# Patient Record
Sex: Female | Born: 1950 | Race: Black or African American | Hispanic: No | Marital: Married | Smoking: Current every day smoker
Health system: Southern US, Community
[De-identification: ages and names within clinical notes are randomized; demographics above are authoritative.]

## PROBLEM LIST (undated history)

## (undated) DIAGNOSIS — J309 Allergic rhinitis, unspecified: Secondary | ICD-10-CM

## (undated) DIAGNOSIS — K219 Gastro-esophageal reflux disease without esophagitis: Secondary | ICD-10-CM

## (undated) DIAGNOSIS — Z720 Tobacco use: Secondary | ICD-10-CM

## (undated) DIAGNOSIS — E785 Hyperlipidemia, unspecified: Secondary | ICD-10-CM

## (undated) DIAGNOSIS — E669 Obesity, unspecified: Secondary | ICD-10-CM

## (undated) DIAGNOSIS — J45909 Unspecified asthma, uncomplicated: Secondary | ICD-10-CM

## (undated) DIAGNOSIS — G5600 Carpal tunnel syndrome, unspecified upper limb: Secondary | ICD-10-CM

## (undated) DIAGNOSIS — I10 Essential (primary) hypertension: Secondary | ICD-10-CM

## (undated) DIAGNOSIS — D696 Thrombocytopenia, unspecified: Secondary | ICD-10-CM

## (undated) HISTORY — DX: Carpal tunnel syndrome, unspecified upper limb: G56.00

## (undated) HISTORY — PX: TUBAL LIGATION: SHX77

## (undated) HISTORY — DX: Tobacco use: Z72.0

## (undated) HISTORY — DX: Obesity, unspecified: E66.9

## (undated) HISTORY — DX: Essential (primary) hypertension: I10

## (undated) HISTORY — DX: Gastro-esophageal reflux disease without esophagitis: K21.9

## (undated) HISTORY — DX: Thrombocytopenia, unspecified: D69.6

## (undated) HISTORY — PX: OTHER SURGICAL HISTORY: SHX169

## (undated) HISTORY — DX: Allergic rhinitis, unspecified: J30.9

## (undated) HISTORY — DX: Hyperlipidemia, unspecified: E78.5

## (undated) HISTORY — DX: Unspecified asthma, uncomplicated: J45.909

---

## 2004-03-01 ENCOUNTER — Ambulatory Visit: Payer: Self-pay | Admitting: Unknown Physician Specialty

## 2004-03-23 ENCOUNTER — Ambulatory Visit: Payer: Self-pay | Admitting: Unknown Physician Specialty

## 2008-09-28 ENCOUNTER — Ambulatory Visit: Payer: Self-pay | Admitting: Unknown Physician Specialty

## 2009-01-26 ENCOUNTER — Emergency Department: Payer: Self-pay | Admitting: Internal Medicine

## 2010-07-13 LAB — CBC AND DIFFERENTIAL
Neutrophils Absolute: 37 /uL
Platelets: 143 10*3/uL — AB (ref 150–399)

## 2010-07-13 LAB — BASIC METABOLIC PANEL: Glucose: 94 mg/dL

## 2010-07-13 LAB — LIPID PANEL
Cholesterol: 194 mg/dL (ref 0–200)
HDL: 46 mg/dL (ref 35–70)
LDL Cholesterol: 127 mg/dL

## 2010-07-13 LAB — TSH: TSH: 1.58 u[IU]/mL (ref 0.41–5.90)

## 2010-08-31 ENCOUNTER — Ambulatory Visit: Payer: Self-pay | Admitting: Unknown Physician Specialty

## 2010-08-31 LAB — HM MAMMOGRAPHY

## 2010-12-12 ENCOUNTER — Ambulatory Visit: Payer: Self-pay | Admitting: Unknown Physician Specialty

## 2010-12-12 LAB — HM COLONOSCOPY

## 2011-05-30 ENCOUNTER — Encounter: Payer: Self-pay | Admitting: Internal Medicine

## 2011-05-30 DIAGNOSIS — Z720 Tobacco use: Secondary | ICD-10-CM

## 2011-05-30 DIAGNOSIS — I10 Essential (primary) hypertension: Secondary | ICD-10-CM

## 2011-05-30 DIAGNOSIS — E785 Hyperlipidemia, unspecified: Secondary | ICD-10-CM

## 2011-05-30 DIAGNOSIS — M722 Plantar fascial fibromatosis: Secondary | ICD-10-CM

## 2011-05-30 DIAGNOSIS — E669 Obesity, unspecified: Secondary | ICD-10-CM

## 2011-05-30 DIAGNOSIS — B351 Tinea unguium: Secondary | ICD-10-CM

## 2011-05-30 HISTORY — DX: Hyperlipidemia, unspecified: E78.5

## 2011-08-02 ENCOUNTER — Ambulatory Visit (INDEPENDENT_AMBULATORY_CARE_PROVIDER_SITE_OTHER): Payer: BC Managed Care – PPO | Admitting: Internal Medicine

## 2011-08-02 ENCOUNTER — Encounter: Payer: Self-pay | Admitting: Internal Medicine

## 2011-08-02 VITALS — BP 138/78 | HR 92 | Temp 98.3°F | Wt 239.0 lb

## 2011-08-02 DIAGNOSIS — I10 Essential (primary) hypertension: Secondary | ICD-10-CM | POA: Insufficient documentation

## 2011-08-02 DIAGNOSIS — L259 Unspecified contact dermatitis, unspecified cause: Secondary | ICD-10-CM

## 2011-08-02 DIAGNOSIS — L309 Dermatitis, unspecified: Secondary | ICD-10-CM | POA: Insufficient documentation

## 2011-08-02 DIAGNOSIS — Z720 Tobacco use: Secondary | ICD-10-CM | POA: Insufficient documentation

## 2011-08-02 DIAGNOSIS — E785 Hyperlipidemia, unspecified: Secondary | ICD-10-CM

## 2011-08-02 DIAGNOSIS — F17200 Nicotine dependence, unspecified, uncomplicated: Secondary | ICD-10-CM

## 2011-08-02 DIAGNOSIS — Z0000 Encounter for general adult medical examination without abnormal findings: Secondary | ICD-10-CM

## 2011-08-02 LAB — CBC WITH DIFFERENTIAL/PLATELET
Basophils Absolute: 0 10*3/uL (ref 0.0–0.1)
Basophils Relative: 0.7 % (ref 0.0–3.0)
Eosinophils Absolute: 0.3 10*3/uL (ref 0.0–0.7)
Hemoglobin: 13.9 g/dL (ref 12.0–15.0)
Lymphocytes Relative: 33.3 % (ref 12.0–46.0)
MCHC: 32.3 g/dL (ref 30.0–36.0)
Monocytes Relative: 10.4 % (ref 3.0–12.0)
Neutro Abs: 2.5 10*3/uL (ref 1.4–7.7)
Neutrophils Relative %: 50.2 % (ref 43.0–77.0)
RBC: 5.01 Mil/uL (ref 3.87–5.11)

## 2011-08-02 LAB — POCT URINALYSIS DIPSTICK
Bilirubin, UA: NEGATIVE
Blood, UA: NEGATIVE
Ketones, UA: NEGATIVE
Protein, UA: NEGATIVE
pH, UA: 6

## 2011-08-02 LAB — COMPREHENSIVE METABOLIC PANEL
AST: 36 U/L (ref 0–37)
Albumin: 4 g/dL (ref 3.5–5.2)
BUN: 15 mg/dL (ref 6–23)
CO2: 24 mEq/L (ref 19–32)
Calcium: 9.6 mg/dL (ref 8.4–10.5)
Chloride: 108 mEq/L (ref 96–112)
Creatinine, Ser: 0.9 mg/dL (ref 0.4–1.2)
GFR: 83.57 mL/min (ref >60.00)
Glucose, Bld: 92 mg/dL (ref 70–99)
Potassium: 4.1 mEq/L (ref 3.5–5.1)

## 2011-08-02 LAB — LIPID PANEL
HDL: 61.4 mg/dL (ref >39.00)
Triglycerides: 94 mg/dL (ref 0.0–149.0)
VLDL: 18.8 mg/dL (ref 0.0–40.0)

## 2011-08-02 LAB — TSH: TSH: 1.61 u[IU]/mL (ref 0.35–5.50)

## 2011-08-02 MED ORDER — BUPROPION HCL ER (XL) 150 MG PO TB24: 150.0000 mg | ORAL_TABLET | Freq: Every day | ORAL | Status: DC

## 2011-08-02 MED ORDER — TRIAMCINOLONE ACETONIDE 0.1 % EX CREA: TOPICAL_CREAM | Freq: Two times a day (BID) | CUTANEOUS | Status: DC

## 2011-08-02 NOTE — Progress Notes (Signed)
Addended by: Jobie Quaker on: 08/02/2011 04:32 PM   Modules accepted: Orders

## 2011-08-02 NOTE — Assessment & Plan Note (Signed)
Noted in past by previous provider. Will check lipids and LFTs today.

## 2011-08-02 NOTE — Assessment & Plan Note (Signed)
Located over bilateral arms, associated with use of new lotion. Will stop lotion and use triamcinolone cream bid for next week to see if any improvement. Pt may also use Benadryl at night for itching. She will call if symptoms not improved.

## 2011-08-02 NOTE — Assessment & Plan Note (Signed)
BP well controlled today. Will check renal function and urine microalbumin with labs. Follow up BP in 6 months.

## 2011-08-02 NOTE — Progress Notes (Signed)
Subjective:    Patient ID: Bethany Lopez, female    DOB: Sep 28, 1950, 61 y.o.   MRN: [id number]  HPI 61 year old female with history of hypertension, allergic rhinitis, and tobacco use presents to establish care. In regards to her hypertension, she reports that she recently discontinued her triamterene-hctz because she felt that this medication increased swelling in her lower extremities. However, she has continued taking amlodipine. She denies any noted side effects from this medicine. She denies any chest pain, headache. She has been checking her blood pressure but feels that her home blood pressure cuff is inaccurate as it has been giving her some variable readings.  She notes that she has been trying to cut back on use of cigarettes. She is currently smoking less than half a pack of cigarettes per day. She notes that she continues to use cigarettes to help with anxiety or stress. She is interested in learning new ways to stop smoking.  She is concerned today about a rash that she developed approximately one week ago over her arms and upper torso. She notes that she has been using a new lotion and these areas in questions if this lotion may be contributing to the rash the rash is described as red and bumpy. It is itchy. She has not been applying anything to the rash or taking any medications by mouth for this. She denies any fever, chills, or rashes other locations.  Outpatient Encounter Prescriptions as of 08/02/2011  Medication Sig Dispense Refill  . amLODipine (NORVASC) 5 MG tablet Take 5 mg by mouth daily.      . fluticasone (FLONASE) 50 MCG/ACT nasal spray Place 1 spray into the nose 2 (two) times daily.      Marland Kitchen buPROPion (WELLBUTRIN XL) 150 MG 24 hr tablet Take 1 tablet (150 mg total) by mouth daily.  30 tablet  3  . triamcinolone cream (KENALOG) 0.1 % Apply topically 2 (two) times daily.  30 g  0  . DISCONTD: triamterene-hydrochlorothiazide (DYAZIDE) 37.5-25 MG per capsule Take 1 capsule by  mouth every morning.        Review of Systems  Constitutional: Negative for fever, chills, appetite change, fatigue and unexpected weight change.  HENT: Negative for ear pain, congestion, sore throat, trouble swallowing, neck pain, voice change and sinus pressure.   Eyes: Negative for visual disturbance.  Respiratory: Negative for cough, shortness of breath, wheezing and stridor.   Cardiovascular: Negative for chest pain, palpitations and leg swelling.  Gastrointestinal: Negative for nausea, vomiting, abdominal pain, diarrhea, constipation, blood in stool, abdominal distention and anal bleeding.  Genitourinary: Negative for dysuria and flank pain.  Musculoskeletal: Negative for myalgias, arthralgias and gait problem.  Skin: Positive for color change and rash.  Neurological: Negative for dizziness and headaches.  Hematological: Negative for adenopathy. Does not bruise/bleed easily.  Psychiatric/Behavioral: Negative for suicidal ideas, sleep disturbance and dysphoric mood. The patient is not nervous/anxious.    BP 138/78  Pulse 92  Temp(Src) 98.3 F (36.8 C) (Oral)  Wt 239 lb (108.41 kg)  SpO2 98%     Objective:   Physical Exam  Constitutional: She is oriented to person, place, and time. She appears well-developed and well-nourished. No distress.  HENT:  Head: Normocephalic and atraumatic.  Right Ear: External ear normal.  Left Ear: External ear normal.  Nose: Nose normal.  Mouth/Throat: Oropharynx is clear and moist. No oropharyngeal exudate.  Eyes: Conjunctivae are normal. Pupils are equal, round, and reactive to light. Right eye exhibits no discharge. Left  eye exhibits no discharge. No scleral icterus.  Neck: Normal range of motion. Neck supple. No tracheal deviation present. No thyromegaly present.  Cardiovascular: Normal rate, regular rhythm, normal heart sounds and intact distal pulses.  Exam reveals no gallop and no friction rub.   No murmur heard. Pulmonary/Chest: Effort  normal and breath sounds normal. No respiratory distress. She has no wheezes. She has no rales. She exhibits no tenderness.  Abdominal: Soft. Bowel sounds are normal. She exhibits no distension. There is no tenderness.  Musculoskeletal: Normal range of motion. She exhibits no edema and no tenderness.  Lymphadenopathy:    She has no cervical adenopathy.  Neurological: She is alert and oriented to person, place, and time. No cranial nerve deficit. She exhibits normal muscle tone. Coordination normal.  Skin: Skin is warm and dry. Rash (over bilateral arms, upper back and upper anterior chest) noted. Rash is papular. She is not diaphoretic. No erythema. No pallor.  Psychiatric: She has a normal mood and affect. Her behavior is normal. Judgment and thought content normal.          Assessment & Plan:

## 2011-08-02 NOTE — Assessment & Plan Note (Signed)
Pt has been reducing number of cigarettes used daily. Will add wellbutrin and set quit date in 1 week.  Will use nicotine gum as needed. Follow up 1 month.

## 2011-08-03 ENCOUNTER — Other Ambulatory Visit: Payer: Self-pay | Admitting: *Deleted

## 2011-08-03 MED ORDER — ATORVASTATIN CALCIUM 20 MG PO TABS: 20.0000 mg | ORAL_TABLET | Freq: Every day | ORAL | Status: DC

## 2011-08-23 ENCOUNTER — Telehealth: Payer: Self-pay | Admitting: Internal Medicine

## 2011-08-23 DIAGNOSIS — L309 Dermatitis, unspecified: Secondary | ICD-10-CM

## 2011-08-23 MED ORDER — TRIAMCINOLONE ACETONIDE 0.1 % EX CREA: TOPICAL_CREAM | Freq: Two times a day (BID) | CUTANEOUS | Status: DC

## 2011-08-23 NOTE — Telephone Encounter (Signed)
Done

## 2011-08-23 NOTE — Telephone Encounter (Signed)
[phone] Pt would like refill on triamcinolone cream [facility] @ [address]

## 2011-09-06 ENCOUNTER — Other Ambulatory Visit: Payer: BC Managed Care – PPO

## 2011-09-07 ENCOUNTER — Other Ambulatory Visit: Payer: Self-pay | Admitting: *Deleted

## 2011-09-07 ENCOUNTER — Other Ambulatory Visit (INDEPENDENT_AMBULATORY_CARE_PROVIDER_SITE_OTHER): Payer: BC Managed Care – PPO

## 2011-09-07 DIAGNOSIS — I10 Essential (primary) hypertension: Secondary | ICD-10-CM

## 2011-09-07 DIAGNOSIS — E785 Hyperlipidemia, unspecified: Secondary | ICD-10-CM

## 2011-09-07 LAB — COMPREHENSIVE METABOLIC PANEL
ALT: 33 U/L (ref 0–35)
Albumin: 3.6 g/dL (ref 3.5–5.2)
Alkaline Phosphatase: 49 U/L (ref 39–117)
CO2: 24 mEq/L (ref 19–32)
Potassium: 4.5 mEq/L (ref 3.5–5.1)
Sodium: 139 mEq/L (ref 135–145)
Total Bilirubin: 0.4 mg/dL (ref 0.3–1.2)
Total Protein: 6.7 g/dL (ref 6.0–8.3)

## 2011-09-07 LAB — CBC WITH DIFFERENTIAL/PLATELET
Basophils Relative: 0.1 % (ref 0.0–3.0)
Eosinophils Absolute: 0.3 10*3/uL (ref 0.0–0.7)
Hemoglobin: 13.8 g/dL (ref 12.0–15.0)
Lymphs Abs: 1.8 10*3/uL (ref 0.7–4.0)
MCHC: 31.7 g/dL (ref 30.0–36.0)
MCV: 83.9 fl (ref 78.0–100.0)
Monocytes Absolute: 0.4 10*3/uL (ref 0.1–1.0)
Neutro Abs: 2.4 10*3/uL (ref 1.4–7.7)
RBC: 5.19 Mil/uL — ABNORMAL HIGH (ref 3.87–5.11)
RDW: 14.6 % (ref 11.5–14.6)

## 2011-09-07 LAB — LIPID PANEL
HDL: 61.7 mg/dL (ref >39.00)
Total CHOL/HDL Ratio: 2

## 2011-09-07 NOTE — Telephone Encounter (Signed)
Informed patient/SLS 

## 2011-09-07 NOTE — Telephone Encounter (Signed)
There is not really a stronger topical steroid that would be safe for her to use over her arms for the rash.  If the rash is persistent, then we should set her up with dermatology

## 2011-09-07 NOTE — Telephone Encounter (Signed)
Fyi pt states the pain in her side has gotten better, it felt more like a pulled muscle and it a lot better. The rash is slowly going away pt is on her second refill of med, she wants to know if she can get a stronger dose. The med does help but it is taking a while.

## 2011-10-24 ENCOUNTER — Other Ambulatory Visit: Payer: Self-pay | Admitting: Internal Medicine

## 2011-12-01 ENCOUNTER — Encounter: Payer: Self-pay | Admitting: Internal Medicine

## 2011-12-01 DIAGNOSIS — Z0001 Encounter for general adult medical examination with abnormal findings: Secondary | ICD-10-CM | POA: Insufficient documentation

## 2011-12-06 ENCOUNTER — Encounter: Payer: Self-pay | Admitting: Internal Medicine

## 2011-12-06 ENCOUNTER — Other Ambulatory Visit (INDEPENDENT_AMBULATORY_CARE_PROVIDER_SITE_OTHER): Payer: BC Managed Care – PPO

## 2011-12-06 ENCOUNTER — Ambulatory Visit (INDEPENDENT_AMBULATORY_CARE_PROVIDER_SITE_OTHER): Payer: BC Managed Care – PPO | Admitting: Internal Medicine

## 2011-12-06 VITALS — BP 140/80 | HR 82 | Temp 97.4°F | Ht 65.0 in | Wt 238.5 lb

## 2011-12-06 DIAGNOSIS — F17200 Nicotine dependence, unspecified, uncomplicated: Secondary | ICD-10-CM

## 2011-12-06 DIAGNOSIS — I10 Essential (primary) hypertension: Secondary | ICD-10-CM

## 2011-12-06 DIAGNOSIS — Z23 Encounter for immunization: Secondary | ICD-10-CM

## 2011-12-06 DIAGNOSIS — L259 Unspecified contact dermatitis, unspecified cause: Secondary | ICD-10-CM

## 2011-12-06 DIAGNOSIS — Z0000 Encounter for general adult medical examination without abnormal findings: Secondary | ICD-10-CM

## 2011-12-06 DIAGNOSIS — Z720 Tobacco use: Secondary | ICD-10-CM

## 2011-12-06 DIAGNOSIS — L309 Dermatitis, unspecified: Secondary | ICD-10-CM

## 2011-12-06 DIAGNOSIS — R21 Rash and other nonspecific skin eruption: Secondary | ICD-10-CM

## 2011-12-06 LAB — CBC WITH DIFFERENTIAL/PLATELET
Basophils Absolute: 0 10*3/uL (ref 0.0–0.1)
Eosinophils Absolute: 0.5 10*3/uL (ref 0.0–0.7)
Lymphocytes Relative: 33.3 % (ref 12.0–46.0)
MCHC: 31.3 g/dL (ref 30.0–36.0)
Monocytes Absolute: 0.6 10*3/uL (ref 0.1–1.0)
Neutrophils Relative %: 45 % (ref 43.0–77.0)
Platelets: 149 10*3/uL — ABNORMAL LOW (ref 150.0–400.0)
RBC: 4.2 Mil/uL (ref 3.87–5.11)
RDW: 16.4 % — ABNORMAL HIGH (ref 11.5–14.6)

## 2011-12-06 LAB — URINALYSIS, ROUTINE W REFLEX MICROSCOPIC
Bilirubin Urine: NEGATIVE
Hgb urine dipstick: NEGATIVE
Nitrite: NEGATIVE
Urobilinogen, UA: 0.2 (ref 0.0–1.0)

## 2011-12-06 LAB — BASIC METABOLIC PANEL
CO2: 27 mEq/L (ref 19–32)
Chloride: 106 mEq/L (ref 96–112)
Sodium: 138 mEq/L (ref 135–145)

## 2011-12-06 LAB — HEPATIC FUNCTION PANEL
ALT: 30 U/L (ref 0–35)
AST: 29 U/L (ref 0–37)
Alkaline Phosphatase: 49 U/L (ref 39–117)
Bilirubin, Direct: 0.1 mg/dL (ref 0.0–0.3)
Total Protein: 6.5 g/dL (ref 6.0–8.3)

## 2011-12-06 LAB — LIPID PANEL: Total CHOL/HDL Ratio: 2

## 2011-12-06 LAB — TSH: TSH: 1.05 u[IU]/mL (ref 0.35–5.50)

## 2011-12-06 MED ORDER — TETANUS-DIPHTH-ACELL PERTUSSIS 5-2.5-18.5 LF-MCG/0.5 IM SUSP: 0.5000 mL | Freq: Once | INTRAMUSCULAR | Status: DC

## 2011-12-06 MED ORDER — TRIAMCINOLONE ACETONIDE 0.1 % EX CREA: TOPICAL_CREAM | Freq: Two times a day (BID) | CUTANEOUS | Status: DC

## 2011-12-06 NOTE — Assessment & Plan Note (Signed)

## 2011-12-06 NOTE — Patient Instructions (Addendum)
Take all new medications as prescribed - the cream You had the tetanus shot today Please go to LAB in the Basement for the blood and/or urine tests to be done today You will be contacted by phone if any changes need to be made immediately.  Otherwise, you will receive a letter about your results with an explanation. Please continue your efforts at being more active, low cholesterol diet, and weight control. Please stop smoking Please have the pharmacy call with any refills you may need. Please return in 1 year for your yearly visit, or sooner if needed, with Lab testing done 3-5 days before

## 2011-12-06 NOTE — Assessment & Plan Note (Signed)
Urged to quit, not ready, declines chantix or similar ar this time

## 2011-12-06 NOTE — Assessment & Plan Note (Signed)
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

## 2011-12-06 NOTE — Assessment & Plan Note (Signed)
stable overall by hx and exam, most recent data reviewed with pt, and pt to continue medical treatment as before BP Readings from Last 3 Encounters:  12/06/11 140/80  08/02/11 138/78  03/29/11 150/90

## 2011-12-06 NOTE — Progress Notes (Signed)
Subjective:    Patient ID: Bethany Lopez, female    DOB: November 12, 1950, 61 y.o.   MRN: [id number]  HPI  Here for wellness and to establish as new pt;  Overall doing ok;  Pt denies CP, worsening SOB, DOE, wheezing, orthopnea, PND, worsening LE edema, palpitations, dizziness or syncope.  Pt denies neurological change such as new Headache, facial or extremity weakness.  Pt denies polydipsia, polyuria, or low sugar symptoms. Pt states overall good compliance with treatment and medications, good tolerability, and trying to follow lower cholesterol diet.  Pt denies worsening depressive symptoms, suicidal ideation or panic. No fever, wt loss, night sweats, loss of appetite, or other constitutional symptoms.  Pt states good ability with ADL's, low fall risk, home safety reviewed and adequate, no significant changes in hearing or vision, and occasionally active with exercise - plans to try to do better.  Currently working at [employer], no plans to retire soon.  Due for tetanus and routine labs. 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 Past Medical History  Diagnosis Date  . Hypertension   . Obesity   . Tobacco abuse   . Hyperlipidemia 05/30/2011    NEC/NOS    . Asthma    Past Surgical History  Procedure Date  . Arthroscopic right knee     5-6 yrs ago  . Tubal ligation     reports that she has been smoking Cigarettes.  She has been smoking about .5 packs per day. She has never used smokeless tobacco. She reports that she drinks alcohol. She reports that she does not use illicit drugs. family history includes Diabetes in her brother, mother, and sister; Heart disease in her father; and Kidney disease in her mother. Allergies  Allergen Reactions  . Lodine (Etodolac) Other (See Comments)    GI upset   Current Outpatient Prescriptions on File Prior to Visit  Medication Sig Dispense Refill  .  amLODipine (NORVASC) 5 MG tablet Take 5 mg by mouth daily.      Marland Kitchen atorvastatin (LIPITOR) 20 MG tablet TAKE ONE TABLET BY MOUTH EVERY DAY  30 tablet  3  . buPROPion (WELLBUTRIN XL) 150 MG 24 hr tablet Take 1 tablet (150 mg total) by mouth daily.  30 tablet  3  . fluticasone (FLONASE) 50 MCG/ACT nasal spray Place 1 spray into the nose 2 (two) times daily.       No current facility-administered medications on file prior to visit.   Review of Systems Review of Systems  Constitutional: Negative for diaphoresis, activity change, appetite change and unexpected weight change.  HENT: Negative for hearing loss, ear pain, facial swelling, mouth sores and neck stiffness.   Eyes: Negative for pain, redness and visual disturbance.  Respiratory: Negative for shortness of breath and wheezing.   Cardiovascular: Negative for chest pain and palpitations.  Gastrointestinal: Negative for diarrhea, blood in stool, abdominal distention and rectal pain.  Genitourinary: Negative for hematuria, flank pain and decreased urine volume.  Musculoskeletal: Negative for myalgias and joint swelling.  Skin: Negative for color change and wound.  Neurological: Negative for syncope and numbness.  Hematological: Negative for adenopathy.  Psychiatric/Behavioral: Negative for hallucinations, self-injury, decreased concentration and agitation.      Objective:   Physical Exam BP 140/80  Pulse 82  Temp 97.4 F (36.3 C) (Oral)  Ht 5\' 5"  (1.651 m)  Wt 238 lb 8 oz (108.183  kg)  BMI 39.69 kg/m2  SpO2 98% Physical Exam  VS noted Constitutional: Pt is oriented to person, place, and time. Appears well-developed and well-nourished.  HENT:  Head: Normocephalic and atraumatic.  Right Ear: External ear normal.  Left Ear: External ear normal.  Nose: Nose normal.  Mouth/Throat: Oropharynx is clear and moist.  Eyes: Conjunctivae and EOM are normal. Pupils are equal, round, and reactive to light.  Neck: Normal range of motion. Neck  supple. No JVD present. No tracheal deviation present.  Cardiovascular: Normal rate, regular rhythm, normal heart sounds and intact distal pulses.   Pulmonary/Chest: Effort normal and breath sounds normal.  Abdominal: Soft. Bowel sounds are normal. There is no tenderness.  Musculoskeletal: Normal range of motion. Exhibits no edema.  Lymphadenopathy:  Has no cervical adenopathy.  Neurological: Pt is alert and oriented to person, place, and time. Pt has normal reflexes. No cranial nerve deficit.  Skin: Skin is warm and dry. No rash noted. excpet for minor tender erythema bumpy rash to the extensor surfaces both arms Psychiatric:  Has  normal mood and affect. Behavior is normal.     Assessment & Plan:

## 2011-12-07 ENCOUNTER — Encounter: Payer: Self-pay | Admitting: Internal Medicine

## 2011-12-07 LAB — IBC PANEL: Saturation Ratios: 20.5 % (ref 20.0–50.0)

## 2011-12-10 ENCOUNTER — Encounter: Payer: Self-pay | Admitting: Internal Medicine

## 2011-12-21 ENCOUNTER — Other Ambulatory Visit: Payer: Self-pay | Admitting: Internal Medicine

## 2012-02-28 ENCOUNTER — Other Ambulatory Visit: Payer: Self-pay | Admitting: Internal Medicine

## 2012-03-01 ENCOUNTER — Other Ambulatory Visit: Payer: Self-pay | Admitting: *Deleted

## 2012-03-01 MED ORDER — ATORVASTATIN CALCIUM 20 MG PO TABS: ORAL_TABLET | ORAL | Status: DC

## 2012-03-01 NOTE — Telephone Encounter (Signed)
R'cd fax from [facility] pharmacy for refill of Liptor.

## 2012-03-22 ENCOUNTER — Other Ambulatory Visit: Payer: Self-pay

## 2012-03-22 MED ORDER — AMLODIPINE BESYLATE 5 MG PO TABS: 5.0000 mg | ORAL_TABLET | Freq: Every day | ORAL | Status: DC

## 2012-04-25 ENCOUNTER — Ambulatory Visit: Payer: Self-pay | Admitting: Internal Medicine

## 2012-04-30 ENCOUNTER — Ambulatory Visit (INDEPENDENT_AMBULATORY_CARE_PROVIDER_SITE_OTHER): Payer: BC Managed Care – PPO | Admitting: Internal Medicine

## 2012-04-30 ENCOUNTER — Encounter: Payer: Self-pay | Admitting: Internal Medicine

## 2012-04-30 ENCOUNTER — Other Ambulatory Visit (INDEPENDENT_AMBULATORY_CARE_PROVIDER_SITE_OTHER): Payer: BC Managed Care – PPO

## 2012-04-30 VITALS — BP 116/68 | HR 86 | Temp 97.9°F | Ht 65.0 in | Wt 240.4 lb

## 2012-04-30 DIAGNOSIS — I10 Essential (primary) hypertension: Secondary | ICD-10-CM

## 2012-04-30 DIAGNOSIS — Z23 Encounter for immunization: Secondary | ICD-10-CM

## 2012-04-30 DIAGNOSIS — L259 Unspecified contact dermatitis, unspecified cause: Secondary | ICD-10-CM

## 2012-04-30 DIAGNOSIS — Z2911 Encounter for prophylactic immunotherapy for respiratory syncytial virus (RSV): Secondary | ICD-10-CM

## 2012-04-30 DIAGNOSIS — E669 Obesity, unspecified: Secondary | ICD-10-CM

## 2012-04-30 DIAGNOSIS — D649 Anemia, unspecified: Secondary | ICD-10-CM | POA: Insufficient documentation

## 2012-04-30 DIAGNOSIS — D509 Iron deficiency anemia, unspecified: Secondary | ICD-10-CM | POA: Insufficient documentation

## 2012-04-30 DIAGNOSIS — L309 Dermatitis, unspecified: Secondary | ICD-10-CM

## 2012-04-30 DIAGNOSIS — Z0000 Encounter for general adult medical examination without abnormal findings: Secondary | ICD-10-CM

## 2012-04-30 DIAGNOSIS — J309 Allergic rhinitis, unspecified: Secondary | ICD-10-CM

## 2012-04-30 DIAGNOSIS — J209 Acute bronchitis, unspecified: Secondary | ICD-10-CM

## 2012-04-30 LAB — CBC WITH DIFFERENTIAL/PLATELET
Basophils Relative: 1.4 % (ref 0.0–3.0)
Eosinophils Absolute: 0.3 10*3/uL (ref 0.0–0.7)
Eosinophils Relative: 4.8 % (ref 0.0–5.0)
HCT: 25.8 % — ABNORMAL LOW (ref 36.0–46.0)
Hemoglobin: 8.1 g/dL — ABNORMAL LOW (ref 12.0–15.0)
Lymphs Abs: 1.5 10*3/uL (ref 0.7–4.0)
MCHC: 31.6 g/dL (ref 30.0–36.0)
MCV: 87.4 fl (ref 78.0–100.0)
Monocytes Absolute: 0.6 10*3/uL (ref 0.1–1.0)
Neutro Abs: 3.2 10*3/uL (ref 1.4–7.7)
RBC: 2.95 Mil/uL — ABNORMAL LOW (ref 3.87–5.11)

## 2012-04-30 MED ORDER — FLUTICASONE PROPIONATE 50 MCG/ACT NA SUSP: 1.0000 | Freq: Two times a day (BID) | NASAL | Status: DC

## 2012-04-30 MED ORDER — PHENTERMINE HCL 37.5 MG PO CAPS: 37.5000 mg | ORAL_CAPSULE | ORAL | Status: DC

## 2012-04-30 MED ORDER — AZITHROMYCIN 250 MG PO TABS: ORAL_TABLET | ORAL | Status: DC

## 2012-04-30 MED ORDER — HYDROXYZINE HCL 10 MG PO TABS: ORAL_TABLET | ORAL | Status: DC

## 2012-04-30 NOTE — Assessment & Plan Note (Signed)
?   Clinical signficance last visit, for f/u lab today Lab Results  Component Value Date   WBC 5.3 12/06/2011   HGB 11.5* 12/06/2011   HCT 36.9 12/06/2011   MCV 87.7 12/06/2011   PLT 149.0* 12/06/2011

## 2012-04-30 NOTE — Patient Instructions (Addendum)
Your EKG was OK today You had the shingles shot today Please take all new medication as prescribed - the phentermine, and the generic atarax for itching, and antibiotic Please continue all other medications as before, including the flonase Please go to the LAB in the Basement (turn left off the elevator) for the tests to be done today You will be contacted by phone if any changes need to be made immediately.  Otherwise, you will receive a letter about your results with an explanation Please remember to sign up for My Chart if you have not done so, as this will be important to you in the future with finding out test results, communicating by private email, and scheduling acute appointments online when needed. You will be contacted regarding the referral for: dermatology in [place] Please return in 1 year for your yearly visit, or sooner if needed, with Lab testing done 3-5 days before

## 2012-04-30 NOTE — Assessment & Plan Note (Addendum)
ECG reviewed as per emr, stable overall by history and exam, recent data reviewed with pt, and pt to continue medical treatment as before,  to f/u any worsening symptoms or concerns  

## 2012-05-01 ENCOUNTER — Encounter: Payer: Self-pay | Admitting: Internal Medicine

## 2012-05-01 DIAGNOSIS — J209 Acute bronchitis, unspecified: Secondary | ICD-10-CM | POA: Insufficient documentation

## 2012-05-01 DIAGNOSIS — J309 Allergic rhinitis, unspecified: Secondary | ICD-10-CM | POA: Insufficient documentation

## 2012-05-01 DIAGNOSIS — E669 Obesity, unspecified: Secondary | ICD-10-CM | POA: Insufficient documentation

## 2012-05-01 HISTORY — DX: Allergic rhinitis, unspecified: J30.9

## 2012-05-01 NOTE — Assessment & Plan Note (Signed)
Ok for limited phentermine trial, for excericise, calorie decrease

## 2012-05-01 NOTE — Assessment & Plan Note (Signed)
Mild to mod, for asd flonase asd,  to f/u any worsening symptoms or concerns

## 2012-05-01 NOTE — Assessment & Plan Note (Signed)
For triam cr, refer derm per pt request

## 2012-05-01 NOTE — Assessment & Plan Note (Signed)
Mild to mod, for antibx course,  to f/u any worsening symptoms or concerns 

## 2012-05-01 NOTE — Progress Notes (Signed)
Subjective:    Patient ID: Bethany Lopez, female    DOB: 04-04-1951, 62 y.o.   MRN: [id number]  HPI  Here with acute onset mild to mod 2-3 days ST, HA, general weakness and malaise, with prod cough greenish sputum, but Pt denies chest pain, increased sob or doe, wheezing, orthopnea, PND, increased LE swelling, palpitations, dizziness or syncope.  Also - Does have several wks ongoing nasal allergy symptoms with clearish congestion, itch and sneezing, without fever, pain, ST, cough, swelling.  No recent overt bleeding or bruising.  Pt denies new neurological symptoms such as new headache, or facial or extremity weakness or numbness   Pt denies polydipsia, polyuria.  Has a rash with itch to right forearm, tx not working.  Itch really bothers her, asks for derm referral.  Hard to lose wt, asks for phentermine as a friend did well with it.   Past Medical History  Diagnosis Date  . Hypertension   . Obesity   . Tobacco abuse   . Hyperlipidemia 05/30/2011    NEC/NOS    . Asthma    Past Surgical History  Procedure Date  . Arthroscopic right knee     5-6 yrs ago  . Tubal ligation     reports that she has been smoking Cigarettes.  She has been smoking about .5 packs per day. She has never used smokeless tobacco. She reports that she drinks alcohol. She reports that she does not use illicit drugs. family history includes Diabetes in her brother, mother, and sister; Heart disease in her father; and Kidney disease in her mother. Allergies  Allergen Reactions  . Lodine (Etodolac) Other (See Comments)    GI upset   Current Outpatient Prescriptions on File Prior to Visit  Medication Sig Dispense Refill  . amLODipine (NORVASC) 5 MG tablet Take 1 tablet (5 mg total) by mouth daily.  30 tablet  11  . atorvastatin (LIPITOR) 20 MG tablet TAKE ONE TABLET BY MOUTH EVERY DAY  30 tablet  5  . b complex vitamins tablet Take 1 tablet by mouth daily.      Marland Kitchen buPROPion (WELLBUTRIN XL) 150 MG 24 hr tablet Take 1  tablet (150 mg total) by mouth daily.  30 tablet  3  . fluticasone (FLONASE) 50 MCG/ACT nasal spray Place 1 spray into the nose 2 (two) times daily.  16 g  2  . Multiple Vitamin (MULTIVITAMIN) tablet Take 1 tablet by mouth daily.      Marland Kitchen triamcinolone cream (KENALOG) 0.1 % APPLY TO AFFECTED AREA TWICE DAILY  30 g  1  . phentermine 37.5 MG capsule Take 1 capsule (37.5 mg total) by mouth every morning.  30 capsule  2   Review of Systems  Constitutional: Negative for unexpected weight change, or unusual diaphoresis  HENT: Negative for tinnitus.   Eyes: Negative for photophobia and visual disturbance.  Respiratory: Negative for choking and stridor.   Gastrointestinal: Negative for vomiting and blood in stool.  Genitourinary: Negative for hematuria and decreased urine volume.  Musculoskeletal: Negative for acute joint swelling Skin: Negative for color change and wound.  Neurological: Negative for tremors and numbness other than noted  Psychiatric/Behavioral: Negative for decreased concentration or  hyperactivity.       Objective:   Physical Exam BP 116/68  Pulse 86  Temp 97.9 F (36.6 C) (Oral)  Ht 5\' 5"  (1.651 m)  Wt 240 lb 6 oz (109.033 kg)  BMI 40.00 kg/m2  SpO2 99% VS noted, mild ill Constitutional:  Pt appears well-developed and well-nourished.  HENT: Head: NCAT.  Right Ear: External ear normal.  Left Ear: External ear normal.  Bilat tm's with mild erythema.  Max sinus areas mild tender.  Pharynx with mild erythema, no exudate Eyes: Conjunctivae and EOM are normal. Pupils are equal, round, and reactive to light.  Neck: Normal range of motion. Neck supple.  Cardiovascular: Normal rate and regular rhythm.   Pulmonary/Chest: Effort normal and breath sounds normal.  Neurological: Pt is alert. Not confused  Skin: Skin is warm. No erythema. except for 3 cm area mild erythema, nontender to left arm prox to wrist, no swelling Psychiatric: Pt behavior is normal. Thought content normal.       Assessment & Plan:

## 2012-05-02 ENCOUNTER — Encounter: Payer: Self-pay | Admitting: Internal Medicine

## 2012-05-02 ENCOUNTER — Ambulatory Visit (INDEPENDENT_AMBULATORY_CARE_PROVIDER_SITE_OTHER): Payer: BC Managed Care – PPO | Admitting: Internal Medicine

## 2012-05-02 ENCOUNTER — Telehealth: Payer: Self-pay | Admitting: *Deleted

## 2012-05-02 VITALS — BP 138/80 | HR 95 | Temp 97.9°F | Ht 65.0 in | Wt 240.5 lb

## 2012-05-02 DIAGNOSIS — R5383 Other fatigue: Secondary | ICD-10-CM | POA: Insufficient documentation

## 2012-05-02 DIAGNOSIS — I10 Essential (primary) hypertension: Secondary | ICD-10-CM

## 2012-05-02 DIAGNOSIS — R5381 Other malaise: Secondary | ICD-10-CM

## 2012-05-02 DIAGNOSIS — R195 Other fecal abnormalities: Secondary | ICD-10-CM | POA: Insufficient documentation

## 2012-05-02 DIAGNOSIS — D649 Anemia, unspecified: Secondary | ICD-10-CM

## 2012-05-02 NOTE — Telephone Encounter (Signed)
Dr. Jonny Ruiz came up to our floor and asked Doug Sou PA-C if she could see a patient either tomorrow or early next week.  Shanda Bumps told me to schedule her for Monday 05-06-2012 at 9:30 Am.  I called down to Primary Care and gave Zella Ball, Dr. Raphael Gibney nurse this information.  She gave this info to the patient.  I advised her to tell the pt to arrive at 9:15 Am to fill out new paperwork.

## 2012-05-02 NOTE — Assessment & Plan Note (Signed)
liekly related to anemia, exam o/w benign,  to f/u any worsening symptoms or concerns

## 2012-05-02 NOTE — Assessment & Plan Note (Signed)
stable overall by history and exam, recent data reviewed with pt, and pt to continue medical treatment as before,  to f/u any worsening symptoms or concerns BP Readings from Last 3 Encounters:  05/02/12 138/80  04/30/12 116/68  12/06/11 140/80

## 2012-05-02 NOTE — Assessment & Plan Note (Signed)
Also for GI referral, spoke to NP with GI who will see soon, pt cannot recall name of GI previously but will try to ask husband or prior PCP and bring name with her to GI appt

## 2012-05-02 NOTE — Patient Instructions (Addendum)
You are "heme positive" today, indicating blood loss from the GI tract is going on Please start iron sulfate OTC - 1 pill three times per day to start, with a stool softner such as colace 100 mg twice per day as well You can also take Miralax OTC daily if constipation occurs Please go to the LAB in the Basement (turn left off the elevator) for the tests to be done today You will be contacted by phone if any changes need to be made immediately.  Otherwise, you will receive a letter about your results with an explanation Please remember to sign up for My Chart if you have not done so, as this will be important to you in the future with finding out test results, communicating by private email, and scheduling acute appointments online when needed. You will be contacted regarding the referral for: Gastroenterology (the office will call, for possible Appointment for tomorrow or Monday) For now, please do not take the phentermine for weight loss Please try to recall the name of the Gastroenterologist near [place] so that you can let the GI here know at your visit with them

## 2012-05-02 NOTE — Assessment & Plan Note (Addendum)
Normocytic, but may represent more recent bleed vs other, for f/u labs today but presumed iron def - for iron so4 tid, colace

## 2012-05-02 NOTE — Progress Notes (Signed)
Subjective:    Patient ID: Bethany Lopez, female    DOB: 1950-05-07, 62 y.o.   MRN: [id number]  HPI here to f/u, has recent finding of anemia after last visit, today c/o general fatigue but Pt denies chest pain, increased sob or doe, wheezing, orthopnea, PND, increased LE swelling, palpitations, dizziness or syncope.  No overt bleeding or bruising.  Currently not taking her meds as she was pain today and plans to start meds tomorrow.  Recent hgb 8.1 with normal MCV.  Pt denies new neurological symptoms such as new headache, or facial or extremity weakness or numbness.  Does recall now having a colonoscopy just over a yr ago with a GI near [place], cannot recall name, but no significant findings, told to f/u in 10 yrs.  Denies worsening reflux, abd pain, dysphagia, n/v, bowel change or blood. Past Medical History  Diagnosis Date  . Hypertension   . Obesity   . Tobacco abuse   . Hyperlipidemia 05/30/2011    NEC/NOS    . Asthma   . Allergic rhinitis, cause unspecified 05/01/2012   Past Surgical History  Procedure Date  . Arthroscopic right knee     5-6 yrs ago  . Tubal ligation     reports that she has been smoking Cigarettes.  She has been smoking about .5 packs per day. She has never used smokeless tobacco. She reports that she drinks alcohol. She reports that she does not use illicit drugs. family history includes Diabetes in her brother, mother, and sister; Heart disease in her father; and Kidney disease in her mother. Allergies  Allergen Reactions  . Lodine (Etodolac) Other (See Comments)    GI upset   Current Outpatient Prescriptions on File Prior to Visit  Medication Sig Dispense Refill  . amLODipine (NORVASC) 5 MG tablet Take 1 tablet (5 mg total) by mouth daily.  30 tablet  11  . atorvastatin (LIPITOR) 20 MG tablet TAKE ONE TABLET BY MOUTH EVERY DAY  30 tablet  5  . azithromycin (ZITHROMAX Z-PAK) 250 MG tablet Use as directed  6 each  1  . b complex vitamins tablet Take 1  tablet by mouth daily.      Marland Kitchen buPROPion (WELLBUTRIN XL) 150 MG 24 hr tablet Take 1 tablet (150 mg total) by mouth daily.  30 tablet  3  . fluticasone (FLONASE) 50 MCG/ACT nasal spray Place 1 spray into the nose 2 (two) times daily.  16 g  2  . hydrOXYzine (ATARAX/VISTARIL) 10 MG tablet 1-2 tabs by mouth every 6 hrs as needed for itching  60 tablet  1  . Multiple Vitamin (MULTIVITAMIN) tablet Take 1 tablet by mouth daily.      . phentermine 37.5 MG capsule Take 1 capsule (37.5 mg total) by mouth every morning.  30 capsule  2  . triamcinolone cream (KENALOG) 0.1 % APPLY TO AFFECTED AREA TWICE DAILY  30 g  1   Review of Systems  Constitutional: Negative for unexpected weight change, or unusual diaphoresis  HENT: Negative for tinnitus.   Eyes: Negative for photophobia and visual disturbance.  Respiratory: Negative for choking and stridor.   Gastrointestinal: Negative for vomiting and blood in stool.  Genitourinary: Negative for hematuria and decreased urine volume.  Musculoskeletal: Negative for acute joint swelling Skin: Negative for color change and wound.  Neurological: Negative for tremors and numbness other than noted  Psychiatric/Behavioral: Negative for decreased concentration or  hyperactivity.       Objective:   Physical Exam  BP 138/80  Pulse 95  Temp 97.9 F (36.6 C) (Oral)  Ht 5\' 5"  (1.651 m)  Wt 240 lb 8 oz (109.09 kg)  BMI 40.02 kg/m2  SpO2 99% VS noted,  Constitutional: Pt appears well-developed and well-nourished. Bethany Lopez HENT: Head: NCAT.  Right Ear: External ear normal.  Left Ear: External ear normal.  Eyes: Conjunctivae and EOM are normal. Pupils are equal, round, and reactive to light.  Neck: Normal range of motion. Neck supple.  Cardiovascular: Normal rate and regular rhythm.   Pulmonary/Chest: Effort normal and breath sounds normal.  Abd:  Soft, NT, non-distended, + BS DRE: normal tone, no rectal mass, brown stool but Heme Positive Neurological: Pt is alert.  Not confused  Skin: Skin is warm. No erythema.  Psychiatric: Pt behavior is normal. Thought content normal.     Assessment & Plan:

## 2012-05-06 ENCOUNTER — Encounter: Payer: Self-pay | Admitting: Internal Medicine

## 2012-05-06 ENCOUNTER — Ambulatory Visit (INDEPENDENT_AMBULATORY_CARE_PROVIDER_SITE_OTHER): Payer: BC Managed Care – PPO | Admitting: Gastroenterology

## 2012-05-06 ENCOUNTER — Other Ambulatory Visit (INDEPENDENT_AMBULATORY_CARE_PROVIDER_SITE_OTHER): Payer: BC Managed Care – PPO

## 2012-05-06 ENCOUNTER — Encounter: Payer: Self-pay | Admitting: Gastroenterology

## 2012-05-06 VITALS — BP 150/68 | HR 88 | Ht 65.0 in | Wt 239.0 lb

## 2012-05-06 DIAGNOSIS — D649 Anemia, unspecified: Secondary | ICD-10-CM

## 2012-05-06 DIAGNOSIS — R195 Other fecal abnormalities: Secondary | ICD-10-CM

## 2012-05-06 LAB — CBC WITH DIFFERENTIAL/PLATELET
Basophils Relative: 0.8 % (ref 0.0–3.0)
Eosinophils Relative: 6 % — ABNORMAL HIGH (ref 0.0–5.0)
HCT: 29.5 % — ABNORMAL LOW (ref 36.0–46.0)
Lymphs Abs: 1.2 10*3/uL (ref 0.7–4.0)
Monocytes Relative: 9.7 % (ref 3.0–12.0)
Platelets: 79 10*3/uL — ABNORMAL LOW (ref 150.0–400.0)
RBC: 3.39 Mil/uL — ABNORMAL LOW (ref 3.87–5.11)
WBC: 4.6 10*3/uL (ref 4.5–10.5)

## 2012-05-06 LAB — FOLATE: Folate: >24.8 ng/mL (ref >5.9)

## 2012-05-06 LAB — IBC PANEL: Transferrin: 267.8 mg/dL (ref 212.0–360.0)

## 2012-05-06 LAB — PROTIME-INR: INR: 1.1 ratio — ABNORMAL HIGH (ref 0.8–1.0)

## 2012-05-06 LAB — RETICULOCYTES
ABS Retic: 218.9 10*3/uL — ABNORMAL HIGH (ref 19.0–186.0)
Retic Ct Pct: 6.2 % — ABNORMAL HIGH (ref 0.4–2.3)

## 2012-05-06 LAB — FERRITIN: Ferritin: 310 ng/mL — ABNORMAL HIGH (ref 10.0–291.0)

## 2012-05-06 LAB — HM MAMMOGRAPHY: HM Mammogram: NEGATIVE

## 2012-05-06 NOTE — Patient Instructions (Addendum)
You have been given a separate informational sheet regarding your tobacco use, the importance of quitting and local resources to help you quit.  You have been scheduled for an endoscopy with propofol. Please follow written instructions given to you at your visit today. If you use inhalers (even only as needed) or a CPAP machine, please bring them with you on the day of your procedure.  Thank you for choosing me and [facility].

## 2012-05-06 NOTE — Progress Notes (Signed)
05/06/2012 Bethany Lopez [id number] Jun 13, 1950   HISTORY OF PRESENT ILLNESS:  Patient is a pleasant 62 year old female who presents to our office today at the request of Dr. Jonny Ruiz for evaluation of anemia and heme positive stools.  She was found to have a Hgb of 8.1 grams on recent labs; this is down from 13. 8 grams seven months ago.  She had brown stool on rectal exam in his office, but was heme positive.  She does not have any GI complaints except that her stools have been more on the darker side recently.  She does not take any anti-coagulation but was taking NSAID's and ASA a few times a day recently for upper respiratory symptoms.  She had a colonoscopy in 11/2010 by Dr. Mechele Collin at [facility], which was normal; had internal hemorrhoids and diverticulosis in the sigmoid and descending colon.  It was recommended that she have another colonoscopy in 10 years from that time.  She is having iron studies and other labs drawn today, which were ordered by Dr. Jonny Ruiz.  Has started on OTC iron supplement at his request as well.   Past Medical History  Diagnosis Date  . Hypertension   . Obesity   . Tobacco abuse   . Hyperlipidemia 05/30/2011    NEC/NOS    . Asthma   . Allergic rhinitis, cause unspecified 05/01/2012   Past Surgical History  Procedure Date  . Arthroscopic right knee     5-6 yrs ago  . Tubal ligation     reports that she has been smoking Cigarettes.  She has been smoking about .5 packs per day. She has never used smokeless tobacco. She reports that she drinks alcohol. She reports that she does not use illicit drugs. family history includes Diabetes in her brother, mother, and sister; Heart disease in her father; and Kidney disease in her mother. Allergies  Allergen Reactions  . Lodine (Etodolac) Other (See Comments)    GI upset      Outpatient Encounter Prescriptions as of 05/06/2012  Medication Sig Dispense Refill  . amLODipine (NORVASC) 5 MG tablet Take 1 tablet (5 mg total) by mouth  daily.  30 tablet  11  . atorvastatin (LIPITOR) 20 MG tablet TAKE ONE TABLET BY MOUTH EVERY DAY  30 tablet  5  . azithromycin (ZITHROMAX Z-PAK) 250 MG tablet Use as directed  6 each  1  . b complex vitamins tablet Take 1 tablet by mouth daily.      Marland Kitchen buPROPion (WELLBUTRIN XL) 150 MG 24 hr tablet Take 1 tablet (150 mg total) by mouth daily.  30 tablet  3  . fluticasone (FLONASE) 50 MCG/ACT nasal spray Place 1 spray into the nose 2 (two) times daily.  16 g  2  . hydrOXYzine (ATARAX/VISTARIL) 10 MG tablet 1-2 tabs by mouth every 6 hrs as needed for itching  60 tablet  1  . Multiple Vitamin (MULTIVITAMIN) tablet Take 1 tablet by mouth daily.      . phentermine 37.5 MG capsule Take 1 capsule (37.5 mg total) by mouth every morning.  30 capsule  2  . triamcinolone cream (KENALOG) 0.1 % APPLY TO AFFECTED AREA TWICE DAILY  30 g  1     REVIEW OF SYSTEMS  : All other systems reviewed and negative except where noted in the History of Present Illness.   PHYSICAL EXAM: BP 150/68  Pulse 88  Ht 5\' 5"  (1.651 m)  Wt 239 lb (108.41 kg)  BMI 39.77 kg/m2  General: Well developed black female in no acute distress Head: Normocephalic and atraumatic Eyes:  sclerae anicteric,conjunctive pink. Ears: Normal auditory acuity Lungs: Clear throughout to auscultation Heart: Regular rate and rhythm Abdomen: Soft, nontender, non distended. No masses or hepatomegaly noted. Normal bowel sounds Rectal: Deferred; was heme positive by Dr. Jonny Ruiz. Musculoskeletal: Symmetrical with no gross deformities  Extremities: Slight pitting edema in B/L LE's Neurological: Alert oriented x 4, grossly nonfocal Psychological:  Alert and cooperative. Normal mood and affect  ASSESSMENT AND PLAN: -Anemia with heme positive stools:  Hgb 8.1 grams recently, which is down from 13.8 grams 7 months ago.  She does not have any GI complaints except that her stools have been somewhat darker than normal.  Was taking NSAID's couple of times a day  for a couple of weeks for upper respiratory symptoms.  Had colonoscopy 2 years ago at [facility], which showed only internal hemorrhoids and diverticullosis.  She is getting iron studies, etc done today and we will follow-up those results.

## 2012-05-06 NOTE — Progress Notes (Signed)
Reviewed and agree with management plan.  Malcolm T. Stark, MD FACG 

## 2012-05-28 ENCOUNTER — Ambulatory Visit (AMBULATORY_SURGERY_CENTER): Payer: BC Managed Care – PPO | Admitting: Gastroenterology

## 2012-05-28 ENCOUNTER — Encounter: Payer: Self-pay | Admitting: Gastroenterology

## 2012-05-28 VITALS — BP 108/66 | HR 76 | Temp 98.3°F | Resp 22 | Ht 65.0 in | Wt 239.0 lb

## 2012-05-28 DIAGNOSIS — K2990 Gastroduodenitis, unspecified, without bleeding: Secondary | ICD-10-CM

## 2012-05-28 DIAGNOSIS — R195 Other fecal abnormalities: Secondary | ICD-10-CM

## 2012-05-28 DIAGNOSIS — D649 Anemia, unspecified: Secondary | ICD-10-CM

## 2012-05-28 DIAGNOSIS — D131 Benign neoplasm of stomach: Secondary | ICD-10-CM

## 2012-05-28 DIAGNOSIS — D130 Benign neoplasm of esophagus: Secondary | ICD-10-CM

## 2012-05-28 DIAGNOSIS — K2970 Gastritis, unspecified, without bleeding: Secondary | ICD-10-CM

## 2012-05-28 MED ORDER — OMEPRAZOLE 40 MG PO CPDR: 40.0000 mg | DELAYED_RELEASE_CAPSULE | Freq: Every day | ORAL | Status: DC

## 2012-05-28 MED ORDER — SODIUM CHLORIDE 0.9 % IV SOLN: 500.0000 mL | INTRAVENOUS | Status: DC

## 2012-05-28 NOTE — Op Note (Signed)
[facility] [address] [place], [address]   ENDOSCOPY PROCEDURE REPORT  PATIENT: Bethany, Lopez  MR#: [id number] BIRTHDATE: 1950/11/23 , 61  yrs. old GENDER: Female ENDOSCOPIST: Meryl Dare, MD, [facility] REFERRED BY:  Oliver Barre, MD PROCEDURE DATE:  05/28/2012 PROCEDURE:  EGD w/ biopsy ASA CLASS:     Class III INDICATIONS:  Anemia.   Heme positive stool. MEDICATIONS: MAC sedation, administered by CRNA and propofol (Diprivan) 350mg  IV TOPICAL ANESTHETIC: none DESCRIPTION OF PROCEDURE: After the risks benefits and alternatives of the procedure were thoroughly explained, informed consent was obtained.  The LB GIF-H180 [id number] endoscope was introduced through the mouth and advanced to the second portion of the duodenum without limitations.  The instrument was slowly withdrawn as the mucosa was fully examined.  STOMACH: Moderate gastritis (inflammation) was found in the gastric antrum, gastric body, gastric fundus, and cardia. Severel erosions noted in the antrum. Multiple biopsies were performed.   The stomach otherwise appeared normal. 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  The stenosis was mild and traversable with the endoscope.  Multiple biopsies were performed. The esophagus was otherwise normal. DUODENUM: The duodenal mucosa showed no abnormalities in the bulb and second portion of the duodenum.  Retroflexed views revealed a small hiatal hernia.     The scope was then withdrawn from the patient and the procedure completed.  COMPLICATIONS: There were no complications.  ENDOSCOPIC IMPRESSION: 1.   Gastritis, diffuse; multiple biopsies 2.   Small hiatal hernia 3.   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  Await pathology results 2.  PPI qam: omeprazole 40 mg po qam, 1 year of refills 3.  Gastritis could account for blood loss. Follow up with Dr. Jonny Ruiz. If  anemia is persist or recurrent consider SBCE 4.  Avoid ASA/NSAIDs   eSigned:  Meryl Dare, MD, [facility] 05/28/2012 3:22 PM

## 2012-05-28 NOTE — Patient Instructions (Addendum)

## 2012-05-28 NOTE — Progress Notes (Signed)
Patient did not experience any of the following events: a burn prior to discharge; a fall within the facility; wrong site/side/patient/procedure/implant event; or a hospital transfer or hospital admission upon discharge from the facility. (G8907) Patient did not have preoperative order for IV antibiotic SSI prophylaxis. (G8918)  

## 2012-05-28 NOTE — Progress Notes (Signed)
Called to room to assist during endoscopic procedure.  Patient ID and intended procedure confirmed with present staff. Received instructions for my participation in the procedure from the performing physician.  

## 2012-05-29 ENCOUNTER — Telehealth: Payer: Self-pay | Admitting: *Deleted

## 2012-05-29 NOTE — Telephone Encounter (Signed)
  Follow up Call-  Call back number 05/28/2012  Post procedure Call Back phone  # [phone]  Permission to leave phone message Yes     Patient questions:  Do you have a fever, pain , or abdominal swelling? no Pain Score  0 *  Have you tolerated food without any problems? yes  Have you been able to return to your normal activities? yes  Do you have any questions about your discharge instructions: Diet   no Medications  no Follow up visit  no  Do you have questions or concerns about your Care? no  Actions: * If pain score is 4 or above: No action needed, pain <4.

## 2012-06-04 ENCOUNTER — Encounter: Payer: Self-pay | Admitting: Gastroenterology

## 2012-06-19 ENCOUNTER — Telehealth: Payer: Self-pay

## 2012-06-19 NOTE — Telephone Encounter (Signed)
I dont see any documentation of needing to stop a med prior to a procedure, so I am not sure about which med that was.  I have reviewed the med list.  Should be ok to take all meds currently on the list.  Robin to go over her meds, and provide rx if needed.  The rx per Dr Russella Dar was probably the omeprazole 40 mg.

## 2012-06-19 NOTE — Telephone Encounter (Signed)
Called left message to call back 

## 2012-06-19 NOTE — Telephone Encounter (Signed)
The patient had endoscopy in feb. And was prescribed by Dr. Russella Dar a medication she does not know what it is for.  Also Dr. Jonny Ruiz informed her to stop a medication before procedure and would like to know if she should start back on .   Please advise on medications as the patient has not been instructed since procedure what medications to start.   Please advise

## 2012-06-20 NOTE — Telephone Encounter (Signed)
Called left message to call back 

## 2012-06-20 NOTE — Telephone Encounter (Signed)
Informed the patient of MD instructions.  The patient would like to know if ok to take weight loss medication prescribed by PCP?

## 2012-06-20 NOTE — Telephone Encounter (Signed)
Ok to take the wt loss pill now. thanks

## 2012-06-24 NOTE — Telephone Encounter (Signed)
Called informed the patients husband of ok to take medication.

## 2012-08-07 ENCOUNTER — Other Ambulatory Visit (INDEPENDENT_AMBULATORY_CARE_PROVIDER_SITE_OTHER): Payer: BC Managed Care – PPO

## 2012-08-07 ENCOUNTER — Encounter: Payer: Self-pay | Admitting: Internal Medicine

## 2012-08-07 ENCOUNTER — Ambulatory Visit (INDEPENDENT_AMBULATORY_CARE_PROVIDER_SITE_OTHER): Payer: BC Managed Care – PPO | Admitting: Internal Medicine

## 2012-08-07 VITALS — BP 120/80 | HR 79 | Temp 97.2°F | Ht 65.0 in | Wt 238.2 lb

## 2012-08-07 DIAGNOSIS — J309 Allergic rhinitis, unspecified: Secondary | ICD-10-CM

## 2012-08-07 DIAGNOSIS — D649 Anemia, unspecified: Secondary | ICD-10-CM

## 2012-08-07 DIAGNOSIS — Z0000 Encounter for general adult medical examination without abnormal findings: Secondary | ICD-10-CM

## 2012-08-07 DIAGNOSIS — D696 Thrombocytopenia, unspecified: Secondary | ICD-10-CM

## 2012-08-07 DIAGNOSIS — I10 Essential (primary) hypertension: Secondary | ICD-10-CM

## 2012-08-07 LAB — CBC WITH DIFFERENTIAL/PLATELET
Basophils Relative: 0.6 % (ref 0.0–3.0)
Eosinophils Absolute: 0.3 10*3/uL (ref 0.0–0.7)
Lymphocytes Relative: 37.1 % (ref 12.0–46.0)
MCHC: 33.2 g/dL (ref 30.0–36.0)
Monocytes Relative: 11.9 % (ref 3.0–12.0)
Neutrophils Relative %: 45.2 % (ref 43.0–77.0)
RBC: 5.2 Mil/uL — ABNORMAL HIGH (ref 3.87–5.11)
WBC: 4.8 10*3/uL (ref 4.5–10.5)

## 2012-08-07 LAB — SEDIMENTATION RATE: Sed Rate: 23 mm/hr — ABNORMAL HIGH (ref 0–22)

## 2012-08-07 LAB — IBC PANEL
Saturation Ratios: 18.1 % — ABNORMAL LOW (ref 20.0–50.0)
Transferrin: 236.4 mg/dL (ref 212.0–360.0)

## 2012-08-07 MED ORDER — METHYLPREDNISOLONE ACETATE 80 MG/ML IJ SUSP
80.0000 mg | Freq: Once | INTRAMUSCULAR | Status: AC
  Administered 2012-08-07: 80 mg via INTRAMUSCULAR

## 2012-08-07 MED ORDER — FEXOFENADINE HCL 180 MG PO TABS: 180.0000 mg | ORAL_TABLET | Freq: Every day | ORAL | Status: DC

## 2012-08-07 NOTE — Assessment & Plan Note (Signed)
For f/u today, if persistent or worse will need heme referral

## 2012-08-07 NOTE — Progress Notes (Signed)
Subjective:    Patient ID: Bethany Lopez, female    DOB: November 29, 1950, 62 y.o.   MRN: [id number]  HPI  Here to f/u; has 1-2 wks ongoing worsening flare of nasal allergy symptoms with clearish congestion, itch and sneezing, without fever, pain, ST, swelling or wheezing,  But has mild non prod cough that keeps her up at night with the post nasal gtt.   Pt denies fever, wt loss, night sweats, loss of appetite, or other constitutional symptoms Did have EGD/colon done without specific source for GI blood loss, was found to have gastritis, and esoph stricture, last hgn jan 2014 over 9. No further overt bleeding or bruising, has low plt as well for unclear reason.  Rec'd for possible capsule endoscopy eval if anemia persists.  Overall good compliance with treatment, and good medicine tolerability, including the iron, colace, and PPI tx.   Has flonase at home but not using the past wk Past Medical History  Diagnosis Date  . Hypertension   . Obesity   . Tobacco abuse   . Hyperlipidemia 05/30/2011    NEC/NOS    . Asthma   . Allergic rhinitis, cause unspecified 05/01/2012   Past Surgical History  Procedure Laterality Date  . Arthroscopic right knee      5-6 yrs ago  . Tubal ligation      reports that she has been smoking Cigarettes.  She has been smoking about 0.50 packs per day. She has never used smokeless tobacco. She reports that  drinks alcohol. She reports that she does not use illicit drugs. family history includes Diabetes in her brother, mother, and sister; Heart disease in her father; and Kidney disease in her mother. Allergies  Allergen Reactions  . Lodine (Etodolac) Other (See Comments)    GI upset   Current Outpatient Prescriptions on File Prior to Visit  Medication Sig Dispense Refill  . amLODipine (NORVASC) 5 MG tablet Take 1 tablet (5 mg total) by mouth daily.  30 tablet  11  . atorvastatin (LIPITOR) 20 MG tablet TAKE ONE TABLET BY MOUTH EVERY DAY  30 tablet  5  . b complex  vitamins tablet Take 1 tablet by mouth daily.      . fluticasone (FLONASE) 50 MCG/ACT nasal spray Place 1 spray into the nose 2 (two) times daily.  16 g  2  . hydrOXYzine (ATARAX/VISTARIL) 10 MG tablet 1-2 tabs by mouth every 6 hrs as needed for itching  60 tablet  1  . Multiple Vitamin (MULTIVITAMIN) tablet Take 1 tablet by mouth daily.      Marland Kitchen omeprazole (PRILOSEC) 40 MG capsule Take 1 capsule (40 mg total) by mouth daily.  90 capsule  12  . phentermine 37.5 MG capsule Take 1 capsule (37.5 mg total) by mouth every morning.  30 capsule  2  . triamcinolone cream (KENALOG) 0.1 % APPLY TO AFFECTED AREA TWICE DAILY  30 g  1  . buPROPion (WELLBUTRIN XL) 150 MG 24 hr tablet Take 1 tablet (150 mg total) by mouth daily.  30 tablet  3   No current facility-administered medications on file prior to visit.   Review of Systems  Constitutional: Negative for unexpected weight change, or unusual diaphoresis  HENT: Negative for tinnitus.   Eyes: Negative for photophobia and visual disturbance.  Respiratory: Negative for choking and stridor.   Gastrointestinal: Negative for vomiting and blood in stool.  Genitourinary: Negative for hematuria and decreased urine volume.  Musculoskeletal: Negative for acute joint swelling Skin: Negative  for color change and wound.  Neurological: Negative for tremors and numbness other than noted  Psychiatric/Behavioral: Negative for decreased concentration or  hyperactivity.       Objective:   Physical Exam BP 120/80  Pulse 79  Temp(Src) 97.2 F (36.2 C) (Oral)  Ht 5\' 5"  (1.651 m)  Wt 238 lb 4 oz (108.069 kg)  BMI 39.65 kg/m2  SpO2 99% VS noted, not ill appearing but uncomfortable Constitutional: Pt appears well-developed and well-nourished.  HENT: Head: NCAT.  Right Ear: External ear normal.  Left Ear: External ear normal.  Bilat tm's with mild erythema.  Max sinus areas minor tender.  Pharynx with mild erythema, no exudate Eyes: Conjunctivae and EOM are normal.  Pupils are equal, round, and reactive to light.  Neck: Normal range of motion. Neck supple.  Cardiovascular: Normal rate and regular rhythm.   Pulmonary/Chest: Effort normal and breath sounds normal.  Abd:  Soft, NT, non-distended, + BS, no HSM Neurological: Pt is alert. Not confused , motor intact Skin: Skin is warm. No erythema. No bruising Psychiatric: Pt behavior is normal. Thought content normal. Not depressed affect or overly nervous    Assessment & Plan:

## 2012-08-07 NOTE — Assessment & Plan Note (Signed)
For f/u labs today, if persistent may need further GI eval, and/or heme eval

## 2012-08-07 NOTE — Assessment & Plan Note (Signed)
stable overall by history and exam, recent data reviewed with pt, and pt to continue medical treatment as before,  to f/u any worsening symptoms or concerns . BP Readings from Last 3 Encounters:  08/07/12 120/80  05/28/12 108/66  05/06/12 150/68

## 2012-08-07 NOTE — Patient Instructions (Signed)
You had the steroid shot today Please take all new medication as prescribed - the allegra Please continue all other medications as before, including the flonase, iron, colace and omprazole for now Please go to the LAB in the Basement (turn left off the elevator) for the tests to be done today You will be contacted by phone if any changes need to be made immediately.  Otherwise, you will receive a letter about your results with an explanation Please remember to sign up for My Chart if you have not done so, as this will be important to you in the future with finding out test results, communicating by private email, and scheduling acute appointments online when needed.

## 2012-08-07 NOTE — Assessment & Plan Note (Signed)
Mild to mod, for depomedrol IM, allegra and flonase asd,  to f/u any worsening symptoms or concerns

## 2012-08-28 ENCOUNTER — Other Ambulatory Visit: Payer: Self-pay | Admitting: Internal Medicine

## 2012-09-19 ENCOUNTER — Telehealth: Payer: Self-pay | Admitting: *Deleted

## 2012-09-19 NOTE — Telephone Encounter (Signed)
Left msg on traige requesting number to billing. Called pt back no answer LMOM for billing...lmb

## 2012-11-11 ENCOUNTER — Inpatient Hospital Stay: Payer: Self-pay | Admitting: Specialist

## 2012-11-11 LAB — COMPREHENSIVE METABOLIC PANEL
Anion Gap: 3 — ABNORMAL LOW (ref 7–16)
BUN: 20 mg/dL — ABNORMAL HIGH (ref 7–18)
Calcium, Total: 8.5 mg/dL (ref 8.5–10.1)
Chloride: 108 mmol/L — ABNORMAL HIGH (ref 98–107)
Creatinine: 1.06 mg/dL (ref 0.60–1.30)
EGFR (Non-African Amer.): 56 — ABNORMAL LOW
Potassium: 4 mmol/L (ref 3.5–5.1)
SGOT(AST): 30 U/L (ref 15–37)
SGPT (ALT): 36 U/L (ref 12–78)

## 2012-11-11 LAB — CBC
HGB: 7.1 g/dL — ABNORMAL LOW (ref 12.0–16.0)
MCH: 30 pg (ref 26.0–34.0)
MCHC: 32.1 g/dL (ref 32.0–36.0)
MCV: 94 fL (ref 80–100)
RBC: 2.36 10*6/uL — ABNORMAL LOW (ref 3.80–5.20)
WBC: 6.6 10*3/uL (ref 3.6–11.0)

## 2012-11-12 LAB — BASIC METABOLIC PANEL
Anion Gap: 2 — ABNORMAL LOW (ref 7–16)
Calcium, Total: 8.4 mg/dL — ABNORMAL LOW (ref 8.5–10.1)
Co2: 28 mmol/L (ref 21–32)
EGFR (African American): >60
EGFR (Non-African Amer.): >60
Glucose: 91 mg/dL (ref 65–99)
Osmolality: 279 (ref 275–301)
Potassium: 4.1 mmol/L (ref 3.5–5.1)
Sodium: 140 mmol/L (ref 136–145)

## 2012-11-12 LAB — CBC WITH DIFFERENTIAL/PLATELET
Basophil #: 0 10*3/uL (ref 0.0–0.1)
Basophil %: 0.9 %
Eosinophil #: 0.2 10*3/uL (ref 0.0–0.7)
Eosinophil %: 4.4 %
HCT: 24.1 % — ABNORMAL LOW (ref 35.0–47.0)
HGB: 7.5 g/dL — ABNORMAL LOW (ref 12.0–16.0)
Lymphocyte #: 1.3 10*3/uL (ref 1.0–3.6)
Lymphocyte %: 25.5 %
MCHC: 31.2 g/dL — ABNORMAL LOW (ref 32.0–36.0)
Monocyte %: 9.5 %
Neutrophil #: 3.1 10*3/uL (ref 1.4–6.5)
Neutrophil %: 59.7 %
RBC: 2.64 10*6/uL — ABNORMAL LOW (ref 3.80–5.20)
WBC: 5.1 10*3/uL (ref 3.6–11.0)

## 2012-11-12 LAB — IRON AND TIBC
Iron Saturation: 13 %
Iron: 41 ug/dL — ABNORMAL LOW (ref 50–170)
Unbound Iron-Bind.Cap.: 270 ug/dL

## 2012-11-12 LAB — PROTIME-INR
INR: 1.1
Prothrombin Time: 14 secs (ref 11.5–14.7)

## 2012-11-12 LAB — HEMOGLOBIN: HGB: 8.1 g/dL — ABNORMAL LOW (ref 12.0–16.0)

## 2012-11-12 LAB — FERRITIN: Ferritin (ARMC): 55 ng/mL (ref 8–388)

## 2012-11-13 LAB — CBC WITH DIFFERENTIAL/PLATELET
Basophil #: 0.1 10*3/uL (ref 0.0–0.1)
Eosinophil #: 0.3 10*3/uL (ref 0.0–0.7)
Eosinophil %: 4.5 %
HCT: 26.2 % — ABNORMAL LOW (ref 35.0–47.0)
Lymphocyte #: 1.1 10*3/uL (ref 1.0–3.6)
Lymphocyte %: 18.2 %
MCH: 30.2 pg (ref 26.0–34.0)
MCHC: 32.6 g/dL (ref 32.0–36.0)
MCV: 93 fL (ref 80–100)
Monocyte #: 0.4 x10 3/mm (ref 0.2–0.9)
Monocyte %: 7.3 %
Neutrophil #: 4 10*3/uL (ref 1.4–6.5)
Platelet: 61 10*3/uL — ABNORMAL LOW (ref 150–440)
RBC: 2.83 10*6/uL — ABNORMAL LOW (ref 3.80–5.20)
RDW: 23.6 % — ABNORMAL HIGH (ref 11.5–14.5)
WBC: 5.9 10*3/uL (ref 3.6–11.0)

## 2012-11-14 ENCOUNTER — Telehealth: Payer: Self-pay | Admitting: Internal Medicine

## 2012-11-14 LAB — PATHOLOGY REPORT

## 2012-11-14 NOTE — Telephone Encounter (Signed)
Pt just got out of the hospital.  She is having tests this week where she will swallow a pill to see why she is anemic.  She has an appt on Aug 28.  She wants to know if it is ok to keep that appt instead of coming in sooner.

## 2012-11-14 NOTE — Telephone Encounter (Signed)
Anytime this wk or next would be fine, and we will plan on repeating her labs, as it looks like she has not been tested since mid april

## 2012-11-15 NOTE — Telephone Encounter (Signed)
Called the patient left a detailed msg. Of MD instructions

## 2012-11-20 ENCOUNTER — Ambulatory Visit: Payer: Self-pay | Admitting: Gastroenterology

## 2012-12-12 ENCOUNTER — Ambulatory Visit (INDEPENDENT_AMBULATORY_CARE_PROVIDER_SITE_OTHER): Payer: BC Managed Care – PPO | Admitting: Internal Medicine

## 2012-12-12 ENCOUNTER — Other Ambulatory Visit (INDEPENDENT_AMBULATORY_CARE_PROVIDER_SITE_OTHER): Payer: BC Managed Care – PPO

## 2012-12-12 ENCOUNTER — Encounter: Payer: Self-pay | Admitting: Internal Medicine

## 2012-12-12 VITALS — BP 132/80 | HR 74 | Temp 98.2°F | Ht 65.0 in | Wt 241.2 lb

## 2012-12-12 DIAGNOSIS — Z0000 Encounter for general adult medical examination without abnormal findings: Secondary | ICD-10-CM

## 2012-12-12 DIAGNOSIS — J309 Allergic rhinitis, unspecified: Secondary | ICD-10-CM

## 2012-12-12 DIAGNOSIS — D649 Anemia, unspecified: Secondary | ICD-10-CM

## 2012-12-12 LAB — URINALYSIS, ROUTINE W REFLEX MICROSCOPIC
Bilirubin Urine: NEGATIVE
Ketones, ur: NEGATIVE
RBC / HPF: NONE SEEN (ref 0–?)
Specific Gravity, Urine: 1.02 (ref 1.000–1.030)
Total Protein, Urine: NEGATIVE
Urine Glucose: NEGATIVE
pH: 6 (ref 5.0–8.0)

## 2012-12-12 LAB — CBC WITH DIFFERENTIAL/PLATELET
Basophils Relative: 0.7 % (ref 0.0–3.0)
Eosinophils Relative: 4.9 % (ref 0.0–5.0)
HCT: 34.4 % — ABNORMAL LOW (ref 36.0–46.0)
Hemoglobin: 11 g/dL — ABNORMAL LOW (ref 12.0–15.0)
Lymphocytes Relative: 28.5 % (ref 12.0–46.0)
Lymphs Abs: 1.4 10*3/uL (ref 0.7–4.0)
Monocytes Relative: 10.4 % (ref 3.0–12.0)
Neutro Abs: 2.8 10*3/uL (ref 1.4–7.7)
RBC: 3.95 Mil/uL (ref 3.87–5.11)
WBC: 5.1 10*3/uL (ref 4.5–10.5)

## 2012-12-12 MED ORDER — METHYLPREDNISOLONE ACETATE 80 MG/ML IJ SUSP
80.0000 mg | Freq: Once | INTRAMUSCULAR | Status: AC
  Administered 2012-12-12: 80 mg via INTRAMUSCULAR

## 2012-12-12 MED ORDER — FLUTICASONE PROPIONATE 50 MCG/ACT NA SUSP: 1.0000 | Freq: Two times a day (BID) | NASAL | Status: DC

## 2012-12-12 MED ORDER — PANTOPRAZOLE SODIUM 40 MG PO TBEC: 40.0000 mg | DELAYED_RELEASE_TABLET | Freq: Two times a day (BID) | ORAL | Status: DC

## 2012-12-12 NOTE — Patient Instructions (Signed)
You had the steroid shot today Please continue all other medications as before, and refills have been done if requested. Please have the pharmacy call with any other refills you may need. Please continue your efforts at being more active, low cholesterol diet, and weight control. You are otherwise up to date with prevention measures today. Please remember to followup with your GYN for the yearly pap smear and/or mammogram as you do Please go to the LAB in the Basement (turn left off the elevator) for the tests to be done today  Please also plan for followup with Just the CBC (blood count) in 3 months  You will be contacted by phone if any changes need to be made immediately.  Otherwise, you will receive a letter about your results with an explanation, but please check with MyChart first.  Please remember to sign up for My Chart if you have not done so, as this will be important to you in the future with finding out test results, communicating by private email, and scheduling acute appointments online when needed.  Please return in 6 months, or sooner if needed

## 2012-12-12 NOTE — Assessment & Plan Note (Signed)
Also for f/u cbc in 3 mo

## 2012-12-12 NOTE — Progress Notes (Signed)
Subjective:    Patient ID: Bethany Lopez, female    DOB: Sep 21, 1950, 62 y.o.   MRN: [id number]  HPI  Here for wellness and f/u;  Overall doing ok;  Pt denies CP, worsening SOB, DOE, wheezing, orthopnea, PND, worsening LE edema, palpitations, dizziness or syncope.  Pt denies neurological change such as new headache, facial or extremity weakness.  Pt denies polydipsia, polyuria, or low sugar symptoms. Pt states overall good compliance with treatment and medications, good tolerability, and has been trying to follow lower cholesterol diet.  Pt denies worsening depressive symptoms, suicidal ideation or panic. No fever, night sweats, wt loss, loss of appetite, or other constitutional symptoms.  Pt states good ability with ADL's, has low fall risk, home safety reviewed and adequate, no other significant changes in hearing or vision, and only occasionally active with exercise.  Did have signficant iron def anemia with nadir hgn 8.1, resolved with iron by April.  Unfortuantley last mo had worsening weakness, fatigue, admitted to [facility] for 2 days with sympt anemia, required 1 PRBC.  Seen per GI with EGD, as well as abd [place] neg per pt.  Also with capsule endoscopy as outpt - no malignancy, but has "abnormal blood vessels with bleeding."  Does have several wks ongoing nasal allergy symptoms with clearish congestion, itch and sneezing, without fever, pain, ST, cough, swelling or wheezing. Past Medical History  Diagnosis Date  . Hypertension   . Obesity   . Tobacco abuse   . Hyperlipidemia 05/30/2011    NEC/NOS    . Asthma   . Allergic rhinitis, cause unspecified 05/01/2012   Past Surgical History  Procedure Laterality Date  . Arthroscopic right knee      5-6 yrs ago  . Tubal ligation      reports that she has been smoking Cigarettes.  She has been smoking about 0.50 packs per day. She has never used smokeless tobacco. She reports that  drinks alcohol. She reports that she does not use illicit  drugs. family history includes Diabetes in her brother, mother, and sister; Heart disease in her father; Kidney disease in her mother. Allergies  Allergen Reactions  . Lodine [Etodolac] Other (See Comments)    GI upset   Current Outpatient Prescriptions on File Prior to Visit  Medication Sig Dispense Refill  . amLODipine (NORVASC) 5 MG tablet Take 1 tablet (5 mg total) by mouth daily.  30 tablet  11  . atorvastatin (LIPITOR) 20 MG tablet TAKE ONE TABLET BY MOUTH EVERY DAY  30 tablet  5  . b complex vitamins tablet Take 1 tablet by mouth daily.      Marland Kitchen docusate sodium (COLACE) 100 MG capsule Take 100 mg by mouth 2 (two) times daily as needed for constipation.      . ferrous sulfate 325 (65 FE) MG tablet Take 325 mg by mouth daily.      . fexofenadine (ALLEGRA) 180 MG tablet Take 1 tablet (180 mg total) by mouth daily. As needed  90 tablet  3  . fluticasone (FLONASE) 50 MCG/ACT nasal spray Place 1 spray into the nose 2 (two) times daily.  16 g  2  . hydrOXYzine (ATARAX/VISTARIL) 10 MG tablet 1-2 tabs by mouth every 6 hrs as needed for itching  60 tablet  1  . Multiple Vitamin (MULTIVITAMIN) tablet Take 1 tablet by mouth daily.      . phentermine 37.5 MG capsule Take 1 capsule (37.5 mg total) by mouth every morning.  30 capsule  2  . triamcinolone cream (KENALOG) 0.1 % APPLY TO AFFECTED AREA TWICE DAILY  30 g  1  . buPROPion (WELLBUTRIN XL) 150 MG 24 hr tablet Take 1 tablet (150 mg total) by mouth daily.  30 tablet  3   No current facility-administered medications on file prior to visit.   Review of Systems Constitutional: Negative for diaphoresis, activity change, appetite change or unexpected weight change.  HENT: Negative for hearing loss, ear pain, facial swelling, mouth sores and neck stiffness.   Eyes: Negative for pain, redness and visual disturbance.  Respiratory: Negative for shortness of breath and wheezing.   Cardiovascular: Negative for chest pain and palpitations.   Gastrointestinal: Negative for diarrhea, blood in stool, abdominal distention or other pain Genitourinary: Negative for hematuria, flank pain or change in urine volume.  Musculoskeletal: Negative for myalgias and joint swelling.  Skin: Negative for color change and wound.  Neurological: Negative for syncope and numbness. other than noted Hematological: Negative for adenopathy.  Psychiatric/Behavioral: Negative for hallucinations, self-injury, decreased concentration and agitation.      Objective:   Physical Exam BP 132/80  Pulse 74  Temp(Src) 98.2 F (36.8 C) (Oral)  Ht 5\' 5"  (1.651 m)  Wt 241 lb 4 oz (109.43 kg)  BMI 40.15 kg/m2  SpO2 99% VS noted,  Constitutional: Pt is oriented to person, place, and time. Appears well-developed and well-nourished.  Head: Normocephalic and atraumatic.  Right Ear: External ear normal.  Left Ear: External ear normal.  Nose: Nose normal.  Bilat tm's with mild erythema.  Max sinus areas non tender.  Pharynx with mild erythema, no exudate Mouth/Throat: Oropharynx is clear and moist.  Eyes: Conjunctivae and EOM are normal. Pupils are equal, round, and reactive to light.  Neck: Normal range of motion. Neck supple. No JVD present. No tracheal deviation present.  Cardiovascular: Normal rate, regular rhythm, normal heart sounds and intact distal pulses.   Pulmonary/Chest: Effort normal and breath sounds normal.  Abdominal: Soft. Bowel sounds are normal. There is no tenderness. No HSM  Musculoskeletal: Normal range of motion. Exhibits no edema.  Lymphadenopathy:  Has no cervical adenopathy.  Neurological: Pt is alert and oriented to person, place, and time. Pt has normal reflexes. No cranial nerve deficit.  Skin: Skin is warm and dry. No rash noted.  Psychiatric:  Has  normal mood and affect. Behavior is normal.      Assessment & Plan:

## 2012-12-12 NOTE — Assessment & Plan Note (Signed)
Mild to mod flare, for depomedrol IM , re-start flonase asd,  to f/u any worsening symptoms or concerns

## 2012-12-12 NOTE — Assessment & Plan Note (Signed)

## 2012-12-13 ENCOUNTER — Encounter: Payer: Self-pay | Admitting: Internal Medicine

## 2012-12-13 LAB — BASIC METABOLIC PANEL
BUN: 16 mg/dL (ref 6–23)
CO2: 27 mEq/L (ref 19–32)
Calcium: 8.9 mg/dL (ref 8.4–10.5)
Creatinine, Ser: 0.8 mg/dL (ref 0.4–1.2)
GFR: 87.8 mL/min (ref >60.00)
Glucose, Bld: 86 mg/dL (ref 70–99)
Sodium: 139 mEq/L (ref 135–145)

## 2012-12-13 LAB — HEPATIC FUNCTION PANEL
Albumin: 3.6 g/dL (ref 3.5–5.2)
Alkaline Phosphatase: 47 U/L (ref 39–117)
Total Protein: 6.6 g/dL (ref 6.0–8.3)

## 2012-12-13 LAB — LIPID PANEL
HDL: 72 mg/dL (ref >39.00)
Triglycerides: 58 mg/dL (ref 0.0–149.0)
VLDL: 11.6 mg/dL (ref 0.0–40.0)

## 2013-02-26 ENCOUNTER — Other Ambulatory Visit: Payer: Self-pay | Admitting: Internal Medicine

## 2013-03-21 ENCOUNTER — Other Ambulatory Visit: Payer: Self-pay | Admitting: Internal Medicine

## 2013-06-12 ENCOUNTER — Ambulatory Visit: Payer: BC Managed Care – PPO | Admitting: Internal Medicine

## 2013-06-18 ENCOUNTER — Other Ambulatory Visit (INDEPENDENT_AMBULATORY_CARE_PROVIDER_SITE_OTHER): Payer: BC Managed Care – PPO

## 2013-06-18 ENCOUNTER — Encounter: Payer: Self-pay | Admitting: Internal Medicine

## 2013-06-18 ENCOUNTER — Ambulatory Visit (INDEPENDENT_AMBULATORY_CARE_PROVIDER_SITE_OTHER): Payer: BC Managed Care – PPO | Admitting: Internal Medicine

## 2013-06-18 VITALS — BP 132/84 | HR 82 | Temp 98.1°F | Ht 66.0 in | Wt 248.4 lb

## 2013-06-18 DIAGNOSIS — D649 Anemia, unspecified: Secondary | ICD-10-CM

## 2013-06-18 DIAGNOSIS — E785 Hyperlipidemia, unspecified: Secondary | ICD-10-CM

## 2013-06-18 DIAGNOSIS — J069 Acute upper respiratory infection, unspecified: Secondary | ICD-10-CM

## 2013-06-18 DIAGNOSIS — I10 Essential (primary) hypertension: Secondary | ICD-10-CM

## 2013-06-18 LAB — LIPID PANEL
CHOL/HDL RATIO: 2
Cholesterol: 139 mg/dL (ref 0–200)
HDL: 56.8 mg/dL (ref >39.00)
LDL CALC: 65 mg/dL (ref 0–99)
Triglycerides: 85 mg/dL (ref 0.0–149.0)
VLDL: 17 mg/dL (ref 0.0–40.0)

## 2013-06-18 LAB — CBC WITH DIFFERENTIAL/PLATELET
BASOS PCT: 0.5 % (ref 0.0–3.0)
Basophils Absolute: 0 10*3/uL (ref 0.0–0.1)
EOS ABS: 0.3 10*3/uL (ref 0.0–0.7)
Eosinophils Relative: 5.4 % — ABNORMAL HIGH (ref 0.0–5.0)
HCT: 41.7 % (ref 36.0–46.0)
Hemoglobin: 13.6 g/dL (ref 12.0–15.0)
Lymphocytes Relative: 31.3 % (ref 12.0–46.0)
Lymphs Abs: 1.6 10*3/uL (ref 0.7–4.0)
MCHC: 32.7 g/dL (ref 30.0–36.0)
MCV: 83.7 fl (ref 78.0–100.0)
MONOS PCT: 13 % — AB (ref 3.0–12.0)
Monocytes Absolute: 0.7 10*3/uL (ref 0.1–1.0)
NEUTROS PCT: 49.8 % (ref 43.0–77.0)
Neutro Abs: 2.5 10*3/uL (ref 1.4–7.7)
Platelets: 125 10*3/uL — ABNORMAL LOW (ref 150.0–400.0)
RBC: 4.98 Mil/uL (ref 3.87–5.11)
RDW: 14.9 % — ABNORMAL HIGH (ref 11.5–14.6)
WBC: 5.1 10*3/uL (ref 4.5–10.5)

## 2013-06-18 LAB — HEPATIC FUNCTION PANEL
ALBUMIN: 3.5 g/dL (ref 3.5–5.2)
ALK PHOS: 49 U/L (ref 39–117)
ALT: 31 U/L (ref 0–35)
AST: 29 U/L (ref 0–37)
BILIRUBIN DIRECT: 0.1 mg/dL (ref 0.0–0.3)
BILIRUBIN TOTAL: 0.9 mg/dL (ref 0.3–1.2)
Total Protein: 6.8 g/dL (ref 6.0–8.3)

## 2013-06-18 MED ORDER — ATORVASTATIN CALCIUM 20 MG PO TABS: 20.0000 mg | ORAL_TABLET | Freq: Every day | ORAL | Status: DC

## 2013-06-18 MED ORDER — AMLODIPINE BESYLATE 5 MG PO TABS: 5.0000 mg | ORAL_TABLET | Freq: Every day | ORAL | Status: DC

## 2013-06-18 MED ORDER — OMEPRAZOLE 20 MG PO CPDR: 20.0000 mg | DELAYED_RELEASE_CAPSULE | Freq: Every day | ORAL | Status: DC

## 2013-06-18 NOTE — Patient Instructions (Signed)
OK to cut back on the generic protonix to once per day until gone Then start omeparazole 20 mg per day (just call for a new prescription) Please continue all other medications as before, and refills have been done if requested. Please have the pharmacy call with any other refills you may need.  Please go to the LAB in the Basement (turn left off the elevator) for the tests to be done today You will be contacted by phone if any changes need to be made immediately.  Otherwise, you will receive a letter about your results with an explanation, but please check with MyChart first.  Please remember to sign up for MyChart if you have not done so, as this will be important to you in the future with finding out test results, communicating by private email, and scheduling acute appointments online when needed.  Please return in 6 months, or sooner if needed

## 2013-06-18 NOTE — Progress Notes (Signed)
Subjective:    Patient ID: Bethany Lopez, female    DOB: 11-Oct-1950, 63 y.o.   MRN: [id number]  HPI Here to f/u; overall doing ok,  Pt denies chest pain, increased sob or doe, wheezing, orthopnea, PND, increased LE swelling, palpitations, dizziness or syncope.  Pt denies polydipsia, polyuria, or low sugar symptoms such as weakness or confusion improved with po intake.  Pt denies new neurological symptoms such as new headache, or facial or extremity weakness or numbness.   Pt states overall good compliance with meds, has been trying to follow lower cholesterol diet, with wt overall stable, plans to be more active soon.  Insurance will no longer for protonix.  Denies worsening reflux, abd pain, dysphagia, n/v, bowel change or blood.  Incidentally with recnet mild nasal congestion and ST, now improved Past Medical History  Diagnosis Date  . Hypertension   . Obesity   . Tobacco abuse   . Hyperlipidemia 05/30/2011    NEC/NOS    . Asthma   . Allergic rhinitis, cause unspecified 05/01/2012   Past Surgical History  Procedure Laterality Date  . Arthroscopic right knee      5-6 yrs ago  . Tubal ligation      reports that she has been smoking Cigarettes.  She has been smoking about 0.50 packs per day. She has never used smokeless tobacco. She reports that she drinks alcohol. She reports that she does not use illicit drugs. family history includes Diabetes in her brother, mother, and sister; Heart disease in her father; Kidney disease in her mother. Allergies  Allergen Reactions  . Lodine [Etodolac] Other (See Comments)    GI upset   Current Outpatient Prescriptions on File Prior to Visit  Medication Sig Dispense Refill  . b complex vitamins tablet Take 1 tablet by mouth daily.      . hydrOXYzine (ATARAX/VISTARIL) 10 MG tablet 1-2 tabs by mouth every 6 hrs as needed for itching  60 tablet  1  . Multiple Vitamin (MULTIVITAMIN) tablet Take 1 tablet by mouth daily.      . pantoprazole (PROTONIX)  40 MG tablet Take 1 tablet (40 mg total) by mouth 2 (two) times daily.  180 tablet  3  . triamcinolone cream (KENALOG) 0.1 % APPLY TO AFFECTED AREA TWICE DAILY  30 g  1  . buPROPion (WELLBUTRIN XL) 150 MG 24 hr tablet Take 1 tablet (150 mg total) by mouth daily.  30 tablet  3   No current facility-administered medications on file prior to visit.   Review of Systems  Constitutional: Negative for unexpected weight change, or unusual diaphoresis  HENT: Negative for tinnitus.   Eyes: Negative for photophobia and visual disturbance.  Respiratory: Negative for choking and stridor.   Gastrointestinal: Negative for vomiting and blood in stool.  Genitourinary: Negative for hematuria and decreased urine volume.  Musculoskeletal: Negative for acute joint swelling Skin: Negative for color change and wound.  Neurological: Negative for tremors and numbness other than noted  Psychiatric/Behavioral: Negative for decreased concentration or  hyperactivity.       Objective:   Physical Exam BP 132/84  Pulse 82  Temp(Src) 98.1 F (36.7 C) (Oral)  Ht 5\' 6"  (1.676 m)  Wt 248 lb 6 oz (112.662 kg)  BMI 40.11 kg/m2  SpO2 99% VS noted, not ill appearing Constitutional: Pt appears well-developed and well-nourished.  HENT: Head: NCAT.  Right Ear: External ear normal.  Left Ear: External ear normal.  Bilat tm's with mild erythema.  Max  sinus areas non tender.  Pharynx with mild erythema, no exudate Eyes: Conjunctivae and EOM are normal. Pupils are equal, round, and reactive to light.  Neck: Normal range of motion. Neck supple.  Cardiovascular: Normal rate and regular rhythm.   Pulmonary/Chest: Effort normal and breath sounds normal.  Neurological: Pt is alert. Not confused  Skin: Skin is warm. No erythema.  Psychiatric: Pt behavior is normal. Thought content normal.     Assessment & Plan:

## 2013-06-18 NOTE — Assessment & Plan Note (Signed)
C/w viral, tylenol prn,  to f/u any worsening symptoms or concerns

## 2013-06-18 NOTE — Progress Notes (Signed)
Pre visit review using our clinic review tool, if applicable. No additional management support is needed unless otherwise documented below in the visit note. 

## 2013-06-18 NOTE — Assessment & Plan Note (Signed)
stable overall by history and exam, recent data reviewed with pt, and pt to continue medical treatment as before,  to f/u any worsening symptoms or concerns Lab Results  Component Value Date   LDLCALC 65 06/18/2013

## 2013-06-18 NOTE — Assessment & Plan Note (Signed)
stable overall by history and exam, recent data reviewed with pt, and pt to continue medical treatment as before,  to f/u any worsening symptoms or concerns BP Readings from Last 3 Encounters:  06/18/13 132/84  12/12/12 132/80  08/07/12 120/80

## 2013-06-18 NOTE — Assessment & Plan Note (Addendum)
Unclear eitology, s/p extensive eval GI 2014, ok to decr the ptoronix to qd only (ins wont pay for bid anyway), for f/u cbc

## 2013-12-17 ENCOUNTER — Encounter: Payer: Self-pay | Admitting: Internal Medicine

## 2013-12-17 ENCOUNTER — Other Ambulatory Visit (INDEPENDENT_AMBULATORY_CARE_PROVIDER_SITE_OTHER): Payer: BC Managed Care – PPO

## 2013-12-17 ENCOUNTER — Ambulatory Visit (INDEPENDENT_AMBULATORY_CARE_PROVIDER_SITE_OTHER): Payer: BC Managed Care – PPO | Admitting: Internal Medicine

## 2013-12-17 VITALS — BP 142/86 | HR 88 | Temp 98.1°F | Ht 66.0 in | Wt 250.2 lb

## 2013-12-17 DIAGNOSIS — L259 Unspecified contact dermatitis, unspecified cause: Secondary | ICD-10-CM

## 2013-12-17 DIAGNOSIS — E669 Obesity, unspecified: Secondary | ICD-10-CM

## 2013-12-17 DIAGNOSIS — I10 Essential (primary) hypertension: Secondary | ICD-10-CM

## 2013-12-17 DIAGNOSIS — J069 Acute upper respiratory infection, unspecified: Secondary | ICD-10-CM

## 2013-12-17 DIAGNOSIS — Z0000 Encounter for general adult medical examination without abnormal findings: Secondary | ICD-10-CM

## 2013-12-17 DIAGNOSIS — L309 Dermatitis, unspecified: Secondary | ICD-10-CM

## 2013-12-17 LAB — BASIC METABOLIC PANEL
BUN: 16 mg/dL (ref 6–23)
CALCIUM: 9.3 mg/dL (ref 8.4–10.5)
CO2: 30 meq/L (ref 19–32)
Chloride: 105 mEq/L (ref 96–112)
Creatinine, Ser: 0.9 mg/dL (ref 0.4–1.2)
GFR: 79.8 mL/min (ref >60.00)
Glucose, Bld: 90 mg/dL (ref 70–99)
Potassium: 4.2 mEq/L (ref 3.5–5.1)
SODIUM: 140 meq/L (ref 135–145)

## 2013-12-17 LAB — CBC WITH DIFFERENTIAL/PLATELET
BASOS ABS: 0 10*3/uL (ref 0.0–0.1)
BASOS PCT: 0.4 % (ref 0.0–3.0)
Eosinophils Absolute: 0.4 10*3/uL (ref 0.0–0.7)
Eosinophils Relative: 6.8 % — ABNORMAL HIGH (ref 0.0–5.0)
HEMATOCRIT: 42.3 % (ref 36.0–46.0)
HEMOGLOBIN: 13.8 g/dL (ref 12.0–15.0)
LYMPHS PCT: 29.4 % (ref 12.0–46.0)
Lymphs Abs: 1.6 10*3/uL (ref 0.7–4.0)
MCHC: 32.6 g/dL (ref 30.0–36.0)
MCV: 84.6 fl (ref 78.0–100.0)
MONOS PCT: 11.7 % (ref 3.0–12.0)
Monocytes Absolute: 0.6 10*3/uL (ref 0.1–1.0)
Neutro Abs: 2.7 10*3/uL (ref 1.4–7.7)
Neutrophils Relative %: 51.7 % (ref 43.0–77.0)
Platelets: 129 10*3/uL — ABNORMAL LOW (ref 150.0–400.0)
RBC: 5 Mil/uL (ref 3.87–5.11)
RDW: 16.1 % — AB (ref 11.5–15.5)
WBC: 5.3 10*3/uL (ref 4.0–10.5)

## 2013-12-17 LAB — URINALYSIS, ROUTINE W REFLEX MICROSCOPIC
Bilirubin Urine: NEGATIVE
Hgb urine dipstick: NEGATIVE
Ketones, ur: NEGATIVE
Leukocytes, UA: NEGATIVE
Nitrite: NEGATIVE
Specific Gravity, Urine: 1.015 (ref 1.000–1.030)
Total Protein, Urine: NEGATIVE
UROBILINOGEN UA: 0.2 (ref 0.0–1.0)
Urine Glucose: NEGATIVE
pH: 6 (ref 5.0–8.0)

## 2013-12-17 LAB — TSH: TSH: 1.72 u[IU]/mL (ref 0.35–4.50)

## 2013-12-17 LAB — LIPID PANEL
Cholesterol: 148 mg/dL (ref 0–200)
HDL: 59.1 mg/dL (ref >39.00)
LDL CALC: 74 mg/dL (ref 0–99)
NonHDL: 88.9
Total CHOL/HDL Ratio: 3
Triglycerides: 76 mg/dL (ref 0.0–149.0)
VLDL: 15.2 mg/dL (ref 0.0–40.0)

## 2013-12-17 LAB — HEPATIC FUNCTION PANEL
ALBUMIN: 3.6 g/dL (ref 3.5–5.2)
ALK PHOS: 50 U/L (ref 39–117)
ALT: 35 U/L (ref 0–35)
AST: 31 U/L (ref 0–37)
Bilirubin, Direct: 0.1 mg/dL (ref 0.0–0.3)
TOTAL PROTEIN: 6.9 g/dL (ref 6.0–8.3)
Total Bilirubin: 0.8 mg/dL (ref 0.2–1.2)

## 2013-12-17 MED ORDER — TRIAMCINOLONE ACETONIDE 0.1 % EX CREA: TOPICAL_CREAM | CUTANEOUS | Status: DC

## 2013-12-17 MED ORDER — AZITHROMYCIN 250 MG PO TABS: ORAL_TABLET | ORAL | Status: DC

## 2013-12-17 MED ORDER — PHENTERMINE HCL 37.5 MG PO CAPS: 37.5000 mg | ORAL_CAPSULE | ORAL | Status: DC

## 2013-12-17 MED ORDER — ATORVASTATIN CALCIUM 20 MG PO TABS: 20.0000 mg | ORAL_TABLET | Freq: Every day | ORAL | Status: DC

## 2013-12-17 NOTE — Assessment & Plan Note (Signed)

## 2013-12-17 NOTE — Assessment & Plan Note (Signed)
Mild to mod, for antibx course,  to f/u any worsening symptoms or concerns 

## 2013-12-17 NOTE — Assessment & Plan Note (Signed)
stable overall by history and exam, recent data reviewed with pt, and pt to continue medical treatment as before,  to f/u any worsening symptoms or concerns BP Readings from Last 3 Encounters:  12/17/13 142/86  06/18/13 132/84  12/12/12 132/80

## 2013-12-17 NOTE — Progress Notes (Signed)
Subjective:    Patient ID: Bethany Lopez, female    DOB: May 08, 1950, 64 y.o.   MRN: [id number]  HPI Here for wellness and f/u;  Overall doing ok;  Pt denies CP, worsening SOB, DOE, wheezing, orthopnea, PND, worsening LE edema, palpitations, dizziness or syncope.  Pt denies neurological change such as new headache, facial or extremity weakness.  Pt denies polydipsia, polyuria, or low sugar symptoms. Pt states overall good compliance with treatment and medications, good tolerability, and has been trying to follow lower cholesterol diet.  Pt denies worsening depressive symptoms, suicidal ideation or panic. No fever, night sweats, wt loss, loss of appetite, or other constitutional symptoms.  Pt states good ability with ADL's, has low fall risk, home safety reviewed and adequate, no other significant changes in hearing or vision, and only occasionally active with exercise.  No current complaints.  Plans to get more exercise soon. Asks for refil phentermine.  BP at home usually better than here, does not want further increased meds if she is losing wt. Here with acute onset mild to mod 2-3 days ST, HA, general weakness and malaise, with prod cough greenish sputum.  Still working full time Declines flu shot here, will get at work. Past Medical History  Diagnosis Date  . Hypertension   . Obesity   . Tobacco abuse   . Hyperlipidemia 05/30/2011    NEC/NOS    . Asthma   . Allergic rhinitis, cause unspecified 05/01/2012   Past Surgical History  Procedure Laterality Date  . Arthroscopic right knee      5-6 yrs ago  . Tubal ligation      reports that she has been smoking Cigarettes.  She has been smoking about 0.50 packs per day. She has never used smokeless tobacco. She reports that she drinks alcohol. She reports that she does not use illicit drugs. family history includes Diabetes in her brother, mother, and sister; Heart disease in her father; Kidney disease in her mother. Allergies  Allergen  Reactions  . Lodine [Etodolac] Other (See Comments)    GI upset   Current Outpatient Prescriptions on File Prior to Visit  Medication Sig Dispense Refill  . amLODipine (NORVASC) 5 MG tablet Take 1 tablet (5 mg total) by mouth daily.  90 tablet  11  . b complex vitamins tablet Take 1 tablet by mouth daily.      Marland Kitchen buPROPion (WELLBUTRIN XL) 150 MG 24 hr tablet Take 1 tablet (150 mg total) by mouth daily.  30 tablet  3  . hydrOXYzine (ATARAX/VISTARIL) 10 MG tablet 1-2 tabs by mouth every 6 hrs as needed for itching  60 tablet  1  . Multiple Vitamin (MULTIVITAMIN) tablet Take 1 tablet by mouth daily.      Marland Kitchen omeprazole (PRILOSEC) 20 MG capsule Take 1 capsule (20 mg total) by mouth daily.  90 capsule  3  . pantoprazole (PROTONIX) 40 MG tablet Take 1 tablet (40 mg total) by mouth 2 (two) times daily.  180 tablet  3  . triamcinolone cream (KENALOG) 0.1 % APPLY TO AFFECTED AREA TWICE DAILY  30 g  1   No current facility-administered medications on file prior to visit.    Review of Systems Constitutional: Negative for increased diaphoresis, other activity, appetite or other siginficant weight change  HENT: Negative for worsening hearing loss, ear pain, facial swelling, mouth sores and neck stiffness.   Eyes: Negative for other worsening pain, redness or visual disturbance.  Respiratory: Negative for shortness of  breath and wheezing.   Cardiovascular: Negative for chest pain and palpitations.  Gastrointestinal: Negative for diarrhea, blood in stool, abdominal distention or other pain Genitourinary: Negative for hematuria, flank pain or change in urine volume.  Musculoskeletal: Negative for myalgias or other joint complaints.  Skin: Negative for color change and wound.  Neurological: Negative for syncope and numbness. other than noted Hematological: Negative for adenopathy. or other swelling Psychiatric/Behavioral: Negative for hallucinations, self-injury, decreased concentration or other worsening  agitation.      Objective:   Physical Exam BP 142/86  Pulse 88  Temp(Src) 98.1 F (36.7 C) (Oral)  Ht 5\' 6"  (1.676 m)  Wt 250 lb 4 oz (113.513 kg)  BMI 40.41 kg/m2  SpO2 94% VS noted,  Constitutional: Pt is oriented to person, place, and time. Appears well-developed and well-nourished.  Head: Normocephalic and atraumatic.  Right Ear: External ear normal.  Left Ear: External ear normal.  Nose: Nose normal.  Mouth/Throat: Oropharynx is clear and moist.  Eyes: Conjunctivae and EOM are normal. Pupils are equal, round, and reactive to light.  Neck: Normal range of motion. Neck supple. No JVD present. No tracheal deviation present.  Cardiovascular: Normal rate, regular rhythm, normal heart sounds and intact distal pulses.   Pulmonary/Chest: Effort normal and breath sounds without rales or wheezing  Abdominal: Soft. Bowel sounds are normal. NT. No HSM  Musculoskeletal: Normal range of motion. Exhibits no edema.  Lymphadenopathy:  Has no cervical adenopathy.  Neurological: Pt is alert and oriented to person, place, and time. Pt has normal reflexes. No cranial nerve deficit. Motor grossly intact Skin: Skin is warm and dry. No rash noted.but has irritative rash recurrent suggestive of eczema  Psychiatric:  Has normal mood and affect. Behavior is normal.      Assessment & Plan:

## 2013-12-17 NOTE — Assessment & Plan Note (Signed)
Ok for refill kenalog cr prn

## 2013-12-17 NOTE — Progress Notes (Signed)
Pre visit review using our clinic review tool, if applicable. No additional management support is needed unless otherwise documented below in the visit note. 

## 2013-12-17 NOTE — Patient Instructions (Addendum)
Please take all new medication as prescribed - the antibiotic (sent to [facility]), and the phentermine wt loss pill (given in hardcopy)  Please continue all other medications as before, and refills have been done if requested - the steroid cream  Please have the pharmacy call with any other refills you may need.  Please continue your efforts at being more active, low cholesterol diet, and weight control.  You are otherwise up to date with prevention measures today.  Please keep your appointments with your specialists as you may have planned  Please go to the LAB in the Basement (turn left off the elevator) for the tests to be done today  You will be contacted by phone if any changes need to be made immediately.  Otherwise, you will receive a letter about your results with an explanation, but please check with MyChart first.  Please remember to sign up for MyChart if you have not done so, as this will be important to you in the future with finding out test results, communicating by private email, and scheduling acute appointments online when needed.  Please remember to followup with your GYN for the yearly pap smear and/or mammogram  Please return in 6 months, or sooner if needed

## 2013-12-17 NOTE — Assessment & Plan Note (Signed)
For repeat phenetermine 30 qd, last tx 2 yrs ago

## 2014-06-11 ENCOUNTER — Ambulatory Visit: Payer: Self-pay | Admitting: Internal Medicine

## 2014-06-17 ENCOUNTER — Encounter: Payer: Self-pay | Admitting: Internal Medicine

## 2014-06-17 ENCOUNTER — Other Ambulatory Visit (INDEPENDENT_AMBULATORY_CARE_PROVIDER_SITE_OTHER): Payer: BLUE CROSS/BLUE SHIELD

## 2014-06-17 ENCOUNTER — Ambulatory Visit (INDEPENDENT_AMBULATORY_CARE_PROVIDER_SITE_OTHER): Payer: BLUE CROSS/BLUE SHIELD | Admitting: Internal Medicine

## 2014-06-17 VITALS — BP 140/98 | HR 78 | Temp 97.9°F | Ht 66.0 in | Wt 243.0 lb

## 2014-06-17 DIAGNOSIS — Z0000 Encounter for general adult medical examination without abnormal findings: Secondary | ICD-10-CM

## 2014-06-17 DIAGNOSIS — M7989 Other specified soft tissue disorders: Secondary | ICD-10-CM

## 2014-06-17 DIAGNOSIS — M79672 Pain in left foot: Secondary | ICD-10-CM

## 2014-06-17 DIAGNOSIS — M79675 Pain in left toe(s): Secondary | ICD-10-CM

## 2014-06-17 DIAGNOSIS — E669 Obesity, unspecified: Secondary | ICD-10-CM

## 2014-06-17 DIAGNOSIS — G5600 Carpal tunnel syndrome, unspecified upper limb: Secondary | ICD-10-CM | POA: Insufficient documentation

## 2014-06-17 DIAGNOSIS — Z0189 Encounter for other specified special examinations: Secondary | ICD-10-CM

## 2014-06-17 DIAGNOSIS — G5602 Carpal tunnel syndrome, left upper limb: Secondary | ICD-10-CM

## 2014-06-17 DIAGNOSIS — I10 Essential (primary) hypertension: Secondary | ICD-10-CM

## 2014-06-17 HISTORY — DX: Carpal tunnel syndrome, unspecified upper limb: G56.00

## 2014-06-17 LAB — HEPATIC FUNCTION PANEL
ALBUMIN: 4 g/dL (ref 3.5–5.2)
ALT: 35 U/L (ref 0–35)
AST: 31 U/L (ref 0–37)
Alkaline Phosphatase: 54 U/L (ref 39–117)
BILIRUBIN DIRECT: 0.2 mg/dL (ref 0.0–0.3)
Total Bilirubin: 0.8 mg/dL (ref 0.2–1.2)
Total Protein: 7.4 g/dL (ref 6.0–8.3)

## 2014-06-17 LAB — URINALYSIS, ROUTINE W REFLEX MICROSCOPIC
Bilirubin Urine: NEGATIVE
Hgb urine dipstick: NEGATIVE
KETONES UR: NEGATIVE
Leukocytes, UA: NEGATIVE
Nitrite: NEGATIVE
RBC / HPF: NONE SEEN (ref 0–?)
SPECIFIC GRAVITY, URINE: 1.02 (ref 1.000–1.030)
TOTAL PROTEIN, URINE-UPE24: NEGATIVE
Urine Glucose: NEGATIVE
Urobilinogen, UA: 0.2 (ref 0.0–1.0)
pH: 6 (ref 5.0–8.0)

## 2014-06-17 LAB — CBC WITH DIFFERENTIAL/PLATELET
BASOS PCT: 0.6 % (ref 0.0–3.0)
Basophils Absolute: 0 10*3/uL (ref 0.0–0.1)
Eosinophils Absolute: 0.3 10*3/uL (ref 0.0–0.7)
Eosinophils Relative: 4.9 % (ref 0.0–5.0)
HCT: 44.9 % (ref 36.0–46.0)
Hemoglobin: 14.9 g/dL (ref 12.0–15.0)
LYMPHS PCT: 31.5 % (ref 12.0–46.0)
Lymphs Abs: 1.8 10*3/uL (ref 0.7–4.0)
MCHC: 33.2 g/dL (ref 30.0–36.0)
MCV: 81.7 fl (ref 78.0–100.0)
MONO ABS: 0.6 10*3/uL (ref 0.1–1.0)
Monocytes Relative: 10.4 % (ref 3.0–12.0)
Neutro Abs: 3 10*3/uL (ref 1.4–7.7)
Neutrophils Relative %: 52.6 % (ref 43.0–77.0)
Platelets: 132 10*3/uL — ABNORMAL LOW (ref 150.0–400.0)
RBC: 5.5 Mil/uL — ABNORMAL HIGH (ref 3.87–5.11)
RDW: 15.9 % — ABNORMAL HIGH (ref 11.5–15.5)
WBC: 5.7 10*3/uL (ref 4.0–10.5)

## 2014-06-17 LAB — LIPID PANEL
CHOL/HDL RATIO: 2
Cholesterol: 145 mg/dL (ref 0–200)
HDL: 61.4 mg/dL (ref >39.00)
LDL Cholesterol: 61 mg/dL (ref 0–99)
NonHDL: 83.6
Triglycerides: 111 mg/dL (ref 0.0–149.0)
VLDL: 22.2 mg/dL (ref 0.0–40.0)

## 2014-06-17 LAB — BASIC METABOLIC PANEL
BUN: 18 mg/dL (ref 6–23)
CO2: 30 mEq/L (ref 19–32)
Calcium: 10 mg/dL (ref 8.4–10.5)
Chloride: 104 mEq/L (ref 96–112)
Creatinine, Ser: 0.9 mg/dL (ref 0.40–1.20)
GFR: 80.69 mL/min (ref >60.00)
Glucose, Bld: 85 mg/dL (ref 70–99)
POTASSIUM: 4.3 meq/L (ref 3.5–5.1)
SODIUM: 138 meq/L (ref 135–145)

## 2014-06-17 LAB — TSH: TSH: 1.54 u[IU]/mL (ref 0.35–4.50)

## 2014-06-17 MED ORDER — PHENTERMINE HCL 37.5 MG PO CAPS: 37.5000 mg | ORAL_CAPSULE | ORAL | Status: DC

## 2014-06-17 MED ORDER — AMLODIPINE BESYLATE 10 MG PO TABS: 10.0000 mg | ORAL_TABLET | Freq: Every day | ORAL | Status: DC

## 2014-06-17 MED ORDER — METHYLPREDNISOLONE ACETATE 80 MG/ML IJ SUSP
80.0000 mg | Freq: Once | INTRAMUSCULAR | Status: AC
  Administered 2014-06-17: 80 mg via INTRAMUSCULAR

## 2014-06-17 NOTE — Assessment & Plan Note (Signed)
?   Gout vs tendonitis vs other - for depomedrol IM x 1 empiric, tylenol prn, consider podiatry if not improved

## 2014-06-17 NOTE — Addendum Note (Signed)
Addended by: Biagio Borg on: 06/17/2014 10:12 AM   Modules accepted: Orders

## 2014-06-17 NOTE — Patient Instructions (Addendum)
You had the steroid shot today  OK to increase the amlodipine to 10 mg per day for blood pressure  Please obtain and wear a left wrist splint for your pharmacy to only wear at night to help prevent the nerve pain in the left hand   Please continue all other medications as before, and refills have been done if requested.  Please have the pharmacy call with any other refills you may need.  Please continue your efforts at being more active, low cholesterol diet, and weight control.  Please keep your appointments with your specialists as you may have planned  Please return in 6 months, or sooner if needed, with Lab testing done 3-5 days before

## 2014-06-17 NOTE — Addendum Note (Signed)
Addended by: Biagio Borg on: 06/17/2014 10:18 AM   Modules accepted: Orders

## 2014-06-17 NOTE — Progress Notes (Signed)
Pre visit review using our clinic review tool, if applicable. No additional management support is needed unless otherwise documented below in the visit note. 

## 2014-06-17 NOTE — Assessment & Plan Note (Signed)
Mild, for left wrist splint qhs prn,  to f/u any worsening symptoms or concerns

## 2014-06-17 NOTE — Assessment & Plan Note (Signed)
Uncontrolled, o/w stable overall by history and exam, recent data reviewed with pt,  to f/u any worsening symptoms or concerns BP Readings from Last 3 Encounters:  06/17/14 140/98  12/17/13 142/86  06/18/13 132/84   To increase the amlodipine to 10 mg, fu BP at home and next visit

## 2014-06-17 NOTE — Addendum Note (Signed)
Addended by: Biagio Borg on: 06/17/2014 09:45 AM   Modules accepted: Orders

## 2014-06-17 NOTE — Assessment & Plan Note (Signed)
[place] for phenetermiine refill,  to f/u any worsening symptoms or concerns

## 2014-06-17 NOTE — Progress Notes (Signed)
Subjective:    Patient ID: Bethany Lopez, female    DOB: 02-27-1951, 64 y.o.   MRN: [id number]  HPI  Here with acute onset swelling second toe left foot x 2-3 wk, mod with pain, no ulcer but appears diffuse swelling and tender at the MTP site, no hx of trauma, fever, or previous gout.  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  Also, Does have several wks ongoing nasal allergy symptoms with clearish congestion, itch and sneezing, without fever, pain, ST, cough, swelling or wheezing, but better with recent flonase re-start.  BP at home has been mild elev, taking 5 mg amlod only.   Pt denies chest pain, increased sob or doe, wheezing, orthopnea, PND, increased LE swelling, palpitations, dizziness or syncope.  Pt denies new neurological symptoms such as new headache, or facial or extremity weakness or numbness  Reuqests reifll on phenetermine as has worked to a degree for wt loss, also cont to work on diet and activity Past Medical History  Diagnosis Date  . Hypertension   . Obesity   . Tobacco abuse   . Hyperlipidemia 05/30/2011    NEC/NOS    . Asthma   . Allergic rhinitis, cause unspecified 05/01/2012   Past Surgical History  Procedure Laterality Date  . Arthroscopic right knee      5-6 yrs ago  . Tubal ligation      reports that she has been smoking Cigarettes.  She has been smoking about 0.50 packs per day. She has never used smokeless tobacco. She reports that she drinks alcohol. She reports that she does not use illicit drugs. family history includes Diabetes in her brother, mother, and sister; Heart disease in her father; Kidney disease in her mother. Allergies  Allergen Reactions  . Lodine [Etodolac] Other (See Comments)    GI upset   Current Outpatient Prescriptions on File Prior to Visit    Medication Sig Dispense Refill  . atorvastatin (LIPITOR) 20 MG tablet Take 1 tablet (20 mg total) by mouth daily. 90 tablet 3  . azithromycin (ZITHROMAX Z-PAK) 250 MG tablet Use as directed 6 tablet 1  . b complex vitamins tablet Take 1 tablet by mouth daily.    . hydrOXYzine (ATARAX/VISTARIL) 10 MG tablet 1-2 tabs by mouth every 6 hrs as needed for itching 60 tablet 1  . Multiple Vitamin (MULTIVITAMIN) tablet Take 1 tablet by mouth daily.    Marland Kitchen omeprazole (PRILOSEC) 20 MG capsule Take 1 capsule (20 mg total) by mouth daily. 90 capsule 3  . pantoprazole (PROTONIX) 40 MG tablet Take 1 tablet (40 mg total) by mouth 2 (two) times daily. 180 tablet 3  . phentermine 37.5 MG capsule Take 1 capsule (37.5 mg total) by mouth every morning. 30 capsule 2  . triamcinolone cream (KENALOG) 0.1 % APPLY TO AFFECTED AREA TWICE DAILY as needed 30 g 1  . buPROPion (WELLBUTRIN XL) 150 MG 24 hr tablet Take 1 tablet (150 mg total) by mouth daily. 30 tablet 3   No current facility-administered medications on file prior to visit.      Review of Systems  Constitutional: Negative for unusual diaphoresis or other sweats  HENT: Negative for ringing in ear Eyes: Negative for double vision or worsening visual disturbance.  Respiratory: Negative for choking and stridor.  Gastrointestinal: Negative for vomiting or other signifcant bowel change Genitourinary: Negative for hematuria or decreased urine volume.  Musculoskeletal: Negative for other MSK pain or swelling Skin: Negative for color change and worsening wound.  Neurological: Negative for tremors and numbness other than noted  Psychiatric/Behavioral: Negative for decreased concentration or agitation other than above       Objective:   Physical Exam BP 140/98 mmHg  Pulse 78  Temp(Src) 97.9 F (36.6 C) (Oral)  Ht 5\' 6"  (1.676 m)  Wt 243 lb (110.224 kg)  BMI 39.24 kg/m2  SpO2 98% VS noted,  Constitutional: Pt appears well-developed, well-nourished.   HENT: Head: NCAT.  Right Ear: External ear normal.  Left Ear: External ear normal.  Eyes: . Pupils are equal, round, and reactive to light. Conjunctivae and EOM are normal Neck: Normal range of motion. Neck supple.  Cardiovascular: Normal rate and regular rhythm.   Pulmonary/Chest: Effort normal and breath sounds without rales or wheezing.  Abd:  Soft, NT, ND, + BS Neurological: Pt is alert. Not confused , motor grossly intact Skin: Skin is warm. No rash Psychiatric: Pt behavior is normal. No agitation.     BP Readings from Last 3 Encounters:  06/17/14 140/98  12/17/13 142/86  06/18/13 132/84   Wt Readings from Last 3 Encounters:  06/17/14 243 lb (110.224 kg)  12/17/13 250 lb 4 oz (113.513 kg)  06/18/13 248 lb 6 oz (112.662 kg)        Assessment & Plan:

## 2014-06-18 ENCOUNTER — Telehealth: Payer: Self-pay | Admitting: Internal Medicine

## 2014-06-18 NOTE — Telephone Encounter (Signed)
emmi emailed °

## 2014-06-19 ENCOUNTER — Other Ambulatory Visit: Payer: Self-pay

## 2014-06-19 ENCOUNTER — Telehealth: Payer: Self-pay | Admitting: Internal Medicine

## 2014-06-19 MED ORDER — ATORVASTATIN CALCIUM 20 MG PO TABS: 20.0000 mg | ORAL_TABLET | Freq: Every day | ORAL | Status: DC

## 2014-06-19 MED ORDER — FLUTICASONE PROPIONATE 50 MCG/ACT NA SUSP: 2.0000 | Freq: Every day | NASAL | Status: DC

## 2014-06-19 NOTE — Telephone Encounter (Signed)
Rx done and pt. Notified.

## 2014-06-19 NOTE — Telephone Encounter (Signed)
Pt called stated Dr. Jenny Reichmann suppose to send in her nasal spray and diet pill to [facility], they do not have it. Pt aslo stated that they resend the result of her mamma gram to Dr. Jenny Reichmann yesterday. FYI

## 2014-06-19 NOTE — Telephone Encounter (Signed)
Pt called and stated she need Atorvastatin to be send into [facility] as well.

## 2014-06-19 NOTE — Telephone Encounter (Signed)
emmi mailed  °

## 2014-06-19 NOTE — Telephone Encounter (Signed)
Rx done. 

## 2014-06-23 ENCOUNTER — Encounter: Payer: Self-pay | Admitting: Internal Medicine

## 2014-07-02 ENCOUNTER — Telehealth: Payer: Self-pay

## 2014-07-02 ENCOUNTER — Telehealth: Payer: Self-pay | Admitting: Internal Medicine

## 2014-07-02 NOTE — Telephone Encounter (Signed)
Pt recd flu vaccine in sept 2015

## 2014-07-02 NOTE — Telephone Encounter (Signed)
Ok for [employer] or zyrtec otx prn,  to f/u any worsening symptoms or concerns

## 2014-07-02 NOTE — Telephone Encounter (Signed)
Pt called stated that she need something for allergy because she has yellow mucus, pt stated that Dr. Jenny Reichmann gave her a nose spray but it is not helping. Please help

## 2014-08-07 NOTE — Consult Note (Signed)
PATIENT NAME:  Bethany Lopez, Bethany Lopez MR#:  [id number] DATE OF BIRTH:  Jul 21, 1950  DATE OF CONSULTATION:  11/12/2012  REFERRING PHYSICIAN:  Tana Conch. Leslye Peer, MD CONSULTING PHYSICIAN:  Andria Meuse, NP, and Lucilla Lame, MD  PREVIOUS GASTROENTEROLOGIST: Lucio Edward, MD  PRIMARY CARE PHYSICIAN: Biagio Borg, MD  CURRENT PHYSICIAN CONSULTANT:   REASON FOR CONSULTATION: Gastrointestinal bleed.   HISTORY OF PRESENT ILLNESS: Ms. Azzie Roup is a 64 year old black female with history of hypertension and hyperlipidemia as well as GI bleed. She states over the last 2 weeks she has had progressive weakness. She resumed iron and stool softeners and felt a little bit better. The other day, she left work because she had profound weakness and fatigue and went to the [facility]. She has had dark melenic stools for at least 2 weeks. However, she notes her stools have always been dark since she has been on iron. She denies any abdominal pain, nausea or vomiting. Denies any heartburn, indigestion, dysphagia or odynophagia. She denies any anorexia. Her weight has been stable. She denies any fever or chills, constipation or diarrhea. In January and February 2014, she had a GI bleed where her hemoglobin was 8.1, platelet count 79 and INR 1.1 She had an EGD by Dr. Fuller Plan and was found to have diffuse gastritis, a hiatal hernia and esophageal stricture. She reports a normal colonoscopy about 2 years ago. On 12/12/2010, she was found to have internal hemorrhoids and diverticulosis of the sigmoid colon and descending colon by Dr. Vira Agar. She has had shortness of breath on exertion, dizziness, but denies any palpitations. She consumes wine daily, occasionally more. She has been taking Bayer Aspirin Plus which is 500 mg of aspirin 2 p.o. q.4 hours for knee pain, alternating with Excedrin p.r.n. She did not realize that this was too significant amount of aspirin to be taking. Her current hemoglobin is 7.5. It was 7.1  yesterday. Her platelet count is 59. INR 1.1. LFTs normal. Iron saturation 13%. UIBC 270, TIBC 311, ferritin 55.   PAST MEDICAL AND SURGICAL HISTORY:  1. Diffuse gastritis, hiatal hernia and esophageal stricture on EGD by Dr. Fuller Plan, February 2014. 2. Chronic anemia and thrombocytopenia.  3. Hypertension.  4. Hyperlipidemia.  5. Knee surgery.   MEDICATIONS PRIOR TO ADMISSION:  1. Triamcinolone cream.  2. Omeprazole 40 mg daily.  3. Multivitamin daily.  4. Fluticasone spray b.i.d. 5. Ferrous sulfate 325 mg daily.  6. B complex 50 mg daily. 7. Atorvastatin 20 mg daily.  8. Amlodipine 5 mg daily.   ALLERGIES: PENICILLIN.   FAMILY HISTORY: There is no known family history of colon carcinoma, liver or chronic GI problems. There is family history of hypertension and hyperlipidemia.   SOCIAL HISTORY: She does occasionally smoke cigarettes. She drinks wine daily, occasionally more than 2 glasses. Denies any illicit drug use. She is married. She lives with her husband. She does not have any children. She works for [employer], a Chief of Staff.   REVIEW OF SYSTEMS: See HPI.  HEENT: She has seasonal allergies with some sneezing, rhinitis and coughing with postnasal drip.  MUSCULOSKELETAL: She has knee pain.  Otherwise, a negative 12-point review of systems.   PHYSICAL EXAMINATION:  VITAL SIGNS: Temperature 97.9, pulse 68, respirations 18, blood pressure 133/83, O2 saturation 100% on room air.  GENERAL: She is an obese black female who is alert, oriented, pleasant and cooperative, in no acute distress.  HEENT: Sclerae clear, nonicteric. Conjunctivae pink. Oropharynx pink and moist without lesions.  NECK: Supple without any mass or thyromegaly.  CHEST: Heart regular rate and rhythm. Normal S1, S2, without murmurs, clicks, rubs or gallops.  LUNGS: Clear to auscultation.  ABDOMEN: Positive bowel sounds x4. No bruits auscultated. Abdomen is soft, nontender, nondistended, without palpable mass or  hepatosplenomegaly. No rebound tenderness or guarding. Exam is limited given the patient's body habitus.  EXTREMITIES: Without clubbing or edema.  SKIN: Warm and dry without any rash or jaundice. NEUROLOGIC: Grossly intact.  MUSCULOSKELETAL: Good equal strength bilaterally.  PSYCHIATRIC: She is alert, oriented, pleasant, cooperative, normal mood and affect.   LABORATORY STUDIES: See HPI. Iron 41, glucose 91, BUN 14, creatinine 0.88, sodium 140, potassium 4.1, chloride 110, CO2 28, calcium 8.4, total protein 6.5, albumin 3.2, total bilirubin 0.4, alkaline phosphatase 53, AST 30, ALT 36. White blood cell count 5.1, hematocrit 24.1, platelets 59.   IMPRESSION: Ms. Wachter is a pleasant 64 year old black female with a recurrent gastrointestinal bleed, melena and anemia. Last gastrointestinal bleed was in January and February 2014, and she had an esophagogastroduodenoscopy without active bleeding, diffuse gastritis and esophageal stricture. Her Helicobacter pylori status is unknown to me. Her last colonoscopy was in August 2012 by Dr. Vira Agar and showed diverticulosis and hemorrhoids. Her hemoglobin went from 8.1 in January to 7.1 yesterday. She also has thrombocytopenia that has not been worked up. Platelets were low at 79 in January and 59 now. She consumes wine daily. No known history of hepatitis or cirrhosis. She does consume toxic amounts of aspirin for arthritis by way of Extra Strength Bayer Plus which is 500 mg of aspirin 2 tablets q.4 hours alternating with Excedrin. She will need an esophagogastroduodenoscopy to look for upper gastrointestinal bleeding. If nothing to explain gastrointestinal blood loss in her upper gastrointestinal tract, she will need capsule study of small bowel.  Plan: 1) EGD today w/ Dr Allen Norris.I have discussed risks/benefits & she agrees with plan.  Consent to be obtained. 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  ____________________________ Andria Meuse, NP klj:OSi D: 11/12/2012 12:11:11 ET T: 11/12/2012 12:25:27 ET JOB#: [id number]  cc: Andria Meuse, NP, <Dictator> Andria Meuse FNP ELECTRONICALLY SIGNED 11/13/2012 15:20

## 2014-08-07 NOTE — Consult Note (Signed)
Brief Consult Note: Diagnosis: melena, anemia, GI bleed.   Patient was seen by consultant.   Consult note dictated.   Comments: Bethany Lopez is a pleasant 64 y/o female with recurrent GI bleed, melena & anemia.  Last GI bleed was in Jan-Feb 2014 & she had an EGD without active bleeding, diffuse gastritis, & esophageal stricture.  Last colonoscpoy 8/12 by Dr Vira Agar showed diverticulosis & hemorrhoids.  Hgb went from 8.1 in Jan to 7.1 yesterday.  She also has thrombocytopenia that has not been worked up.  Platelets were 79 in Jan & 69 now.  She consumes daily wine.  No hx hepatitis or cirrhosis.  She does consume toxic amounts of aspirin for arthritis by way of ES Bayer Plus (500mg  ASA) 2 q 4 hrs alternating with Excedrin.  She will need an EGD to look for UGI bleeding.  If nothing to explain, she will need capsule endoscopy of small bowel.  Plan: 1) EGD today w/ Dr Allen Norris.I have discussed risks/benefits & she agrees with plan.  Consent to be obtained. 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  Please see full dictated note.  #[id number].  Electronic Signatures: Andria Meuse (NP)  (Signed 29-Jul-14 12:14)  Authored: Brief Consult Note   Last Updated: 29-Jul-14 12:14 by Andria Meuse (NP)

## 2014-08-07 NOTE — H&P (Signed)
PATIENT NAME:  Bethany Lopez, Bethany Lopez MR#:  [id number] DATE OF BIRTH:  1950/07/31  DATE OF ADMISSION:  11/11/2012  PRIMARY CARE PHYSICIAN: Nonlocal.   HISTORY OF PRESENT ILLNESS: Bethany Lopez is a 64 year old African American female with history of hypertension and hyperlipidemia. Presented to the Emergency Department with complaints of severe generalized weakness, started in the last 2 weeks. These symptoms have been gradually getting worse to the point that has been experiencing shortness of breath. Also noted to have black stools. The patient takes ibuprofen for right hip pain. The patient had EGD done in January 2013 which showed diffuse gastritis. The colonoscopy at that time was normal. Denies drinking any alcohol. In the Emergency Department, stool occult is grossly positive.   PAST MEDICAL HISTORY:  1. Hypertension.  2. Hyperlipidemia.    ALLERGIES: PENICILLIN.   HOME MEDICATIONS:   1. Triamcinolone local cream.  2. Omeprazole 40 mg once a day.  3. Multivitamin 1 tablet daily  4. Fluticasone 1 spray 2 times a day.  5. Ferrous sulfate 325 mg once a day.  6. B complex 50 mg a day.  7. Atorvastatin 20 mg once a day.  8. Amlodipine 5 mg 1 tablet once a day.   SOCIAL HISTORY: No history of smoking, drinking alcohol or using illicit drugs. Married, lives with her husband.   FAMILY HISTORY: Hypertension, hyperlipidemia.   REVIEW OF SYSTEMS:  CONSTITUTIONAL: Severe generalized weakness, fatigue.  EYES: No change in vision.  ENT: No change in hearing. No tinnitus.  RESPIRATORY: No cough, shortness of breath.  CARDIOVASCULAR: No chest pain, palpitations.  GASTROINTESTINAL: Has mild nausea. Has black stools. No abdominal pain.  GENITOURINARY: No dysuria or hematuria.  SKIN: No rash or lesions.  HEMATOLOGIC: No easy bruising or bleeding.  MUSCULOSKELETAL: No joint pains and aches.  NEUROLOGIC: No or numbness in any part of the body.   PHYSICAL EXAMINATION:  GENERAL: This is a  well-built, well-nourished, age-appropriate female lying down in the bed, quite irritable.  VITAL SIGNS: Temperature 98.4, pulse 84, blood pressure 127/51, respiratory rate of 16, oxygen saturation is 95% on room air.  HEENT: Head normocephalic, atraumatic. There is no scleral icterus. Conjunctivae normal. Pupils equal and react to light. Extraocular movements are intact. Mucous membranes moist. No pharyngeal erythema.   NECK: Supple. No lymphadenopathy. No JVD. No carotid bruit.  CHEST: No focal tenderness.  LUNGS: Bilaterally clear to auscultation.  HEART: S1, S2 regular. No murmurs are heard. No pedal edema. Pulses palpable.   ABDOMEN: Bowel sounds present. Soft, nontender, nondistended. No hepatosplenomegaly.  MUSCULOSKELETAL: Good range of motion in all of the extremities.  SKIN: No rash or lesions.  NEUROLOGIC: The patient is alert, oriented to place, person and time. Cranial nerves II through XII intact. Motor 5/5 in upper and lower extremities.   LABS: CBC: WBC of 6.6, hemoglobin 7.1, platelet count of 69.   CMP is completely within normal limits.   ASSESSMENT AND PLAN: Bethany Lopez is a 64 year old female who comes to the Emergency Department with generalized weakness and is found to have a hemoglobin of 7.1 and stool occult being positive.  1. Gastrointestinal bleed: This does not seem to be gross bleeding, possibly chronic oozing. Keep the patient n.p.o. Continue with intravenous fluids. Keep the patient on Protonix 40 mg b.i.d. Consult gastroenterology in the morning.  2. Hypertension: Continue with home medications.  3. Anemia secondary to acute blood loss: Transfuse 2 units of packed red blood cells.  4. Keep the patient on deep  vein thrombosis prophylaxis with sequential compression devices.   TIME SPENT: 45 minutes.   ____________________________ Monica Becton, MD pv:gb D: 11/12/2012 00:41:17 ET T: 11/12/2012 02:39:56 ET JOB#: [id number]  cc: Monica Becton, MD,  <Dictator> Monica Becton MD ELECTRONICALLY SIGNED 12/18/2012 21:43

## 2014-08-07 NOTE — Discharge Summary (Signed)
PATIENT NAME:  Bethany Lopez, Bethany Lopez MR#:  [id number] DATE OF BIRTH:  1950/12/18  DATE OF ADMISSION:  11/11/2012 DATE OF DISCHARGE:  11/13/2012  For a detailed note, please see the history and physical done on admission by Dr. Lunette Stands.   DIAGNOSES AT DISCHARGE:  1.  Gastrointestinal bleed, likely an upper gastrointestinal bleed, now resolved.  2.  Acute blood loss anemia secondary to upper gastrointestinal bleed.  3.  Thrombocytopenia.  4.  Hypertension.  5.  Hyperlipidemia.   DIET: The patient is being discharged on a low sodium, low fat diet.   ACTIVITY: As tolerated.   FOLLOW-UP: Dr. Lucilla Lame in the next 1 to 2 weeks.   DISCHARGE MEDICATIONS: Amlodipine 5 mg daily, atorvastatin 20 mg daily, iron sulfate 325  mg daily, Flonase one spray to each nostril b.i.d., multivitamin daily, triamcinolone cream to be applied to the affected area b.i.d., vitamin B complex 1 tab daily, omeprazole 40 mg b.i.d.    CONSULTANTS IN THE HOSPITAL COURSE: Dr. Lucilla Lame from gastroenterology.   PERTINENT STUDIES DONE DURING THE HOSPITAL COURSE: An ultrasound of the abdomen showing echodense liver consistent with fatty infiltration; otherwise, normal exam, spleen to be normal.   An upper GI endoscopy done on July 29 showing no signs of fresh or old blood, hiatal hernia, gastritis, a single nonbleeding angiodysplastic lesion in the duodenum.   BRIEF HOSPITAL COURSE: This is a 64 year old female with medical problems as mentioned above, presented to the hospital due to lethargy and found to have hemoglobin with Hemoccult positive stools and a possible GI bleed.  1.  Gastrointestinal bleed. This was likely an upper GI bleed, given her heme-positive stools and her low hemoglobin and her symptomatic anemia. The patient was transfused 1 unit of packed red blood cells and hemoglobin has improved. She has shown no further signs of acute gastrointestinal bleeding; like any hematemesis or any worsening melena or  hematochezia. Gastroenterology consult was obtained. The patient was started on a proton pump inhibitor twice daily and EGD showed no evidence of acute bleeding. Post endoscopy, the patient's hemoglobin has remained stable. She has had no further evidence of nausea, vomiting, hematemesis, is therefore, being discharged on proton pump inhibitor b.i.d. with close follow-up with gastroenterology as an outpatient. She likely would benefit from a pill endoscopy, but that is to be arranged through the gastroenterology clinic as an outpatient.  2.  Acute blood loss anemia. This was secondary to gastrointestinal bleed. The patient has been transfused 1 unit of packed red blood cells. Hemoglobin has improved since transfusion and has remained stable with no further evidence of bleeding. Her hemoglobin further needs to be followed as an outpatient.  3.  Thrombocytopenia. The exact etiology of the patient's thrombocytopenia is unclear. Her platelet counts have remained anywhere in 60,000 to 50,000 range. This could possibly be related to fatty liver disease. Although the hepatitis profiles were negative, her abdominal ultrasound did not show any evidence of splenomegaly. Her serial platelet counts  need to be followed as an outpatient. If she continues to have persistent thrombocytopenia, she likely would benefit from a hematology evaluation as an outpatient.  4.  Hypertension. The patient remained hemodynamically stable on Norvasc and she will resume that.  5.  Hyperlipidemia. The patient was maintained on her atorvastatin and she will also resume that upon discharge.   CODE STATUS: The patient is a full code.   TIME SPENT: 40 minutes   ____________________________ Belia Heman. Verdell Carmine, MD vjs:cc D: 11/13/2012 15:25:12 ET  T: 11/13/2012 16:35:41 ET JOB#: [id number]  cc: Belia Heman. Verdell Carmine, MD, <Dictator> Henreitta Leber MD ELECTRONICALLY SIGNED 11/16/2012 15:12

## 2014-08-07 NOTE — Consult Note (Signed)
Chief Complaint:  Subjective/Chief Complaint Pt denies any nausea, vomiting or abdominal pain.  Denies melena or rectal bleeding.   VITAL SIGNS/ANCILLARY NOTES: **Vital Signs.:   30-Jul-14 03:40  Vital Signs Type Routine  Temperature Temperature (F) 98.4  Celsius 36.8  Temperature Source oral  Pulse Pulse 89  Respirations Respirations 18  Systolic BP Systolic BP 478  Diastolic BP (mmHg) Diastolic BP (mmHg) 68  Mean BP 89  Pulse Ox % Pulse Ox % 97  Pulse Ox Activity Level  At rest  Oxygen Delivery Room Air/ 21 %    07:21  Vital Signs Type Routine  Temperature Temperature (F) 98.4  Celsius 36.8  Temperature Source oral  Pulse Pulse 75  Respirations Respirations 18  Systolic BP Systolic BP 295  Diastolic BP (mmHg) Diastolic BP (mmHg) 75  Mean BP 95  Pulse Ox % Pulse Ox % 100  Pulse Ox Activity Level  At rest  Oxygen Delivery Room Air/ 21 %   Brief Assessment:  GEN well developed, well nourished, no acute distress, A/Ox3   Cardiac Regular   Respiratory normal resp effort   Gastrointestinal details normal Soft  Nontender  Nondistended  No masses palpable  Bowel sounds normal  No rebound tenderness  No gaurding   EXTR negative cyanosis/clubbing, negative edema   Additional Physical Exam skin: warm, dry   Lab Results: Routine Hem:  30-Jul-14 06:03   WBC (CBC) 5.9  RBC (CBC)  2.83  Hemoglobin (CBC)  8.5  Hematocrit (CBC)  26.2  Platelet Count (CBC)  61  MCV 93  MCH 30.2  MCHC 32.6  RDW  23.6  Neutrophil % 69.1  Lymphocyte % 18.2  Monocyte % 7.3  Eosinophil % 4.5  Basophil % 0.9  Neutrophil # 4.0  Lymphocyte # 1.1  Monocyte # 0.4  Eosinophil # 0.3  Basophil # 0.1 (Result(s) reported on 13 Nov 2012 at 06:53AM.)   Assessment/Plan:  Assessment/Plan:  Assessment Chronic GI bleed:  No further melena.  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  Will need GIVENS capsule study as outpatient to further look at small bowel. Anemia: Stable   Plan 1) we will  arrange outpatient GIIVENS capusle 2) PPI daily 3) call if any further bleeding or concerns Will sign off.  Thanks for consult.   Electronic Signatures: Andria Meuse (NP)  (Signed 30-Jul-14 09:49)  Authored: Chief Complaint, VITAL SIGNS/ANCILLARY NOTES, Brief Assessment, Lab Results, Assessment/Plan   Last Updated: 30-Jul-14 09:49 by Andria Meuse (NP)

## 2014-10-08 ENCOUNTER — Telehealth: Payer: Self-pay | Admitting: Internal Medicine

## 2014-10-08 NOTE — Telephone Encounter (Signed)
Patient is calling back. Please give her a call asap

## 2014-10-08 NOTE — Telephone Encounter (Signed)
Called pt back to confirm the reason for the call. She confirmed that is was a possible ear infection. I informed pt that we would not be able to prescribe abx without an OV to prevent abx resistant bacteria. Pt angerly stated thank you and hung up before I had an opportunity to other to schedule her for an OV.

## 2014-10-08 NOTE — Telephone Encounter (Signed)
Patient returned your call. Patient is requesting a call back.   States she is going to call administration because she feels she is not getting a call back fast enough.   Did offer to transfer her to Lou's voicemail but patient states she had another phone number of administration she was going to call.

## 2014-10-08 NOTE — Telephone Encounter (Signed)
Patient states she is having dizzy spells.  She believes it is inner ear.  I did inform patient that she would need an appointment.  Patient does not want to come in for a OV.  She is requesting MD to send script for penicillin to [facility] on [address].

## 2014-10-08 NOTE — Telephone Encounter (Signed)
LVM for pt to call back as soon as possible.   

## 2014-10-12 ENCOUNTER — Other Ambulatory Visit: Payer: Self-pay | Admitting: Internal Medicine

## 2015-02-11 ENCOUNTER — Other Ambulatory Visit (INDEPENDENT_AMBULATORY_CARE_PROVIDER_SITE_OTHER): Payer: BLUE CROSS/BLUE SHIELD

## 2015-02-11 ENCOUNTER — Ambulatory Visit (INDEPENDENT_AMBULATORY_CARE_PROVIDER_SITE_OTHER): Payer: BLUE CROSS/BLUE SHIELD | Admitting: Internal Medicine

## 2015-02-11 ENCOUNTER — Encounter: Payer: Self-pay | Admitting: Internal Medicine

## 2015-02-11 VITALS — BP 138/86 | HR 85 | Temp 97.9°F | Ht 66.0 in | Wt 243.0 lb

## 2015-02-11 DIAGNOSIS — K219 Gastro-esophageal reflux disease without esophagitis: Secondary | ICD-10-CM

## 2015-02-11 DIAGNOSIS — I10 Essential (primary) hypertension: Secondary | ICD-10-CM

## 2015-02-11 DIAGNOSIS — Z0000 Encounter for general adult medical examination without abnormal findings: Secondary | ICD-10-CM

## 2015-02-11 DIAGNOSIS — Z23 Encounter for immunization: Secondary | ICD-10-CM

## 2015-02-11 DIAGNOSIS — M7989 Other specified soft tissue disorders: Secondary | ICD-10-CM | POA: Diagnosis not present

## 2015-02-11 HISTORY — DX: Gastro-esophageal reflux disease without esophagitis: K21.9

## 2015-02-11 LAB — TSH: TSH: 1.21 u[IU]/mL (ref 0.35–4.50)

## 2015-02-11 LAB — CBC WITH DIFFERENTIAL/PLATELET
BASOS PCT: 0.7 % (ref 0.0–3.0)
Basophils Absolute: 0 10*3/uL (ref 0.0–0.1)
EOS ABS: 0.3 10*3/uL (ref 0.0–0.7)
Eosinophils Relative: 6.8 % — ABNORMAL HIGH (ref 0.0–5.0)
HCT: 44.5 % (ref 36.0–46.0)
Hemoglobin: 14.5 g/dL (ref 12.0–15.0)
LYMPHS PCT: 32.9 % (ref 12.0–46.0)
Lymphs Abs: 1.6 10*3/uL (ref 0.7–4.0)
MCHC: 32.6 g/dL (ref 30.0–36.0)
MCV: 83.5 fl (ref 78.0–100.0)
MONO ABS: 0.5 10*3/uL (ref 0.1–1.0)
MONOS PCT: 10.6 % (ref 3.0–12.0)
Neutro Abs: 2.5 10*3/uL (ref 1.4–7.7)
Neutrophils Relative %: 49 % (ref 43.0–77.0)
PLATELETS: 114 10*3/uL — AB (ref 150.0–400.0)
RBC: 5.33 Mil/uL — ABNORMAL HIGH (ref 3.87–5.11)
RDW: 15.9 % — AB (ref 11.5–15.5)
WBC: 5 10*3/uL (ref 4.0–10.5)

## 2015-02-11 LAB — URINALYSIS, ROUTINE W REFLEX MICROSCOPIC
Bilirubin Urine: NEGATIVE
Hgb urine dipstick: NEGATIVE
Ketones, ur: NEGATIVE
LEUKOCYTES UA: NEGATIVE
Nitrite: NEGATIVE
PH: 6 (ref 5.0–8.0)
SPECIFIC GRAVITY, URINE: 1.02 (ref 1.000–1.030)
Total Protein, Urine: NEGATIVE
URINE GLUCOSE: NEGATIVE
Urobilinogen, UA: 0.2 (ref 0.0–1.0)

## 2015-02-11 LAB — LIPID PANEL
CHOL/HDL RATIO: 3
Cholesterol: 150 mg/dL (ref 0–200)
HDL: 58.8 mg/dL (ref >39.00)
LDL CALC: 73 mg/dL (ref 0–99)
NONHDL: 91.35
TRIGLYCERIDES: 94 mg/dL (ref 0.0–149.0)
VLDL: 18.8 mg/dL (ref 0.0–40.0)

## 2015-02-11 LAB — HEPATIC FUNCTION PANEL
ALT: 31 U/L (ref 0–35)
AST: 30 U/L (ref 0–37)
Albumin: 4 g/dL (ref 3.5–5.2)
Alkaline Phosphatase: 52 U/L (ref 39–117)
BILIRUBIN DIRECT: 0.1 mg/dL (ref 0.0–0.3)
BILIRUBIN TOTAL: 0.6 mg/dL (ref 0.2–1.2)
Total Protein: 7.3 g/dL (ref 6.0–8.3)

## 2015-02-11 LAB — BASIC METABOLIC PANEL
BUN: 15 mg/dL (ref 6–23)
CALCIUM: 9.6 mg/dL (ref 8.4–10.5)
CHLORIDE: 105 meq/L (ref 96–112)
CO2: 27 mEq/L (ref 19–32)
CREATININE: 0.9 mg/dL (ref 0.40–1.20)
GFR: 80.53 mL/min (ref >60.00)
Glucose, Bld: 93 mg/dL (ref 70–99)
Potassium: 3.9 mEq/L (ref 3.5–5.1)
Sodium: 141 mEq/L (ref 135–145)

## 2015-02-11 NOTE — Progress Notes (Signed)
Subjective:    Patient ID: Bethany Lopez, female    DOB: 02/03/1951, 64 y.o.   MRN: [id number]  HPI  Here for wellness and f/u;  Overall doing ok;  Pt denies Chest pain, worsening SOB, DOE, wheezing, orthopnea, PND, palpitations, dizziness or syncope, but has had some increased leg swelling in last few months, worse after sitting more at work, and did have an episode of dizziness 2 days ago first thing in the am for several minutes, blames it on a corndog from the fair. No nasal congestion. No recurrence.  Pt denies neurological change such as new headache, facial or extremity weakness.  Pt denies polydipsia, polyuria, or low sugar symptoms. Pt states overall good compliance with treatment and medications, good tolerability, and has been trying to follow appropriate diet.  Pt denies worsening depressive symptoms, suicidal ideation or panic. No fever, night sweats, wt loss, loss of appetite, or other constitutional symptoms.  Pt states good ability with ADL's, has low fall risk, home safety reviewed and adequate, no other significant changes in hearing or vision, and only occasionally active with exercise..Tolerating the amlodipine. Lost some wt with the phentermine but gained it back. No plans to go to the gym  And better diet since the pill did not work.  Almost quit with smoking as well, plans to quit completely soon.  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 Wt Readings from Last 3 Encounters:  02/11/15 243 lb (110.224 kg)  06/17/14 243 lb (110.224 kg)  12/17/13 250 lb 4 oz (113.513 kg)   Past Medical History  Diagnosis Date  . Hypertension   . Obesity   . Tobacco abuse   . Hyperlipidemia 05/30/2011    NEC/NOS    . Asthma   . Allergic rhinitis, cause unspecified 05/01/2012  . Carpal tunnel syndrome 06/17/2014   Past Surgical History  Procedure Laterality Date  . Arthroscopic right knee      5-6 yrs ago  . Tubal ligation      reports that she  has been smoking Cigarettes.  She has been smoking about 0.50 packs per day. She has never used smokeless tobacco. She reports that she drinks alcohol. She reports that she does not use illicit drugs. family history includes Diabetes in her brother, mother, and sister; Heart disease in her father; Kidney disease in her mother. Allergies  Allergen Reactions  . Lodine [Etodolac] Other (See Comments)    GI upset   Current Outpatient Prescriptions on File Prior to Visit  Medication Sig Dispense Refill  . amLODipine (NORVASC) 10 MG tablet Take 1 tablet (10 mg total) by mouth daily. 90 tablet 3  . atorvastatin (LIPITOR) 20 MG tablet Take 1 tablet (20 mg total) by mouth daily. 90 tablet 3  . b complex vitamins tablet Take 1 tablet by mouth daily.    . fluticasone (FLONASE) 50 MCG/ACT nasal spray Place 2 sprays into both nostrils daily. 16 g 5  . Multiple Vitamin (MULTIVITAMIN) tablet Take 1 tablet by mouth daily.    Marland Kitchen azithromycin (ZITHROMAX Z-PAK) 250 MG tablet Use as directed (Patient not taking: Reported on 02/11/2015) 6 tablet 1  . buPROPion (WELLBUTRIN XL) 150 MG 24 hr tablet Take 1 tablet (150 mg total) by mouth daily. 30 tablet 3  . hydrOXYzine (ATARAX/VISTARIL) 10 MG tablet 1-2 tabs by mouth every 6 hrs as needed for itching (Patient not taking: Reported on 02/11/2015) 60 tablet 1  . omeprazole (PRILOSEC) 20 MG  capsule Take 1 capsule (20 mg total) by mouth daily. (Patient not taking: Reported on 02/11/2015) 90 capsule 3  . pantoprazole (PROTONIX) 40 MG tablet Take 1 tablet (40 mg total) by mouth 2 (two) times daily. (Patient not taking: Reported on 02/11/2015) 180 tablet 3  . phentermine 37.5 MG capsule Take 1 capsule (37.5 mg total) by mouth every morning. (Patient not taking: Reported on 02/11/2015) 30 capsule 2  . triamcinolone cream (KENALOG) 0.1 % APPLY TO AFFECTED AREA TWICE DAILY as needed (Patient not taking: Reported on 02/11/2015) 30 g 1   No current facility-administered medications  on file prior to visit.   Review of Systems Constitutional: Negative for increased diaphoresis, other activity, appetite or siginficant weight change other than noted HENT: Negative for worsening hearing loss, ear pain, facial swelling, mouth sores and neck stiffness.   Eyes: Negative for other worsening pain, redness or visual disturbance.  Respiratory: Negative for shortness of breath and wheezing  Cardiovascular: Negative for chest pain and palpitations.  Gastrointestinal: Negative for diarrhea, blood in stool, abdominal distention or other pain Genitourinary: Negative for hematuria, flank pain or change in urine volume.  Musculoskeletal: Negative for myalgias or other joint complaints.  Skin: Negative for color change and wound or drainage.  Neurological: Negative for syncope and numbness. other than noted Hematological: Negative for adenopathy. or other swelling Psychiatric/Behavioral: Negative for hallucinations, SI, self-injury, decreased concentration or other worsening agitation.      Objective:   Physical Exam BP 138/86 mmHg  Pulse 85  Temp(Src) 97.9 F (36.6 C) (Oral)  Ht 5\' 6"  (1.676 m)  Wt 243 lb (110.224 kg)  BMI 39.24 kg/m2  SpO2 99% VS noted,  Constitutional: Pt is oriented to person, place, and time. Appears well-developed and well-nourished, in no significant distress Head: Normocephalic and atraumatic.  Right Ear: External ear normal.  Left Ear: External ear normal.  Nose: Nose normal.  Mouth/Throat: Oropharynx is clear and moist.  Eyes: Conjunctivae and EOM are normal. Pupils are equal, round, and reactive to light.  Neck: Normal range of motion. Neck supple. No JVD present. No tracheal deviation present or significant neck LA or mass Cardiovascular: Normal rate, regular rhythm, normal heart sounds and intact distal pulses.   Pulmonary/Chest: Effort normal and breath sounds without rales or wheezing  Abdominal: Soft. Bowel sounds are normal. NT. No HSM    Musculoskeletal: Normal range of motion. Exhibits 1+ edema left > right.  Lymphadenopathy:  Has no cervical adenopathy.  Neurological: Pt is alert and oriented to person, place, and time. Pt has normal reflexes. No cranial nerve deficit. Motor grossly intact Skin: Skin is warm and dry. No rash noted.  Psychiatric:  Has normal mood and affect. Behavior is normal.     Assessment & Plan:

## 2015-02-11 NOTE — Assessment & Plan Note (Signed)

## 2015-02-11 NOTE — Assessment & Plan Note (Signed)
stable overall by history and exam, recent data reviewed with pt, and pt to continue medical treatment as before,  to f/u any worsening symptoms or concerns BP Readings from Last 3 Encounters:  02/11/15 138/86  06/17/14 140/98  12/17/13 142/86

## 2015-02-11 NOTE — Patient Instructions (Addendum)
You had the flu shot today  Your EKG was OK today  Please return in 2 weeks for the Prevnar 13 pneumonia shot  Please continue all other medications as before, and refills have been done if requested.  Please have the pharmacy call with any other refills you may need.  Please continue your efforts at being more active, low cholesterol diet, and weight control.  You are otherwise up to date with prevention measures today.  Please keep your appointments with your specialists as you may have planned  You will be contacted regarding the referral for: echocardiogram  Please go to the LAB in the Basement (turn left off the elevator) for the tests to be done today  You will be contacted by phone if any changes need to be made immediately.  Otherwise, you will receive a letter about your results with an explanation, but please check with MyChart first.  Please remember to sign up for MyChart if you have not done so, as this will be important to you in the future with finding out test results, communicating by private email, and scheduling acute appointments online when needed.  Please return in 6 months, or sooner if needed

## 2015-02-11 NOTE — Assessment & Plan Note (Signed)
ECG reviewed as per emr, suspect an element of venous insuff but cant r/o other, for echo as well, and labs as ordered, bnp

## 2015-02-11 NOTE — Progress Notes (Signed)
Pre visit review using our clinic review tool, if applicable. No additional management support is needed unless otherwise documented below in the visit note. 

## 2015-02-12 ENCOUNTER — Encounter: Payer: Self-pay | Admitting: Internal Medicine

## 2015-02-18 ENCOUNTER — Ambulatory Visit (INDEPENDENT_AMBULATORY_CARE_PROVIDER_SITE_OTHER): Payer: BLUE CROSS/BLUE SHIELD

## 2015-02-18 DIAGNOSIS — Z23 Encounter for immunization: Secondary | ICD-10-CM

## 2015-02-25 ENCOUNTER — Other Ambulatory Visit: Payer: Self-pay

## 2015-02-25 ENCOUNTER — Encounter: Payer: Self-pay | Admitting: Internal Medicine

## 2015-02-25 ENCOUNTER — Ambulatory Visit (HOSPITAL_COMMUNITY): Payer: BLUE CROSS/BLUE SHIELD | Attending: Internal Medicine

## 2015-02-25 DIAGNOSIS — I10 Essential (primary) hypertension: Secondary | ICD-10-CM | POA: Insufficient documentation

## 2015-02-25 DIAGNOSIS — M7989 Other specified soft tissue disorders: Secondary | ICD-10-CM | POA: Diagnosis not present

## 2015-02-25 DIAGNOSIS — I517 Cardiomegaly: Secondary | ICD-10-CM | POA: Insufficient documentation

## 2015-02-25 DIAGNOSIS — I341 Nonrheumatic mitral (valve) prolapse: Secondary | ICD-10-CM | POA: Insufficient documentation

## 2015-05-30 ENCOUNTER — Emergency Department: Payer: BLUE CROSS/BLUE SHIELD

## 2015-05-30 ENCOUNTER — Emergency Department
Admission: EM | Admit: 2015-05-30 | Discharge: 2015-05-30 | Disposition: A | Discharge status: Home / Self Care | Payer: BLUE CROSS/BLUE SHIELD | Attending: Emergency Medicine | Admitting: Emergency Medicine

## 2015-05-30 ENCOUNTER — Encounter: Payer: Self-pay | Admitting: Emergency Medicine

## 2015-05-30 DIAGNOSIS — J069 Acute upper respiratory infection, unspecified: Secondary | ICD-10-CM | POA: Insufficient documentation

## 2015-05-30 DIAGNOSIS — Z7951 Long term (current) use of inhaled steroids: Secondary | ICD-10-CM | POA: Diagnosis not present

## 2015-05-30 DIAGNOSIS — F17210 Nicotine dependence, cigarettes, uncomplicated: Secondary | ICD-10-CM | POA: Insufficient documentation

## 2015-05-30 DIAGNOSIS — Z79899 Other long term (current) drug therapy: Secondary | ICD-10-CM | POA: Diagnosis not present

## 2015-05-30 DIAGNOSIS — I10 Essential (primary) hypertension: Secondary | ICD-10-CM | POA: Insufficient documentation

## 2015-05-30 DIAGNOSIS — J45901 Unspecified asthma with (acute) exacerbation: Secondary | ICD-10-CM | POA: Insufficient documentation

## 2015-05-30 DIAGNOSIS — R05 Cough: Secondary | ICD-10-CM | POA: Diagnosis present

## 2015-05-30 MED ORDER — PREDNISONE 20 MG PO TABS
60.0000 mg | ORAL_TABLET | Freq: Once | ORAL | Status: AC
  Administered 2015-05-30: 60 mg via ORAL
  Filled 2015-05-30: qty 3

## 2015-05-30 MED ORDER — BENZONATATE 100 MG PO CAPS: 200.0000 mg | ORAL_CAPSULE | Freq: Three times a day (TID) | ORAL | Status: DC | PRN

## 2015-05-30 MED ORDER — IPRATROPIUM-ALBUTEROL 0.5-2.5 (3) MG/3ML IN SOLN
3.0000 mL | Freq: Once | RESPIRATORY_TRACT | Status: AC
  Administered 2015-05-30: 3 mL via RESPIRATORY_TRACT
  Filled 2015-05-30: qty 3

## 2015-05-30 MED ORDER — PREDNISONE 10 MG PO TABS: ORAL_TABLET | ORAL | Status: DC

## 2015-05-30 MED ORDER — ALBUTEROL SULFATE HFA 108 (90 BASE) MCG/ACT IN AERS: 2.0000 | INHALATION_SPRAY | Freq: Four times a day (QID) | RESPIRATORY_TRACT | Status: DC | PRN

## 2015-05-30 NOTE — ED Notes (Addendum)
Pt has had productive cough X 2 days.  Denies any fevers.  Has also had general body aches.  Has been around a lot of people at work that are sick.  Thinks she may have the flu because was also having a headache.

## 2015-05-30 NOTE — ED Provider Notes (Signed)
[facility] Emergency Department Provider Note  ____________________________________________  Time seen: Approximately 10:16 AM  I have reviewed the triage vital signs and the nursing notes.   HISTORY  Chief Complaint Cough  HPI Bethany Lopez is a 65 y.o. female is here with complaint of congestion and reactive cough for approximately 2 weeks. Patient states that close symptoms became worse and last couple days and she has had some shortness of breath that is similar to when she had asthma. She has not used an inhaler in many years and reports some wheezing. Patient has been unaware of any fever but has had some chills. She also a knowledge is a right sided headache that she attributes to her illness at present.Patient continues to smoke on a daily basis. She has taken some over-the-counter medication with minimal relief. She rates her discomfort as an 8 out of 10.   Past Medical History  Diagnosis Date  . Hypertension   . Obesity   . Tobacco abuse   . Hyperlipidemia 05/30/2011    NEC/NOS    . Asthma   . Allergic rhinitis, cause unspecified 05/01/2012  . Carpal tunnel syndrome 06/17/2014  . GERD (gastroesophageal reflux disease) 02/11/2015    Patient Active Problem List   Diagnosis Date Noted  . GERD (gastroesophageal reflux disease) 02/11/2015  . Leg swelling 02/11/2015  . Carpal tunnel syndrome 06/17/2014  . Pain and swelling of toe of left foot 06/17/2014  . Acute upper respiratory infections of unspecified site 06/18/2013  . Thrombocytopenia, unspecified ([place]) 08/07/2012  . Heme positive stool 05/02/2012  . Fatigue 05/02/2012  . Allergic rhinitis, cause unspecified 05/01/2012  . Obesity 05/01/2012  . Anemia, unspecified 04/30/2012  . Preventative health care 12/01/2011  . Tobacco use 08/02/2011  . Hypertension 08/02/2011  . Dermatitis 08/02/2011  . Hyperlipidemia 05/30/2011    Past Surgical History  Procedure Laterality Date  .  Arthroscopic right knee      5-6 yrs ago  . Tubal ligation      Current Outpatient Rx  Name  Route  Sig  Dispense  Refill  . albuterol (PROVENTIL HFA;VENTOLIN HFA) 108 (90 Base) MCG/ACT inhaler   Inhalation   Inhale 2 puffs into the lungs every 6 (six) hours as needed for wheezing or shortness of breath.   1 Inhaler   2   . amLODipine (NORVASC) 10 MG tablet   Oral   Take 1 tablet (10 mg total) by mouth daily.   90 tablet   3   . atorvastatin (LIPITOR) 20 MG tablet   Oral   Take 1 tablet (20 mg total) by mouth daily.   90 tablet   3   . b complex vitamins tablet   Oral   Take 1 tablet by mouth daily.         . benzonatate (TESSALON PERLES) 100 MG capsule   Oral   Take 2 capsules (200 mg total) by mouth 3 (three) times daily as needed for cough.   30 capsule   0   . EXPIRED: buPROPion (WELLBUTRIN XL) 150 MG 24 hr tablet   Oral   Take 1 tablet (150 mg total) by mouth daily.   30 tablet   3   . fluticasone (FLONASE) 50 MCG/ACT nasal spray   Each Nare   Place 2 sprays into both nostrils daily.   16 g   5   . hydrOXYzine (ATARAX/VISTARIL) 10 MG tablet      1-2 tabs by mouth every  6 hrs as needed for itching Patient not taking: Reported on 02/11/2015   60 tablet   1   . Multiple Vitamin (MULTIVITAMIN) tablet   Oral   Take 1 tablet by mouth daily.         Marland Kitchen omeprazole (PRILOSEC) 20 MG capsule   Oral   Take 1 capsule (20 mg total) by mouth daily. Patient not taking: Reported on 02/11/2015   90 capsule   3   . pantoprazole (PROTONIX) 40 MG tablet   Oral   Take 1 tablet (40 mg total) by mouth 2 (two) times daily. Patient not taking: Reported on 02/11/2015   180 tablet   3   . predniSONE (DELTASONE) 10 MG tablet      Take 5  tablets tomorrow, on day 2 take 4 tablets, day 3 take 3 tablets, day 4 take 2 tablets, day 5 take  1 tablets   15 tablet   0     Allergies Lodine  Family History  Problem Relation Age of Onset  . Diabetes Mother   .  Kidney disease Mother     renal failure  . Heart disease Father     congestive heart failure  . Diabetes Sister   . Diabetes Brother     Social History Social History  Substance Use Topics  . Smoking status: Current Every Day Smoker -- 0.50 packs/day    Types: Cigarettes  . Smokeless tobacco: Never Used     Comment: smokes 1 pack every two days  . Alcohol Use: Yes     Comment: occasionally has wine    Review of Systems Constitutional: No fever/positive chills Eyes: No visual changes. ENT: No sore throat. Nasal congestion positive Cardiovascular: Denies chest pain. Respiratory: Positive shortness of breath. Positive wheezing Gastrointestinal: No abdominal pain.  No nausea, no vomiting.   Musculoskeletal: Negative for back pain. Skin: Negative for rash. Neurological: Positive for headaches, no focal weakness or numbness.  10-point ROS otherwise negative.  ____________________________________________   PHYSICAL EXAM:  VITAL SIGNS: ED Triage Vitals  Enc Vitals Group     BP 05/30/15 0925 179/93 mmHg     Pulse Rate 05/30/15 0925 90     Resp 05/30/15 0925 18     Temp 05/30/15 0925 98.7 F (37.1 C)     Temp Source 05/30/15 0925 Oral     SpO2 05/30/15 0925 99 %     Weight 05/30/15 0925 240 lb (108.863 kg)     Height 05/30/15 0925 5\' 6"  (1.676 m)     Head Cir --      Peak Flow --      Pain Score 05/30/15 0926 8     Pain Loc --      Pain Edu? --      Excl. in [place]? --     Constitutional: Alert and oriented. Well appearing and in no acute distress. Eyes: Conjunctivae are normal. PERRL. EOMI. Head: Atraumatic. Nose: No congestion/rhinnorhea. Mouth/Throat: Mucous membranes are moist.  Oropharynx non-erythematous. Neck: No stridor.  Supple Hematological/Lymphatic/Immunilogical: No cervical lymphadenopathy. Cardiovascular: Normal rate, regular rhythm. Grossly normal heart sounds.  Good peripheral circulation. Respiratory: Normal respiratory effort.  No retractions.  Lungs lateral wheezes heard throughout. Patient is able to speak in complete sentences without any difficulty. Gastrointestinal: Soft and nontender. No distention.  Musculoskeletal: Moves upper and lower extremities without any difficulty. Neurologic:  Normal speech and language. No gross focal neurologic deficits are appreciated. No gait instability. Skin:  Skin is warm, dry  and intact. No rash noted. Psychiatric: Mood and affect are normal. Speech and behavior are normal.  ____________________________________________   LABS (all labs ordered are listed, but only abnormal results are displayed)  Labs Reviewed - No data to display  RADIOLOGY  Chest x-ray per radiologist shows no edema or consolidation. ____________________________________________   PROCEDURES  Procedure(s) performed: None  Critical Care performed: No  ____________________________________________   INITIAL IMPRESSION / ASSESSMENT AND PLAN / ED COURSE  Pertinent labs & imaging results that were available during my care of the patient were reviewed by me and considered in my medical decision making (see chart for details).  Patient was markedly improved after receiving prednisone and 2 DuoNeb treatments. Patient was discharged on a continued tapering dose of prednisone and also a albuterol inhaler and Tessalon Perles as needed for cough. Patient is to follow-up with her primary care doctor or the clinic if any continued problems. ____________________________________________   FINAL CLINICAL IMPRESSION(S) / ED DIAGNOSES  Final diagnoses:  Acute upper respiratory infection  Asthma exacerbation      Johnn Hai, PA-C 05/30/15 2005  Eula Listen, MD 05/31/15 1534

## 2015-05-30 NOTE — ED Notes (Signed)
Pt states she developed cold sxs this week and c/o right temporal pain.  She has been taking zyrtec to help with sxs which has provided some relief.  Has a history of asthma as a child. No shortness of breath noted.  Pt states several co-workers also ill.

## 2015-05-30 NOTE — Discharge Instructions (Signed)
Asthma, Adult Asthma is a condition of the lungs in which the airways tighten and narrow. Asthma can make it hard to breathe. Asthma cannot be cured, but medicine and lifestyle changes can help control it. Asthma may be started (triggered) by:  Animal skin flakes (dander).  Dust.  Cockroaches.  Pollen.  Mold.  Smoke.  Cleaning products.  Hair sprays or aerosol sprays.  Paint fumes or strong smells.  Cold air, weather changes, and winds.  Crying or laughing hard.  Stress.  Certain medicines or drugs.  Foods, such as dried fruit, potato chips, and sparkling grape juice.  Infections or conditions (colds, flu).  Exercise.  Certain medical conditions or diseases.  Exercise or tiring activities. HOME CARE   Take medicine as told by your doctor.  Use a peak flow meter as told by your doctor. A peak flow meter is a tool that measures how well the lungs are working.  Record and keep track of the peak flow meter's readings.  Understand and use the asthma action plan. An asthma action plan is a written plan for taking care of your asthma and treating your attacks.  To help prevent asthma attacks:  Do not smoke. Stay away from secondhand smoke.  Change your heating and air conditioning filter often.  Limit your use of fireplaces and wood stoves.  Get rid of pests (such as roaches and mice) and their droppings.  Throw away plants if you see mold on them.  Clean your floors. Dust regularly. Use cleaning products that do not smell.  Have someone vacuum when you are not home. Use a vacuum cleaner with a HEPA filter if possible.  Replace carpet with wood, tile, or vinyl flooring. Carpet can trap animal skin flakes and dust.  Use allergy-proof pillows, mattress covers, and box spring covers.  Wash bed sheets and blankets every week in hot water and dry them in a dryer.  Use blankets that are made of polyester or cotton.  Clean bathrooms and kitchens with bleach.  If possible, have someone repaint the walls in these rooms with mold-resistant paint. Keep out of the rooms that are being cleaned and painted.  Wash hands often. GET HELP IF:  You have make a whistling sound when breaking (wheeze), have shortness of breath, or have a cough even if taking medicine to prevent attacks.  The colored mucus you cough up (sputum) is thicker than usual.  The colored mucus you cough up changes from clear or white to yellow, green, gray, or bloody.  You have problems from the medicine you are taking such as:  A rash.  Itching.  Swelling.  Trouble breathing.  You need reliever medicines more than 2-3 times a week.  Your peak flow measurement is still at 50-79% of your personal best after following the action plan for 1 hour.  You have a fever. GET HELP RIGHT AWAY IF:   You seem to be worse and are not responding to medicine during an asthma attack.  You are short of breath even at rest.  You get short of breath when doing very little activity.  You have trouble eating, drinking, or talking.  You have chest pain.  You have a fast heartbeat.  Your lips or fingernails start to turn blue.  You are light-headed, dizzy, or faint.  Your peak flow is less than 50% of your personal best.   This information is not intended to replace advice given to you by your health care provider. Make sure  you discuss any questions you have with your health care provider.   Document Released: 09/20/2007 Document Revised: 12/23/2014 Document Reviewed: 10/31/2012 Elsevier Interactive Patient Education 2016 [place] with your doctor if any continued problems. Begin prednisone tomorrow as directed. Tessalon as needed for cough. Usually her inhaler as needed for wheezing. Follow-up with your doctor in [place] continued problems.

## 2015-07-07 ENCOUNTER — Other Ambulatory Visit: Payer: Self-pay | Admitting: Internal Medicine

## 2015-08-12 ENCOUNTER — Encounter: Payer: Self-pay | Admitting: Internal Medicine

## 2015-08-12 ENCOUNTER — Ambulatory Visit (INDEPENDENT_AMBULATORY_CARE_PROVIDER_SITE_OTHER): Payer: BLUE CROSS/BLUE SHIELD | Admitting: Internal Medicine

## 2015-08-12 ENCOUNTER — Other Ambulatory Visit (INDEPENDENT_AMBULATORY_CARE_PROVIDER_SITE_OTHER): Payer: BLUE CROSS/BLUE SHIELD

## 2015-08-12 VITALS — BP 136/80 | HR 85 | Temp 98.2°F | Resp 20 | Wt 245.0 lb

## 2015-08-12 DIAGNOSIS — R6889 Other general symptoms and signs: Secondary | ICD-10-CM | POA: Diagnosis not present

## 2015-08-12 DIAGNOSIS — Z0001 Encounter for general adult medical examination with abnormal findings: Secondary | ICD-10-CM | POA: Diagnosis not present

## 2015-08-12 DIAGNOSIS — Z0000 Encounter for general adult medical examination without abnormal findings: Secondary | ICD-10-CM | POA: Diagnosis not present

## 2015-08-12 DIAGNOSIS — E669 Obesity, unspecified: Secondary | ICD-10-CM

## 2015-08-12 DIAGNOSIS — E785 Hyperlipidemia, unspecified: Secondary | ICD-10-CM | POA: Diagnosis not present

## 2015-08-12 DIAGNOSIS — I10 Essential (primary) hypertension: Secondary | ICD-10-CM | POA: Diagnosis not present

## 2015-08-12 DIAGNOSIS — D696 Thrombocytopenia, unspecified: Secondary | ICD-10-CM | POA: Insufficient documentation

## 2015-08-12 DIAGNOSIS — Z23 Encounter for immunization: Secondary | ICD-10-CM | POA: Diagnosis not present

## 2015-08-12 HISTORY — DX: Thrombocytopenia, unspecified: D69.6

## 2015-08-12 LAB — URINALYSIS, ROUTINE W REFLEX MICROSCOPIC
Bilirubin Urine: NEGATIVE
Hgb urine dipstick: NEGATIVE
Ketones, ur: NEGATIVE
Leukocytes, UA: NEGATIVE
Nitrite: NEGATIVE
PH: 6.5 (ref 5.0–8.0)
SPECIFIC GRAVITY, URINE: 1.015 (ref 1.000–1.030)
TOTAL PROTEIN, URINE-UPE24: NEGATIVE
URINE GLUCOSE: NEGATIVE
Urobilinogen, UA: 0.2 (ref 0.0–1.0)
WBC, UA: NONE SEEN (ref 0–?)

## 2015-08-12 LAB — HEPATIC FUNCTION PANEL
ALT: 35 U/L (ref 0–35)
AST: 33 U/L (ref 0–37)
Albumin: 4 g/dL (ref 3.5–5.2)
Alkaline Phosphatase: 49 U/L (ref 39–117)
Bilirubin, Direct: 0.1 mg/dL (ref 0.0–0.3)
TOTAL PROTEIN: 7.1 g/dL (ref 6.0–8.3)
Total Bilirubin: 0.6 mg/dL (ref 0.2–1.2)

## 2015-08-12 LAB — TSH: TSH: 1.47 u[IU]/mL (ref 0.35–4.50)

## 2015-08-12 LAB — CBC WITH DIFFERENTIAL/PLATELET
BASOS PCT: 0.9 % (ref 0.0–3.0)
Basophils Absolute: 0.1 10*3/uL (ref 0.0–0.1)
EOS PCT: 9.8 % — AB (ref 0.0–5.0)
Eosinophils Absolute: 0.6 10*3/uL (ref 0.0–0.7)
HCT: 43.8 % (ref 36.0–46.0)
HEMOGLOBIN: 14.4 g/dL (ref 12.0–15.0)
LYMPHS ABS: 2 10*3/uL (ref 0.7–4.0)
Lymphocytes Relative: 35.6 % (ref 12.0–46.0)
MCHC: 32.9 g/dL (ref 30.0–36.0)
MCV: 84 fl (ref 78.0–100.0)
MONO ABS: 0.5 10*3/uL (ref 0.1–1.0)
Monocytes Relative: 9.5 % (ref 3.0–12.0)
NEUTROS PCT: 44.2 % (ref 43.0–77.0)
Neutro Abs: 2.5 10*3/uL (ref 1.4–7.7)
Platelets: 119 10*3/uL — ABNORMAL LOW (ref 150.0–400.0)
RBC: 5.22 Mil/uL — ABNORMAL HIGH (ref 3.87–5.11)
RDW: 15.9 % — AB (ref 11.5–15.5)
WBC: 5.7 10*3/uL (ref 4.0–10.5)

## 2015-08-12 LAB — BASIC METABOLIC PANEL
BUN: 18 mg/dL (ref 6–23)
CHLORIDE: 104 meq/L (ref 96–112)
CO2: 30 meq/L (ref 19–32)
CREATININE: 0.95 mg/dL (ref 0.40–1.20)
Calcium: 9.4 mg/dL (ref 8.4–10.5)
GFR: 75.55 mL/min (ref >60.00)
Glucose, Bld: 95 mg/dL (ref 70–99)
Potassium: 4.4 mEq/L (ref 3.5–5.1)
Sodium: 140 mEq/L (ref 135–145)

## 2015-08-12 LAB — LIPID PANEL
CHOL/HDL RATIO: 2
Cholesterol: 147 mg/dL (ref 0–200)
HDL: 69.9 mg/dL (ref >39.00)
LDL Cholesterol: 60 mg/dL (ref 0–99)
NonHDL: 77.51
TRIGLYCERIDES: 90 mg/dL (ref 0.0–149.0)
VLDL: 18 mg/dL (ref 0.0–40.0)

## 2015-08-12 MED ORDER — AMLODIPINE BESYLATE 10 MG PO TABS: 10.0000 mg | ORAL_TABLET | Freq: Every day | ORAL | Status: DC

## 2015-08-12 MED ORDER — PHENTERMINE HCL 37.5 MG PO TABS: 37.5000 mg | ORAL_TABLET | Freq: Every day | ORAL | Status: DC

## 2015-08-12 MED ORDER — ATORVASTATIN CALCIUM 20 MG PO TABS: 20.0000 mg | ORAL_TABLET | Freq: Every day | ORAL | Status: DC

## 2015-08-12 NOTE — Progress Notes (Signed)
Subjective:    Patient ID: Bethany Lopez, female    DOB: 08/07/1950, 65 y.o.   MRN: [id number]  HPI  Here for wellness and f/u;  Overall doing ok;  Pt denies Chest pain, worsening SOB, DOE, wheezing, orthopnea, PND, worsening LE edema, palpitations, dizziness or syncope.  Pt denies neurological change such as new headache, facial or extremity weakness.  Pt denies polydipsia, polyuria, or low sugar symptoms. Pt states overall good compliance with treatment and medications, good tolerability, and has been trying to follow appropriate diet.  Pt denies worsening depressive symptoms, suicidal ideation or panic. No fever, night sweats, wt loss, loss of appetite, or other constitutional symptoms.  Pt states good ability with ADL's, has low fall risk, home safety reviewed and adequate, no other significant changes in hearing or vision, and only occasionally active with exercise.  Has cont'd to gain wt, asks for diet med for wt loss.. Wt Readings from Last 3 Encounters:  08/12/15 245 lb (111.131 kg)  05/30/15 240 lb (108.863 kg)  02/11/15 243 lb (110.224 kg)   Past Medical History  Diagnosis Date  . Hypertension   . Obesity   . Tobacco abuse   . Hyperlipidemia 05/30/2011    NEC/NOS    . Asthma   . Allergic rhinitis, cause unspecified 05/01/2012  . Carpal tunnel syndrome 06/17/2014  . GERD (gastroesophageal reflux disease) 02/11/2015  . Thrombocytopenia ([place]) 08/12/2015   Past Surgical History  Procedure Laterality Date  . Arthroscopic right knee      5-6 yrs ago  . Tubal ligation      reports that she has been smoking Cigarettes.  She has been smoking about 0.50 packs per day. She has never used smokeless tobacco. She reports that she drinks alcohol. She reports that she does not use illicit drugs. family history includes Diabetes in her brother, mother, and sister; Heart disease in her father; Kidney disease in her mother. Allergies  Allergen Reactions  . Lodine [Etodolac] Other (See  Comments)    GI upset   Current Outpatient Prescriptions on File Prior to Visit  Medication Sig Dispense Refill  . buPROPion (WELLBUTRIN XL) 150 MG 24 hr tablet Take 1 tablet (150 mg total) by mouth daily. 30 tablet 3   No current facility-administered medications on file prior to visit.   Review of Systems Constitutional: Negative for increased diaphoresis, or other activity, appetite or siginficant weight change other than noted HENT: Negative for worsening hearing loss, ear pain, facial swelling, mouth sores and neck stiffness.   Eyes: Negative for other worsening pain, redness or visual disturbance.  Respiratory: Negative for choking or stridor Cardiovascular: Negative for other chest pain and palpitations.  Gastrointestinal: Negative for worsening diarrhea, blood in stool, or abdominal distention Genitourinary: Negative for hematuria, flank pain or change in urine volume.  Musculoskeletal: Negative for myalgias or other joint complaints.  Skin: Negative for other color change and wound or drainage.  Neurological: Negative for syncope and numbness. other than noted Hematological: Negative for adenopathy. or other swelling Psychiatric/Behavioral: Negative for hallucinations, SI, self-injury, decreased concentration or other worsening agitation.      Objective:   Physical Exam BP 136/80 mmHg  Pulse 85  Temp(Src) 98.2 F (36.8 C) (Oral)  Resp 20  Wt 245 lb (111.131 kg)  SpO2 98% VS noted,  Constitutional: Pt is oriented to person, place, and time. Appears well-developed and well-nourished, in no significant distress Head: Normocephalic and atraumatic  Eyes: Conjunctivae and EOM are normal. Pupils are  equal, round, and reactive to light Right Ear: External ear normal.  Left Ear: External ear normal Nose: Nose normal.  Mouth/Throat: Oropharynx is clear and moist  Neck: Normal range of motion. Neck supple. No JVD present. No tracheal deviation present or significant neck LA or  mass Cardiovascular: Normal rate, regular rhythm, normal heart sounds and intact distal pulses.   Pulmonary/Chest: Effort normal and breath sounds without rales or wheezing  Abdominal: Soft. Bowel sounds are normal. NT. No HSM  Musculoskeletal: Normal range of motion. Exhibits no edema Lymphadenopathy: Has no cervical adenopathy.  Neurological: Pt is alert and oriented to person, place, and time. Pt has normal reflexes. No cranial nerve deficit. Motor grossly intact Skin: Skin is warm and dry. No rash noted or new ulcers Psychiatric:  Has normal mood and affect. Behavior is normal.   Most recent echo nov 2016 Study Result    Result status: Edited Result - FINAL     [facility]  [address],  [address]  [phone]  ------------------------------------------------------------------- Transthoracic Echocardiography  (Report amended )  Patient: Bethany, Lopez MR #: [id number] Study Date: 02/25/2015 Gender: F Age: 4 Height: 167.6 cm Weight: 110.2 kg BSA: 2.32 m^2 Pt. Status: Room:  ATTENDING [facility] REFERRING Biagio Borg SONOGRAPHER Marygrace Drought, [facility] PERFORMING [facility], Outpatient  cc:  ------------------------------------------------------------------- LV EF: 55% - 60%  ------------------------------------------------------------------- Indications: Leg Swelling (M79.89).  ------------------------------------------------------------------- History: Risk factors: Hypertension.  ------------------------------------------------------------------- Study Conclusions  - Left ventricle: The cavity size was normal. There was moderate  concentric hypertrophy. Systolic function was normal. The  estimated ejection fraction was in the range  of 55% to 60%. Wall  motion was normal; there were no regional wall motion  abnormalities. Doppler parameters are consistent with abnormal  left ventricular relaxation (grade 1 diastolic dysfunction). - Mitral valve: Mild prolapse, involving the anterior leaflet.  Transthoracic echocardiography. M-mode, complete 2D, spectral Doppler, and color Doppler. Birthdate: Patient birthdate: 10-14-1950. Age: Patient is 65 yr old. Sex: Gender: female. BMI: 39.2 kg/m^2. Blood pressure: 138/86 Patient status: Outpatient. Study date: Study date: 02/25/2015. Study time: 10:22 AM. Location: Echo laboratory.   Lab Results  Component Value Date   WBC 5.0 02/11/2015   HGB 14.5 02/11/2015   HCT 44.5 02/11/2015   PLT 114.0* 02/11/2015   GLUCOSE 93 02/11/2015   CHOL 150 02/11/2015   TRIG 94.0 02/11/2015   HDL 58.80 02/11/2015   LDLDIRECT 143.9 08/02/2011   LDLCALC 73 02/11/2015   ALT 31 02/11/2015   AST 30 02/11/2015   NA 141 02/11/2015   K 3.9 02/11/2015   CL 105 02/11/2015   CREATININE 0.90 02/11/2015   BUN 15 02/11/2015   CO2 27 02/11/2015   TSH 1.21 02/11/2015   INR 1.1 11/12/2012       Assessment & Plan:

## 2015-08-12 NOTE — Progress Notes (Signed)
Pre visit review using our clinic review tool, if applicable. No additional management support is needed unless otherwise documented below in the visit note. 

## 2015-08-12 NOTE — Patient Instructions (Addendum)
You had the Pneumovax pneumonia shot today  Please take all new medication as prescribed - the diet pill  Please continue all other medications as before, and refills have been done if requested.  Please have the pharmacy call with any other refills you may need.  Please continue your efforts at being more active, low cholesterol diet, and weight control.  You are otherwise up to date with prevention measures today.  Please keep your appointments with your specialists as you may have planned  Please go to the LAB in the Basement (turn left off the elevator) for the tests to be done today  You will be contacted by phone if any changes need to be made immediately.  Otherwise, you will receive a letter about your results with an explanation, but please check with MyChart first.  Please remember to sign up for MyChart if you have not done so, as this will be important to you in the future with finding out test results, communicating by private email, and scheduling acute appointments online when needed.  Please return in 1 year for your yearly visit, or sooner if needed, with Lab testing done 3-5 days before

## 2015-08-15 NOTE — Assessment & Plan Note (Signed)
stable overall by history and exam, recent data reviewed with pt, and pt to continue medical treatment as before,  to f/u any worsening symptoms or concerns BP Readings from Last 3 Encounters:  08/12/15 136/80  05/30/15 179/93  02/11/15 138/86

## 2015-08-15 NOTE — Assessment & Plan Note (Signed)
stable overall by history and exam, recent data reviewed with pt, and pt to continue medical treatment as before,  to f/u any worsening symptoms or concerns Lab Results  Component Value Date   LDLCALC 60 08/12/2015

## 2015-08-15 NOTE — Assessment & Plan Note (Signed)

## 2015-08-15 NOTE — Assessment & Plan Note (Addendum)
[place] for phentermine asd,  to f/u any worsening symptoms or concerns, for lower calorie diet, increased activity  In addition to the time spent performing CPE, I spent an additional 15 minutes face to face,in which greater than 50% of this time was spent in counseling and coordination of care for patient's illness as documented.

## 2015-11-10 ENCOUNTER — Telehealth: Payer: Self-pay | Admitting: Emergency Medicine

## 2015-11-10 NOTE — Telephone Encounter (Signed)
Pt needs a refill on triamcinolone cream (KENALOG) 0.1%. Pharmacy is [facility], [place]. Please follow up thanks.

## 2015-11-11 ENCOUNTER — Other Ambulatory Visit: Payer: Self-pay | Admitting: Internal Medicine

## 2015-11-12 MED ORDER — TRIAMCINOLONE ACETONIDE 0.1 % EX CREA
TOPICAL_CREAM | CUTANEOUS | 5 refills | Status: DC
Start: 2015-11-12 — End: 2016-08-10

## 2015-11-12 NOTE — Telephone Encounter (Signed)
This has been done erx

## 2015-11-19 ENCOUNTER — Other Ambulatory Visit: Payer: Self-pay | Admitting: Emergency Medicine

## 2016-02-10 ENCOUNTER — Ambulatory Visit (INDEPENDENT_AMBULATORY_CARE_PROVIDER_SITE_OTHER): Payer: BLUE CROSS/BLUE SHIELD | Admitting: Internal Medicine

## 2016-02-10 VITALS — BP 130/78 | HR 94 | Temp 98.3°F | Ht 66.0 in | Wt 242.0 lb

## 2016-02-10 DIAGNOSIS — J45909 Unspecified asthma, uncomplicated: Secondary | ICD-10-CM | POA: Insufficient documentation

## 2016-02-10 DIAGNOSIS — J4521 Mild intermittent asthma with (acute) exacerbation: Secondary | ICD-10-CM | POA: Diagnosis not present

## 2016-02-10 DIAGNOSIS — J309 Allergic rhinitis, unspecified: Secondary | ICD-10-CM | POA: Diagnosis not present

## 2016-02-10 DIAGNOSIS — R05 Cough: Secondary | ICD-10-CM | POA: Diagnosis not present

## 2016-02-10 DIAGNOSIS — I10 Essential (primary) hypertension: Secondary | ICD-10-CM

## 2016-02-10 DIAGNOSIS — Z23 Encounter for immunization: Secondary | ICD-10-CM | POA: Diagnosis not present

## 2016-02-10 DIAGNOSIS — J45901 Unspecified asthma with (acute) exacerbation: Secondary | ICD-10-CM | POA: Insufficient documentation

## 2016-02-10 DIAGNOSIS — R059 Cough, unspecified: Secondary | ICD-10-CM

## 2016-02-10 MED ORDER — METHYLPREDNISOLONE ACETATE 80 MG/ML IJ SUSP
80.0000 mg | Freq: Once | INTRAMUSCULAR | Status: AC
  Administered 2016-02-10: 80 mg via INTRAMUSCULAR

## 2016-02-10 MED ORDER — TRIAMCINOLONE ACETONIDE 55 MCG/ACT NA AERO
2.0000 | INHALATION_SPRAY | Freq: Every day | NASAL | 12 refills | Status: DC
Start: 2016-02-10 — End: 2016-08-10

## 2016-02-10 MED ORDER — ALBUTEROL SULFATE HFA 108 (90 BASE) MCG/ACT IN AERS: 2.0000 | INHALATION_SPRAY | Freq: Four times a day (QID) | RESPIRATORY_TRACT | 2 refills | Status: DC | PRN

## 2016-02-10 MED ORDER — PREDNISONE 10 MG PO TABS: ORAL_TABLET | ORAL | 0 refills | Status: DC

## 2016-02-10 MED ORDER — CETIRIZINE HCL 10 MG PO TABS: 10.0000 mg | ORAL_TABLET | Freq: Every day | ORAL | 11 refills | Status: DC

## 2016-02-10 NOTE — Assessment & Plan Note (Signed)
Mild to mod seasonal flare, for depomedrol IM, then zyrtec, nasacort asd, to f/u any worsening symptoms or concerns

## 2016-02-10 NOTE — Progress Notes (Deleted)
Subjective:     Patient ID: Bethany Lopez, female   DOB: June 29, 1950, 65 y.o.   MRN: [id number]  HPI    2 wks hoarseness, no fever, did not feel bad, no missed work, lots fo cough with production  Due for flu shot Review of Systems     Objective:   Physical Exam     Assessment:     ***    Plan:     ***

## 2016-02-10 NOTE — Assessment & Plan Note (Signed)
Mild, for steroid tx above, also Proair MDI prn asd,,  to f/u any worsening symptoms or concerns

## 2016-02-10 NOTE — Patient Instructions (Signed)
You had the flu shot and the steroid shot today  Please take all new medication as prescribed - the zyrtec and nasacort  Please continue all other medications as before, and refills have been done if requested - the inhaler  Please have the pharmacy call with any other refills you may need.  Please keep your appointments with your specialists as you may have planned  Please return in 6 months, or sooner if needed

## 2016-02-10 NOTE — Progress Notes (Signed)
Subjective:    Patient ID: Bethany Lopez, female    DOB: Jan 01, 1951, 65 y.o.   MRN: [id number]  HPI   Here with 2 wks hoarseness, no fever, did not feel bad, no missed work, lots of cough with some whitish sputum production; Does have several wks ongoing nasal allergy symptoms with clearish congestion, itch and sneezing, without fever, pain, ST, cough, swelling but has had mild wheezing and sob for 2 days..  Pt denies fever, wt loss, night sweats, loss of appetite, or other constitutional symptoms  Due for flu shot.  Pt denies chest pain, orthopnea, PND, increased LE swelling, palpitations, dizziness or syncope.   Past Medical History:  Diagnosis Date  . Allergic rhinitis, cause unspecified 05/01/2012  . Asthma   . Carpal tunnel syndrome 06/17/2014  . GERD (gastroesophageal reflux disease) 02/11/2015  . Hyperlipidemia 05/30/2011   NEC/NOS    . Hypertension   . Obesity   . Thrombocytopenia ([place]) 08/12/2015  . Tobacco abuse    Past Surgical History:  Procedure Laterality Date  . arthroscopic right knee     5-6 yrs ago  . TUBAL LIGATION      reports that she has been smoking Cigarettes.  She has been smoking about 0.50 packs per day. She has never used smokeless tobacco. She reports that she drinks alcohol. She reports that she does not use drugs. family history includes Diabetes in her brother, mother, and sister; Heart disease in her father; Kidney disease in her mother. Allergies  Allergen Reactions  . Lodine [Etodolac] Other (See Comments)    GI upset   Current Outpatient Prescriptions on File Prior to Visit  Medication Sig Dispense Refill  . amLODipine (NORVASC) 10 MG tablet Take 1 tablet (10 mg total) by mouth daily. 90 tablet 3  . atorvastatin (LIPITOR) 20 MG tablet Take 1 tablet (20 mg total) by mouth daily. 90 tablet 3  . phentermine (ADIPEX-P) 37.5 MG tablet Take 1 tablet (37.5 mg total) by mouth daily before breakfast. 30 tablet 2  . triamcinolone cream (KENALOG)  0.1 % APPLY  CREAM EXTERNALLY TO AFFECTED AREA TWICE DAILY AS NEEDED 80 g 5  . buPROPion (WELLBUTRIN XL) 150 MG 24 hr tablet Take 1 tablet (150 mg total) by mouth daily. 30 tablet 3   No current facility-administered medications on file prior to visit.     Review of Systems  Constitutional: Negative for unusual diaphoresis or night sweats HENT: Negative for ear swelling or discharge Eyes: Negative for worsening visual haziness  Respiratory: Negative for choking and stridor.   Gastrointestinal: Negative for distension or worsening eructation Genitourinary: Negative for retention or change in urine volume.  Musculoskeletal: Negative for other MSK pain or swelling Skin: Negative for color change and worsening wound Neurological: Negative for tremors and numbness other than noted  Psychiatric/Behavioral: Negative for decreased concentration or agitation other than above   All o/w neg per pt    Objective:   Physical Exam BP 130/78   Pulse 94   Temp 98.3 F (36.8 C) (Oral)   Ht 5\' 6"  (1.676 m)   Wt 242 lb (109.8 kg)   SpO2 95%   BMI 39.06 kg/m  VS noted,  Constitutional: Pt appears in no apparent distress HENT: Head: NCAT.  Right Ear: External ear normal.  Left Ear: External ear normal.  Bilat tm's with mild erythema.  Max sinus areas non tender.  Pharynx with mild erythema, no exudate Eyes: . Pupils are equal, round, and reactive to  light. Conjunctivae and EOM are normal Neck: Normal range of motion. Neck supple.  Cardiovascular: Normal rate and regular rhythm.   Pulmonary/Chest: Effort normal and breath sounds without rales but with few bilat scattered wheezing.  Abd:  Soft, NT, ND, + BS Neurological: Pt is alert. Not confused , motor grossly intact Skin: Skin is warm. No rash, no LE edema Psychiatric: Pt behavior is normal. No agitation.      Assessment & Plan:

## 2016-02-10 NOTE — Progress Notes (Signed)
Pre visit review using our clinic review tool, if applicable. No additional management support is needed unless otherwise documented below in the visit note. 

## 2016-02-10 NOTE — Assessment & Plan Note (Signed)
stable overall by history and exam, recent data reviewed with pt, and pt to continue medical treatment as before,  to f/u any worsening symptoms or concerns BP Readings from Last 3 Encounters:  02/10/16 130/78  08/12/15 136/80  05/30/15 (!) 179/93

## 2016-03-03 ENCOUNTER — Encounter: Payer: Self-pay | Admitting: Emergency Medicine

## 2016-03-03 ENCOUNTER — Emergency Department
Admission: EM | Admit: 2016-03-03 | Discharge: 2016-03-03 | Disposition: A | Discharge status: Home / Self Care | Payer: BLUE CROSS/BLUE SHIELD | Attending: Emergency Medicine | Admitting: Emergency Medicine

## 2016-03-03 DIAGNOSIS — X58XXXA Exposure to other specified factors, initial encounter: Secondary | ICD-10-CM | POA: Diagnosis not present

## 2016-03-03 DIAGNOSIS — Y929 Unspecified place or not applicable: Secondary | ICD-10-CM | POA: Insufficient documentation

## 2016-03-03 DIAGNOSIS — Y9389 Activity, other specified: Secondary | ICD-10-CM | POA: Diagnosis not present

## 2016-03-03 DIAGNOSIS — F17210 Nicotine dependence, cigarettes, uncomplicated: Secondary | ICD-10-CM | POA: Diagnosis not present

## 2016-03-03 DIAGNOSIS — Z79899 Other long term (current) drug therapy: Secondary | ICD-10-CM | POA: Insufficient documentation

## 2016-03-03 DIAGNOSIS — J45909 Unspecified asthma, uncomplicated: Secondary | ICD-10-CM | POA: Diagnosis not present

## 2016-03-03 DIAGNOSIS — S39012A Strain of muscle, fascia and tendon of lower back, initial encounter: Secondary | ICD-10-CM | POA: Insufficient documentation

## 2016-03-03 DIAGNOSIS — S3992XA Unspecified injury of lower back, initial encounter: Secondary | ICD-10-CM | POA: Diagnosis present

## 2016-03-03 DIAGNOSIS — I10 Essential (primary) hypertension: Secondary | ICD-10-CM | POA: Insufficient documentation

## 2016-03-03 DIAGNOSIS — Y990 Civilian activity done for income or pay: Secondary | ICD-10-CM | POA: Diagnosis not present

## 2016-03-03 DIAGNOSIS — T148XXA Other injury of unspecified body region, initial encounter: Secondary | ICD-10-CM

## 2016-03-03 MED ORDER — CYCLOBENZAPRINE HCL 10 MG PO TABS: 10.0000 mg | ORAL_TABLET | Freq: Three times a day (TID) | ORAL | 0 refills | Status: DC | PRN

## 2016-03-03 MED ORDER — TRAMADOL HCL 50 MG PO TABS: 50.0000 mg | ORAL_TABLET | Freq: Four times a day (QID) | ORAL | 0 refills | Status: DC | PRN

## 2016-03-03 MED ORDER — ORPHENADRINE CITRATE 30 MG/ML IJ SOLN
60.0000 mg | Freq: Two times a day (BID) | INTRAMUSCULAR | Status: DC
  Administered 2016-03-03: 60 mg via INTRAMUSCULAR
  Filled 2016-03-03: qty 2

## 2016-03-03 MED ORDER — HYDROMORPHONE HCL 1 MG/ML IJ SOLN
1.0000 mg | Freq: Once | INTRAMUSCULAR | Status: AC
  Administered 2016-03-03: 1 mg via INTRAMUSCULAR
  Filled 2016-03-03: qty 1

## 2016-03-03 NOTE — ED Provider Notes (Signed)
[facility] Emergency Department Provider Note   ____________________________________________   First MD Initiated Contact with Patient 03/03/16 [phone]     (approximate)  I have reviewed the triage vital signs and the nursing notes.   HISTORY  Chief Complaint Back Pain    HPI Bethany Lopez is a 65 y.o. female patient complaining of right lateral back pain for 3 days. Patient denies any urinary complaints. Patient denies any nausea vomiting diarrhea. Patient state pain onset after trying (a newly installed door at work. Patient denies any radicular component to her pain. Patient state the only other symptom that she associated with this complaint is decreased appetite. Patient state in the last 2 days she has not filled like eating. Patient rates the pain as a 10 over 10. Patient described the pain as "crampy and sharp". No palliative measures for her complaint.  Past Medical History:  Diagnosis Date  . Allergic rhinitis, cause unspecified 05/01/2012  . Asthma   . Carpal tunnel syndrome 06/17/2014  . GERD (gastroesophageal reflux disease) 02/11/2015  . Hyperlipidemia 05/30/2011   NEC/NOS    . Hypertension   . Obesity   . Thrombocytopenia ([place]) 08/12/2015  . Tobacco abuse     Patient Active Problem List   Diagnosis Date Noted  . Asthma 02/10/2016  . Asthma with acute exacerbation 02/10/2016  . Thrombocytopenia ([place]) 08/12/2015  . GERD (gastroesophageal reflux disease) 02/11/2015  . Leg swelling 02/11/2015  . Carpal tunnel syndrome 06/17/2014  . Pain and swelling of toe of left foot 06/17/2014  . Acute upper respiratory infections of unspecified site 06/18/2013  . Thrombocytopenia, unspecified 08/07/2012  . Heme positive stool 05/02/2012  . Fatigue 05/02/2012  . Allergic rhinitis 05/01/2012  . Obesity 05/01/2012  . Anemia, unspecified 04/30/2012  . Encounter for preventative adult health care exam with abnormal findings 12/01/2011  .  Tobacco use 08/02/2011  . Hypertension 08/02/2011  . Dermatitis 08/02/2011  . Hyperlipidemia 05/30/2011    Past Surgical History:  Procedure Laterality Date  . arthroscopic right knee     5-6 yrs ago  . TUBAL LIGATION      Prior to Admission medications   Medication Sig Start Date End Date Taking? Authorizing Provider  albuterol (PROVENTIL HFA;VENTOLIN HFA) 108 (90 Base) MCG/ACT inhaler Inhale 2 puffs into the lungs every 6 (six) hours as needed for wheezing or shortness of breath. 02/10/16   Biagio Borg, MD  amLODipine (NORVASC) 10 MG tablet Take 1 tablet (10 mg total) by mouth daily. 08/12/15   Biagio Borg, MD  atorvastatin (LIPITOR) 20 MG tablet Take 1 tablet (20 mg total) by mouth daily. 08/12/15   Biagio Borg, MD  buPROPion (WELLBUTRIN XL) 150 MG 24 hr tablet Take 1 tablet (150 mg total) by mouth daily. 08/02/11 08/01/12  Jackolyn Confer, MD  cetirizine (ZYRTEC) 10 MG tablet Take 1 tablet (10 mg total) by mouth daily. 02/10/16 02/09/17  Biagio Borg, MD  cyclobenzaprine (FLEXERIL) 10 MG tablet Take 1 tablet (10 mg total) by mouth 3 (three) times daily as needed. 03/03/16   Sable Feil, PA-C  phentermine (ADIPEX-P) 37.5 MG tablet Take 1 tablet (37.5 mg total) by mouth daily before breakfast. 08/12/15   Biagio Borg, MD  predniSONE (DELTASONE) 10 MG tablet 3 tabs by mouth per day for 3 days,2tabs per day for 3 days,1tab per day for 3 days 02/10/16   Biagio Borg, MD  traMADol (ULTRAM) 50 MG tablet Take 1 tablet (  50 mg total) by mouth every 6 (six) hours as needed for moderate pain. 03/03/16   Sable Feil, PA-C  triamcinolone (NASACORT AQ) 55 MCG/ACT AERO nasal inhaler Place 2 sprays into the nose daily. 02/10/16   Biagio Borg, MD  triamcinolone cream (KENALOG) 0.1 % APPLY  CREAM EXTERNALLY TO AFFECTED AREA TWICE DAILY AS NEEDED 11/12/15   Biagio Borg, MD    Allergies   Family History  Problem Relation Age of Onset  . Diabetes Mother   . Kidney disease Mother     renal  failure  . Heart disease Father     congestive heart failure  . Diabetes Sister   . Diabetes Brother     Social History Social History  Substance Use Topics  . Smoking status: Current Every Day Smoker    Packs/day: 0.50    Types: Cigarettes  . Smokeless tobacco: Never Used     Comment: smokes 1 pack every two days  . Alcohol use Yes     Comment: occasionally has wine    Review of Systems Constitutional: No fever/chills Eyes: No visual changes. ENT: No sore throat. Cardiovascular: Denies chest pain. Respiratory: Denies shortness of breath. Gastrointestinal: No abdominal pain.  No nausea, no vomiting.  No diarrhea.  No constipation. Genitourinary: Negative for dysuria. Musculoskeletal: Negative for back pain. Skin: Negative for rash. Neurological: Negative for headaches, focal weakness or numbness. Endocrine:Hypertension and hyperlipidemia. ____________________________________________   PHYSICAL EXAM:  VITAL SIGNS: ED Triage Vitals  Enc Vitals Group     BP 03/03/16 0707 (!) 147/85     Pulse Rate 03/03/16 0707 (!) 101     Resp 03/03/16 0707 18     Temp 03/03/16 0707 98.1 F (36.7 C)     Temp Source 03/03/16 0707 Oral     SpO2 03/03/16 0707 98 %     Weight 03/03/16 0707 242 lb (109.8 kg)     Height 03/03/16 0707 5\' 6"  (1.676 m)     Head Circumference --      Peak Flow --      Pain Score 03/03/16 0712 10     Pain Loc --      Pain Edu? --      Excl. in [place]? --     Constitutional: Alert and oriented. Well appearing and in no acute distress.Obesity Eyes: Conjunctivae are normal. PERRL. EOMI. Head: Atraumatic. Nose: No congestion/rhinnorhea. Mouth/Throat: Mucous membranes are moist.  Oropharynx non-erythematous. Neck: No stridor.  No cervical spine tenderness to palpation. Hematological/Lymphatic/Immunilogical: No cervical lymphadenopathy. Cardiovascular: Normal rate, regular rhythm. Grossly normal heart sounds.  Good peripheral circulation. Respiratory: Normal  respiratory effort.  No retractions. Lungs CTAB. Gastrointestinal: Soft and nontender. No distention. No abdominal bruits. No CVA tenderness. Musculoskeletal: No obvious spinal deformity. No guarding with palpation spinal processes. Patient decreased range of motion with left lateral movements. Right paraspinal muscle spasm elicited with lateral movements. Patient is Straight leg test.  Neurologic:  Normal speech and language. No gross focal neurologic deficits are appreciated. No gait instability. Skin:  Skin is warm, dry and intact. No rash noted. Psychiatric: Mood and affect are normal. Speech and behavior are normal.  ____________________________________________   LABS (all labs ordered are listed, but only abnormal results are displayed)  Labs Reviewed - No data to display ____________________________________________  EKG   ____________________________________________  RADIOLOGY   ____________________________________________   PROCEDURES  Procedure(s) performed: None  Procedures  Critical Care performed: No  ____________________________________________   INITIAL IMPRESSION / ASSESSMENT AND  PLAN / ED COURSE  Pertinent labs & imaging results that were available during my care of the patient were reviewed by me and considered in my medical decision making (see chart for details).  Mid right lumbar strain. Patient given discharge care instructions. Patient given prescription for tramadol and Flexeril. Patient a work note. Patient advised follow-up family doctor if condition persists and to discuss a lack of appetite.  Clinical Course    Patient was initially tachycardic upon discharge heart rate was 88.  ____________________________________________   FINAL CLINICAL IMPRESSION(S) / ED DIAGNOSES  Final diagnoses:  Muscle strain      NEW MEDICATIONS STARTED DURING THIS VISIT:  New Prescriptions   CYCLOBENZAPRINE (FLEXERIL) 10 MG TABLET    Take 1 tablet (10  mg total) by mouth 3 (three) times daily as needed.   TRAMADOL (ULTRAM) 50 MG TABLET    Take 1 tablet (50 mg total) by mouth every 6 (six) hours as needed for moderate pain.     Note:  This document was prepared using Dragon voice recognition software and may include unintentional dictation errors.    Sable Feil, PA-C 03/03/16 0809    Sable Feil, PA-C 03/03/16 1015    Delman Kitten, MD 03/03/16 1021

## 2016-03-03 NOTE — ED Notes (Signed)
See triage note  Right side mid back pain since Tuesday  Intermittent fever states pain radiates across lower back but not into legs

## 2016-03-03 NOTE — ED Triage Notes (Signed)
Pt reports low to mid back pain on the right side. Pt denies nausea, vomiting and diarrhea. Pt denies dysuria. Pt reports no other symptoms other than decreased appetite. Pt in no apparent distress in triage.

## 2016-04-14 ENCOUNTER — Telehealth: Payer: Self-pay | Admitting: Internal Medicine

## 2016-04-14 DIAGNOSIS — J3489 Other specified disorders of nose and nasal sinuses: Secondary | ICD-10-CM

## 2016-04-14 NOTE — Telephone Encounter (Signed)
Pt called in and said that she needs an ENT doctor in [place].  She would like a referral.  She said that she is not feeling any better and she wants to go to a specialist

## 2016-04-14 NOTE — Telephone Encounter (Signed)
Referral done

## 2016-08-10 ENCOUNTER — Encounter: Payer: Self-pay | Admitting: Internal Medicine

## 2016-08-10 ENCOUNTER — Other Ambulatory Visit (INDEPENDENT_AMBULATORY_CARE_PROVIDER_SITE_OTHER): Payer: BLUE CROSS/BLUE SHIELD

## 2016-08-10 ENCOUNTER — Ambulatory Visit (INDEPENDENT_AMBULATORY_CARE_PROVIDER_SITE_OTHER)
Admission: RE | Admit: 2016-08-10 | Discharge: 2016-08-10 | Disposition: A | Discharge status: Home / Self Care | Payer: BLUE CROSS/BLUE SHIELD | Source: Ambulatory Visit | Attending: Internal Medicine | Admitting: Internal Medicine

## 2016-08-10 ENCOUNTER — Ambulatory Visit (INDEPENDENT_AMBULATORY_CARE_PROVIDER_SITE_OTHER): Payer: BLUE CROSS/BLUE SHIELD | Admitting: Internal Medicine

## 2016-08-10 VITALS — BP 146/84 | HR 74 | Ht 65.0 in | Wt 240.0 lb

## 2016-08-10 DIAGNOSIS — M5432 Sciatica, left side: Secondary | ICD-10-CM

## 2016-08-10 DIAGNOSIS — F32A Depression, unspecified: Secondary | ICD-10-CM | POA: Insufficient documentation

## 2016-08-10 DIAGNOSIS — F329 Major depressive disorder, single episode, unspecified: Secondary | ICD-10-CM | POA: Diagnosis not present

## 2016-08-10 DIAGNOSIS — E2839 Other primary ovarian failure: Secondary | ICD-10-CM

## 2016-08-10 DIAGNOSIS — E669 Obesity, unspecified: Secondary | ICD-10-CM | POA: Diagnosis not present

## 2016-08-10 DIAGNOSIS — I10 Essential (primary) hypertension: Secondary | ICD-10-CM

## 2016-08-10 DIAGNOSIS — Z0001 Encounter for general adult medical examination with abnormal findings: Secondary | ICD-10-CM | POA: Diagnosis not present

## 2016-08-10 DIAGNOSIS — J309 Allergic rhinitis, unspecified: Secondary | ICD-10-CM

## 2016-08-10 DIAGNOSIS — E785 Hyperlipidemia, unspecified: Secondary | ICD-10-CM | POA: Diagnosis not present

## 2016-08-10 DIAGNOSIS — Z1159 Encounter for screening for other viral diseases: Secondary | ICD-10-CM

## 2016-08-10 LAB — LIPID PANEL
CHOL/HDL RATIO: 2
Cholesterol: 169 mg/dL (ref 0–200)
HDL: 79.3 mg/dL (ref >39.00)
LDL CALC: 66 mg/dL (ref 0–99)
NonHDL: 89.92
Triglycerides: 119 mg/dL (ref 0.0–149.0)
VLDL: 23.8 mg/dL (ref 0.0–40.0)

## 2016-08-10 LAB — URINALYSIS, ROUTINE W REFLEX MICROSCOPIC
Bilirubin Urine: NEGATIVE
HGB URINE DIPSTICK: NEGATIVE
Ketones, ur: NEGATIVE
Leukocytes, UA: NEGATIVE
NITRITE: NEGATIVE
SPECIFIC GRAVITY, URINE: 1.02 (ref 1.000–1.030)
Total Protein, Urine: NEGATIVE
URINE GLUCOSE: NEGATIVE
Urobilinogen, UA: 0.2 (ref 0.0–1.0)
pH: 7 (ref 5.0–8.0)

## 2016-08-10 LAB — BASIC METABOLIC PANEL
BUN: 17 mg/dL (ref 6–23)
CHLORIDE: 105 meq/L (ref 96–112)
CO2: 30 mEq/L (ref 19–32)
CREATININE: 0.85 mg/dL (ref 0.40–1.20)
Calcium: 9.7 mg/dL (ref 8.4–10.5)
GFR: 85.63 mL/min (ref >60.00)
Glucose, Bld: 92 mg/dL (ref 70–99)
POTASSIUM: 4.1 meq/L (ref 3.5–5.1)
Sodium: 139 mEq/L (ref 135–145)

## 2016-08-10 LAB — HEPATIC FUNCTION PANEL
ALT: 31 U/L (ref 0–35)
AST: 31 U/L (ref 0–37)
Albumin: 4 g/dL (ref 3.5–5.2)
Alkaline Phosphatase: 54 U/L (ref 39–117)
BILIRUBIN DIRECT: 0.1 mg/dL (ref 0.0–0.3)
BILIRUBIN TOTAL: 0.7 mg/dL (ref 0.2–1.2)
Total Protein: 7.3 g/dL (ref 6.0–8.3)

## 2016-08-10 LAB — CBC WITH DIFFERENTIAL/PLATELET
BASOS PCT: 1.1 % (ref 0.0–3.0)
Basophils Absolute: 0.1 10*3/uL (ref 0.0–0.1)
EOS ABS: 0.4 10*3/uL (ref 0.0–0.7)
EOS PCT: 7.8 % — AB (ref 0.0–5.0)
HEMATOCRIT: 45.7 % (ref 36.0–46.0)
HEMOGLOBIN: 14.7 g/dL (ref 12.0–15.0)
LYMPHS PCT: 33.2 % (ref 12.0–46.0)
Lymphs Abs: 1.5 10*3/uL (ref 0.7–4.0)
MCHC: 32.1 g/dL (ref 30.0–36.0)
MCV: 87.2 fl (ref 78.0–100.0)
MONOS PCT: 12.5 % — AB (ref 3.0–12.0)
Monocytes Absolute: 0.6 10*3/uL (ref 0.1–1.0)
NEUTROS ABS: 2.1 10*3/uL (ref 1.4–7.7)
Neutrophils Relative %: 45.4 % (ref 43.0–77.0)
PLATELETS: 110 10*3/uL — AB (ref 150.0–400.0)
RBC: 5.24 Mil/uL — ABNORMAL HIGH (ref 3.87–5.11)
RDW: 15.5 % (ref 11.5–15.5)
WBC: 4.6 10*3/uL (ref 4.0–10.5)

## 2016-08-10 LAB — TSH: TSH: 1.45 u[IU]/mL (ref 0.35–4.50)

## 2016-08-10 LAB — HEPATITIS C ANTIBODY: HCV Ab: NEGATIVE

## 2016-08-10 MED ORDER — METHYLPREDNISOLONE ACETATE 80 MG/ML IJ SUSP
80.0000 mg | Freq: Once | INTRAMUSCULAR | Status: AC
  Administered 2016-08-10: 80 mg via INTRAMUSCULAR

## 2016-08-10 MED ORDER — TRIAMCINOLONE ACETONIDE 55 MCG/ACT NA AERO: 2.0000 | INHALATION_SPRAY | Freq: Every day | NASAL | 12 refills | Status: DC

## 2016-08-10 MED ORDER — CETIRIZINE HCL 10 MG PO TABS: 10.0000 mg | ORAL_TABLET | Freq: Every day | ORAL | 3 refills | Status: AC

## 2016-08-10 MED ORDER — ATORVASTATIN CALCIUM 20 MG PO TABS: 20.0000 mg | ORAL_TABLET | Freq: Every day | ORAL | 3 refills | Status: DC

## 2016-08-10 MED ORDER — AMLODIPINE BESYLATE 10 MG PO TABS: 10.0000 mg | ORAL_TABLET | Freq: Every day | ORAL | 3 refills | Status: DC

## 2016-08-10 MED ORDER — ALBUTEROL SULFATE HFA 108 (90 BASE) MCG/ACT IN AERS: 2.0000 | INHALATION_SPRAY | Freq: Four times a day (QID) | RESPIRATORY_TRACT | 11 refills | Status: AC | PRN

## 2016-08-10 MED ORDER — PHENTERMINE HCL 37.5 MG PO TABS: 37.5000 mg | ORAL_TABLET | Freq: Every day | ORAL | 2 refills | Status: DC

## 2016-08-10 MED ORDER — TRIAMCINOLONE ACETONIDE 0.1 % EX CREA
TOPICAL_CREAM | CUTANEOUS | 5 refills | Status: DC
Start: 2016-08-10 — End: 2018-01-28

## 2016-08-10 NOTE — Assessment & Plan Note (Signed)
Chronic stable recurrent, mild, to cont same tx  In addition to the time spent performing CPE, I spent an additional 20 minutes face to face,in which greater than 50% of this time was spent in counseling and coordination of care for patient's illness as documented., including the differential diagnosis, evaluation and treatment of left sciatica, HTn, depression, obesity and allergic rhinitis

## 2016-08-10 NOTE — Assessment & Plan Note (Signed)
Stable off the wellbutrin,  to f/u any worsening symptoms or concerns

## 2016-08-10 NOTE — Assessment & Plan Note (Signed)

## 2016-08-10 NOTE — Patient Instructions (Addendum)
You had the steroid shot today  Please continue all other medications as before, and refills have been done if requested - the phentermine  Please schedule the bone density test before leaving today at the scheduling desk (where you check out)  Please have the pharmacy call with any other refills you may need.  Please continue your efforts at being more active, low cholesterol diet, and weight control.  You will be contacted regarding the referral for: GYN in [place] are otherwise up to date with prevention measures today.  Please keep your appointments with your specialists as you may have planned  Please go to the LAB in the Basement (turn left off the elevator) for the tests to be done today  You will be contacted by phone if any changes need to be made immediately.  Otherwise, you will receive a letter about your results with an explanation, but please check with MyChart first.  Please remember to sign up for MyChart if you have not done so, as this will be important to you in the future with finding out test results, communicating by private email, and scheduling acute appointments online when needed.  Please return in 6 months, or sooner if needed

## 2016-08-10 NOTE — Assessment & Plan Note (Signed)
Ok for phentermine asd,  to f/u any worsening symptoms or concerns °

## 2016-08-10 NOTE — Progress Notes (Signed)
Subjective:    Patient ID: Bethany Lopez, female    DOB: 07-21-1950, 66 y.o.   MRN: [id number]  HPI  Here for wellness and f/u;  Overall doing ok;  Pt denies Chest pain, worsening SOB, DOE, wheezing, orthopnea, PND, worsening LE edema, palpitations, dizziness or syncope.  Pt denies neurological change such as new headache, facial or extremity weakness.  Pt denies polydipsia, polyuria, or low sugar symptoms. Pt states overall good compliance with treatment and medications, good tolerability, and has been trying to follow appropriate diet.  Pt denies worsening depressive symptoms, suicidal ideation or panic. No fever, night sweats, wt loss, loss of appetite, or other constitutional symptoms.  Pt states good ability with ADL's, has low fall risk, home safety reviewed and adequate, no other significant changes in hearing or vision, and only occasionally active with exercise. 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 De for dxa, GYn care, hep c, labs.   Has chronic stable left sciatica .  Has been off wellbutrin without worsening depression.  Asks for phentermine refill.  Does have several wks ongoing nasal allergy symptoms with clearish congestion, itch and sneezing, without fever, pain, ST, cough, swelling or wheezing. Past Medical History:  Diagnosis Date  . Allergic rhinitis, cause unspecified 05/01/2012  . Asthma   . Carpal tunnel syndrome 06/17/2014  . GERD (gastroesophageal reflux disease) 02/11/2015  . Hyperlipidemia 05/30/2011   NEC/NOS    . Hypertension   . Obesity   . Thrombocytopenia ([place]) 08/12/2015  . Tobacco abuse    Past Surgical History:  Procedure Laterality Date  . arthroscopic right knee     5-6 yrs ago  . TUBAL LIGATION      reports that she has been smoking Cigarettes.  She has been smoking about 0.50 packs per day. She has never used smokeless tobacco. She reports that she drinks alcohol. She reports that she does not use  drugs. family history includes Diabetes in her brother, mother, and sister; Heart disease in her father; Kidney disease in her mother. Allergies  Allergen Reactions  . Lodine [Etodolac] Other (See Comments)    GI upset   No current outpatient prescriptions on file prior to visit.   No current facility-administered medications on file prior to visit.    Review of Systems  Constitutional: Negative for other unusual diaphoresis, sweats, appetite or weight changes HENT: Negative for other worsening hearing loss, ear pain, facial swelling, mouth sores or neck stiffness.   Eyes: Negative for other worsening pain, redness or other visual disturbance.  Respiratory: Negative for other stridor or swelling Cardiovascular: Negative for other palpitations or other chest pain  Gastrointestinal: Negative for worsening diarrhea or loose stools, blood in stool, distention or other pain Genitourinary: Negative for hematuria, flank pain or other change in urine volume.  Musculoskeletal: Negative for myalgias or other joint swelling.  Skin: Negative for other color change, or other wound or worsening drainage.  Neurological: Negative for other syncope or numbness. Hematological: Negative for other adenopathy or swelling Psychiatric/Behavioral: Negative for hallucinations, other worsening agitation, SI, self-injury, or new decreased concentration All other system neg per pt    Objective:   Physical Exam BP (!) 146/84   Pulse 74   Ht 5\' 5"  (1.651 m)   Wt 240 lb (108.9 kg)   SpO2 99%   BMI 39.94 kg/m  VS noted,  Constitutional: Pt is oriented to person, place, and time. Appears well-developed and well-nourished, in no significant  distress and comfortable Head: Normocephalic and atraumatic  Eyes: Conjunctivae and EOM are normal. Pupils are equal, round, and reactive to light Right Ear: External ear normal without discharge Left Ear: External ear normal without discharge Nose: Nose without discharge  or deformity Mouth/Throat: Oropharynx is without other ulcerations and moist  Neck: Normal range of motion. Neck supple. No JVD present. No tracheal deviation present or significant neck LA or mass Cardiovascular: Normal rate, regular rhythm, normal heart sounds and intact distal pulses.   Pulmonary/Chest: WOB normal and breath sounds without rales or wheezing  Abdominal: Soft. Bowel sounds are normal. NT. No HSM  Musculoskeletal: Normal range of motion. Exhibits no edema Lymphadenopathy: Has no other cervical adenopathy.  Neurological: Pt is alert and oriented to person, place, and time. Pt has normal reflexes. No cranial nerve deficit. Motor grossly intact, Gait intact Skin: Skin is warm and dry. No rash noted or new ulcerations Psychiatric:  Has normal mood and affect. Behavior is normal without agitation No other exam findings    Assessment & Plan:

## 2016-08-10 NOTE — Assessment & Plan Note (Signed)
stable overall by history and exam, recent data reviewed with pt, and pt to continue medical treatment as before,  to f/u any worsening symptoms or concerns BP Readings from Last 3 Encounters:  08/10/16 (!) 146/84  03/03/16 (!) 145/88  02/10/16 130/78

## 2016-08-10 NOTE — Assessment & Plan Note (Signed)
stable overall by history and exam, recent data reviewed with pt, and pt to continue medical treatment as before,  to f/u any worsening symptoms or concerns Lab Results  Component Value Date   LDLCALC 66 08/10/2016

## 2016-08-10 NOTE — Progress Notes (Signed)
Pre visit review using our clinic review tool, if applicable. No additional management support is needed unless otherwise documented below in the visit note. 

## 2016-08-10 NOTE — Assessment & Plan Note (Signed)
Mild to mod, for depomedrol IM 80,  to f/u any worsening symptoms or concerns 

## 2016-08-16 ENCOUNTER — Encounter: Payer: Self-pay | Admitting: Internal Medicine

## 2016-09-14 ENCOUNTER — Telehealth: Payer: Self-pay | Admitting: Internal Medicine

## 2016-09-28 ENCOUNTER — Ambulatory Visit (INDEPENDENT_AMBULATORY_CARE_PROVIDER_SITE_OTHER): Payer: Medicare Other | Admitting: Obstetrics and Gynecology

## 2016-09-28 ENCOUNTER — Encounter: Payer: Self-pay | Admitting: Obstetrics and Gynecology

## 2016-09-28 VITALS — BP 159/81 | HR 93 | Ht 65.0 in | Wt 238.5 lb

## 2016-09-28 DIAGNOSIS — Z0000 Encounter for general adult medical examination without abnormal findings: Secondary | ICD-10-CM | POA: Diagnosis not present

## 2016-09-28 NOTE — Progress Notes (Signed)
HPI:      Ms. Bethany Lopez is a 66 y.o. G0P0000 who LMP was No LMP recorded. Patient is postmenopausal.  Subjective:   She presents today for her annual examination.  She has no GYN complaints. Her menses stopped approximately age 41. She would like a Pap smear and the mammogram. She is also interested in transferring her records to a new family practice doctor in [place]. She is finding the drive to [place] difficult as she gets older.    Hx: The following portions of the patient's history were reviewed and updated as appropriate:             She  has a past medical history of Allergic rhinitis, cause unspecified (05/01/2012); Asthma; Carpal tunnel syndrome (06/17/2014); GERD (gastroesophageal reflux disease) (02/11/2015); Hyperlipidemia (05/30/2011); Hypertension; Obesity; Thrombocytopenia ([place]) (08/12/2015); and Tobacco abuse. She  does not have any pertinent problems on file. She  has a past surgical history that includes arthroscopic right knee and Tubal ligation. Her family history includes Diabetes in her brother, mother, and sister; Heart disease in her father; Kidney disease in her mother. She  reports that she has been smoking Cigarettes.  She has been smoking about 0.50 packs per day. She has never used smokeless tobacco. She reports that she drinks alcohol. She reports that she does not use drugs. She is allergic to lodine [etodolac] and penicillins.       Review of Systems:  Review of Systems  Constitutional: Denied constitutional symptoms, night sweats, recent illness, fatigue, fever, insomnia and weight loss.  Eyes: Denied eye symptoms, eye pain, photophobia, vision change and visual disturbance.  Ears/Nose/Throat/Neck: Denied ear, nose, throat or neck symptoms, hearing loss, nasal discharge, sinus congestion and sore throat.  Cardiovascular: Denied cardiovascular symptoms, arrhythmia, chest pain/pressure, edema, exercise intolerance, orthopnea and palpitations.   Respiratory: Denied pulmonary symptoms, asthma, pleuritic pain, productive sputum, cough, dyspnea and wheezing.  Gastrointestinal: Denied, gastro-esophageal reflux, melena, nausea and vomiting.  Genitourinary: Denied genitourinary symptoms including symptomatic vaginal discharge, pelvic relaxation issues, and urinary complaints.  Musculoskeletal: Denied musculoskeletal symptoms, stiffness, swelling, muscle weakness and myalgia.  Dermatologic: Denied dermatology symptoms, rash and scar.  Neurologic: Denied neurology symptoms, dizziness, headache, neck pain and syncope.  Psychiatric: Denied psychiatric symptoms, anxiety and depression.  Endocrine: Denied endocrine symptoms including hot flashes and night sweats.   Meds:   Current Outpatient Prescriptions on File Prior to Visit  Medication Sig Dispense Refill  . albuterol (PROVENTIL HFA;VENTOLIN HFA) 108 (90 Base) MCG/ACT inhaler Inhale 2 puffs into the lungs every 6 (six) hours as needed for wheezing or shortness of breath. 1 Inhaler 11  . amLODipine (NORVASC) 10 MG tablet Take 1 tablet (10 mg total) by mouth daily. 90 tablet 3  . atorvastatin (LIPITOR) 20 MG tablet Take 1 tablet (20 mg total) by mouth daily. 90 tablet 3  . cetirizine (ZYRTEC) 10 MG tablet Take 1 tablet (10 mg total) by mouth daily. 90 tablet 3  . phentermine (ADIPEX-P) 37.5 MG tablet Take 1 tablet (37.5 mg total) by mouth daily before breakfast. 30 tablet 2  . triamcinolone (NASACORT AQ) 55 MCG/ACT AERO nasal inhaler Place 2 sprays into the nose daily. 1 Inhaler 12  . triamcinolone cream (KENALOG) 0.1 % APPLY  CREAM EXTERNALLY TO AFFECTED AREA TWICE DAILY AS NEEDED 80 g 5   No current facility-administered medications on file prior to visit.     Objective:     Vitals:   09/28/16 0951  BP: (!) 159/81  Pulse:  93              Physical examination General NAD, Conversant  HEENT Atraumatic; Op clear with mmm.  Normo-cephalic. Pupils reactive. Anicteric sclerae   Thyroid/Neck Smooth without nodularity or enlargement. Normal ROM.  Neck Supple.  Skin No rashes, lesions or ulceration. Normal palpated skin turgor. No nodularity.  Breasts: No masses or discharge.  Symmetric.  No axillary adenopathy.  Pendulous   Lungs: Clear to auscultation.No rales or wheezes. Normal Respiratory effort, no retractions.  Heart: NSR.  No murmurs or rubs appreciated. No periferal edema  Abdomen: Soft.  Non-tender.  No masses.  No HSM. No hernia  Extremities: Moves all appropriately.  Normal ROM for age. No lymphadenopathy.  Neuro: Oriented to PPT.  Normal mood. Normal affect.     Pelvic:   Vulva: Normal appearance.  No lesions.  Vagina: No lesions or abnormalities noted.  Support: Normal pelvic support.  Urethra No masses tenderness or scarring.  Meatus Normal size without lesions or prolapse.  Cervix: Normal appearance.  No lesions.  Anus: Normal exam.  No lesions.  Perineum: Normal exam.  No lesions.        Bimanual   Uterus: Normal size.  Non-tender.  Mobile.  AV.  Adnexae: No masses.  Non-tender to palpation.  Cul-de-sac: Negative for abnormality.   Exam limited by patient body habitus   Assessment:    G0P0000 Patient Active Problem List   Diagnosis Date Noted  . Sciatica, left side 08/10/2016  . Depression 08/10/2016  . Asthma 02/10/2016  . Asthma with acute exacerbation 02/10/2016  . Thrombocytopenia ([place]) 08/12/2015  . GERD (gastroesophageal reflux disease) 02/11/2015  . Leg swelling 02/11/2015  . Carpal tunnel syndrome 06/17/2014  . Pain and swelling of toe of left foot 06/17/2014  . Thrombocytopenia, unspecified ([place]) 08/07/2012  . Heme positive stool 05/02/2012  . Fatigue 05/02/2012  . Allergic rhinitis 05/01/2012  . Obesity 05/01/2012  . Anemia, unspecified 04/30/2012  . Encounter for preventative adult health care exam with abnormal findings 12/01/2011  . Tobacco use 08/02/2011  . Hypertension 08/02/2011  . Dermatitis 08/02/2011  .  Hyperlipidemia 05/30/2011     1. Encounter for annual physical exam   2. Morbid obesity ([place])        Plan:            1.  Basic Screening Recommendations The basic screening recommendations for asymptomatic women were discussed with the patient during her visit.  The age-appropriate recommendations were discussed with her and the rational for the tests reviewed.  When I am informed by the patient that another primary care physician has previously obtained the age-appropriate tests and they are up-to-date, only outstanding tests are ordered and referrals given as necessary.  Abnormal results of tests will be discussed with her when all of her results are completed. Pap performed-mammogram ordered Orders Orders Placed This Encounter  Procedures  . MM DIGITAL SCREENING BILATERAL  . Ambulatory referral to [facility]    No orders of the defined types were placed in this encounter.       F/U  Return in about 1 year (around 09/28/2017) for Annual Physical.  Finis Bud, M.D. 09/28/2016 10:26 AM

## 2016-09-30 LAB — PAP IG AND HPV HIGH-RISK
HPV, high-risk: NEGATIVE
PAP Smear Comment: 0

## 2016-10-03 ENCOUNTER — Telehealth (HOSPITAL_COMMUNITY): Payer: Self-pay

## 2016-10-03 NOTE — Telephone Encounter (Signed)
-----   Message from Harlin Heys, MD sent at 10/03/2016  9:54 AM EDT ----- Negative Pap and HPV

## 2016-10-03 NOTE — Telephone Encounter (Signed)
Left message on pts voicemail to please return call. Mobile phone does not have a mailbox set up.

## 2016-10-03 NOTE — Telephone Encounter (Signed)
Pt returned call- neg test results per provider given. [facility] phone number given to pt. She will schedule mammogram.

## 2016-11-09 ENCOUNTER — Ambulatory Visit
Admission: RE | Admit: 2016-11-09 | Discharge: 2016-11-09 | Disposition: A | Discharge status: Home / Self Care | Payer: BLUE CROSS/BLUE SHIELD | Source: Ambulatory Visit | Attending: Obstetrics and Gynecology | Admitting: Obstetrics and Gynecology

## 2016-11-09 DIAGNOSIS — Z1231 Encounter for screening mammogram for malignant neoplasm of breast: Secondary | ICD-10-CM | POA: Diagnosis not present

## 2016-11-09 DIAGNOSIS — Z0000 Encounter for general adult medical examination without abnormal findings: Secondary | ICD-10-CM

## 2017-02-15 ENCOUNTER — Encounter: Payer: Self-pay | Admitting: Internal Medicine

## 2017-02-15 ENCOUNTER — Ambulatory Visit (INDEPENDENT_AMBULATORY_CARE_PROVIDER_SITE_OTHER): Payer: BLUE CROSS/BLUE SHIELD | Admitting: Internal Medicine

## 2017-02-15 VITALS — BP 144/88 | HR 76 | Temp 97.8°F | Ht 65.0 in | Wt 247.0 lb

## 2017-02-15 DIAGNOSIS — I10 Essential (primary) hypertension: Secondary | ICD-10-CM

## 2017-02-15 DIAGNOSIS — J309 Allergic rhinitis, unspecified: Secondary | ICD-10-CM | POA: Diagnosis not present

## 2017-02-15 DIAGNOSIS — J45909 Unspecified asthma, uncomplicated: Secondary | ICD-10-CM

## 2017-02-15 DIAGNOSIS — Z23 Encounter for immunization: Secondary | ICD-10-CM

## 2017-02-15 DIAGNOSIS — Z0000 Encounter for general adult medical examination without abnormal findings: Secondary | ICD-10-CM

## 2017-02-15 MED ORDER — METHYLPREDNISOLONE ACETATE 80 MG/ML IJ SUSP
80.0000 mg | Freq: Once | INTRAMUSCULAR | Status: AC
  Administered 2017-02-15: 80 mg via INTRAMUSCULAR

## 2017-02-15 MED ORDER — PHENTERMINE HCL 37.5 MG PO TABS: 37.5000 mg | ORAL_TABLET | Freq: Every day | ORAL | 2 refills | Status: DC

## 2017-02-15 NOTE — Progress Notes (Signed)
Subjective:    Patient ID: Bethany Lopez, female    DOB: 13-Jun-1950, 66 y.o.   MRN: [id number]  HPI  Here to f/u; overall doing ok,  Pt denies chest pain, increasing sob or doe, wheezing, orthopnea, PND, increased LE swelling, palpitations, dizziness or syncope.  Pt denies new neurological symptoms such as new headache, or facial or extremity weakness or numbness.  Pt denies polydipsia, polyuria, or low sugar episode.  Pt states overall good compliance with meds, mostly trying to follow appropriate diet, with wt overall stable,  but little exercise however. Unfortunately gained some wt as accidentally lost adipex down sink. Does have several wks ongoing nasal allergy symptoms with clearish congestion, itch and sneezing, without fever, pain, ST, cough, swelling or wheezing. Wt Readings from Last 3 Encounters:  02/15/17 247 lb (112 kg)  09/28/16 238 lb 8 oz (108.2 kg)  08/10/16 240 lb (108.9 kg)   BP Readings from Last 3 Encounters:  02/15/17 (!) 144/88  09/28/16 (!) 159/81  08/10/16 (!) 146/84  Pt is adamant BP < 140/90 at home.  Pans to ask for less hours at work in [place].   Past Medical History:  Diagnosis Date  . Allergic rhinitis, cause unspecified 05/01/2012  . Asthma   . Carpal tunnel syndrome 06/17/2014  . GERD (gastroesophageal reflux disease) 02/11/2015  . Hyperlipidemia 05/30/2011   NEC/NOS    . Hypertension   . Obesity   . Thrombocytopenia ([place]) 08/12/2015  . Tobacco abuse    Past Surgical History:  Procedure Laterality Date  . arthroscopic right knee     5-6 yrs ago  . TUBAL LIGATION      reports that she has been smoking Cigarettes.  She has been smoking about 0.50 packs per day. She has never used smokeless tobacco. She reports that she drinks alcohol. She reports that she does not use drugs. family history includes Diabetes in her brother, mother, and sister; Heart disease in her father; Kidney disease in her mother. Allergies  Allergen Reactions    . Lodine [Etodolac] Other (See Comments)    GI upset  . Penicillins    Current Outpatient Prescriptions on File Prior to Visit  Medication Sig Dispense Refill  . albuterol (PROVENTIL HFA;VENTOLIN HFA) 108 (90 Base) MCG/ACT inhaler Inhale 2 puffs into the lungs every 6 (six) hours as needed for wheezing or shortness of breath. 1 Inhaler 11  . amLODipine (NORVASC) 10 MG tablet Take 1 tablet (10 mg total) by mouth daily. 90 tablet 3  . atorvastatin (LIPITOR) 20 MG tablet Take 1 tablet (20 mg total) by mouth daily. 90 tablet 3  . cetirizine (ZYRTEC) 10 MG tablet Take 1 tablet (10 mg total) by mouth daily. 90 tablet 3  . triamcinolone (NASACORT AQ) 55 MCG/ACT AERO nasal inhaler Place 2 sprays into the nose daily. 1 Inhaler 12  . triamcinolone cream (KENALOG) 0.1 % APPLY  CREAM EXTERNALLY TO AFFECTED AREA TWICE DAILY AS NEEDED 80 g 5   No current facility-administered medications on file prior to visit.    Review of Systems  Constitutional: Negative for other unusual diaphoresis or sweats HENT: Negative for ear discharge or swelling Eyes: Negative for other worsening visual disturbances Respiratory: Negative for stridor or other swelling  Gastrointestinal: Negative for worsening distension or other blood Genitourinary: Negative for retention or other urinary change Musculoskeletal: Negative for other MSK pain or swelling Skin: Negative for color change or other new lesions Neurological: Negative for worsening tremors and other numbness  Psychiatric/Behavioral: Negative for worsening agitation or other fatigue All other system neg per pt    Objective:   Physical Exam BP (!) 144/88   Pulse 76   Temp 97.8 F (36.6 C) (Oral)   Ht 5\' 5"  (1.651 m)   Wt 247 lb (112 kg)   SpO2 100%   BMI 41.10 kg/m  - VS taken just after walking in VS noted,  Constitutional: Pt appears in NAD HENT: Head: NCAT.  Right Ear: External ear normal.  Left Ear: External ear normal.  Eyes: . Pupils are equal,  round, and reactive to light. Conjunctivae and EOM are normal Nose: without d/c or deformity Bilat tm's with mild erythema.  Max sinus areas non tender.  Pharynx with mild erythema, no exudate Neck: Neck supple. Gross normal ROM Cardiovascular: Normal rate and regular rhythm.   Pulmonary/Chest: Effort normal and breath sounds without rales or wheezing.  Neurological: Pt is alert. At baseline orientation, motor grossly intact Skin: Skin is warm. No rashes, other new lesions, no LE edema Psychiatric: Pt behavior is normal without agitation  No other exam findings    Assessment & Plan:

## 2017-02-15 NOTE — Patient Instructions (Addendum)
Please take all new medication as prescribed - the losartan 50 mg per day for blood pressure  You had the steroid shot today  OK to add the Zyrtec 10 mg OTC unless it is too sedating, then take Allegra OTC instead  Please continue all other medications as before, and refills have been done if requested - the phentermine  Please have the pharmacy call with any other refills you may need.  Please continue your efforts at being more active, low cholesterol diet, and weight control.  Please keep your appointments with your specialists as you may have planned  Please return in 6 months, or sooner if needed, with Lab testing done 3-5 days before

## 2017-02-16 ENCOUNTER — Telehealth: Payer: Self-pay | Admitting: Internal Medicine

## 2017-02-16 MED ORDER — LOSARTAN POTASSIUM 50 MG PO TABS: 50.0000 mg | ORAL_TABLET | Freq: Every day | ORAL | 3 refills | Status: DC

## 2017-02-16 NOTE — Telephone Encounter (Signed)
Pt called stating that Dr Jenny Reichmann was going to send in a new prescription for her but nothing was called in. After looking at this office note it looks like it should be losartan 50 mg. Can this be sent in?  She would like a call when this has been done.

## 2017-02-16 NOTE — Telephone Encounter (Signed)
Informed pt via VM that meds have been corrected and sent to the pharmacy.

## 2017-02-16 NOTE — Telephone Encounter (Signed)
Ok this has been corrected

## 2017-02-17 NOTE — Assessment & Plan Note (Signed)
stable overall by history and exam, recent data reviewed with pt, and pt to continue medical treatment as before,  to f/u any worsening symptoms or concerns  

## 2017-02-17 NOTE — Assessment & Plan Note (Signed)
Mild uncontrolled, for add losartan 50 qd,f/u BP at home and next visist, goal < 140/90

## 2017-02-17 NOTE — Assessment & Plan Note (Signed)
Mild to mod, for  to f/u any worsening symptoms or concerns

## 2017-03-20 NOTE — Telephone Encounter (Signed)
error 

## 2017-08-16 ENCOUNTER — Other Ambulatory Visit (INDEPENDENT_AMBULATORY_CARE_PROVIDER_SITE_OTHER): Payer: BLUE CROSS/BLUE SHIELD

## 2017-08-16 ENCOUNTER — Encounter: Payer: Self-pay | Admitting: Internal Medicine

## 2017-08-16 ENCOUNTER — Ambulatory Visit (INDEPENDENT_AMBULATORY_CARE_PROVIDER_SITE_OTHER): Payer: BLUE CROSS/BLUE SHIELD | Admitting: Internal Medicine

## 2017-08-16 VITALS — BP 134/84 | HR 90 | Temp 98.0°F | Ht 65.0 in | Wt 244.0 lb

## 2017-08-16 DIAGNOSIS — F32A Depression, unspecified: Secondary | ICD-10-CM

## 2017-08-16 DIAGNOSIS — J309 Allergic rhinitis, unspecified: Secondary | ICD-10-CM | POA: Diagnosis not present

## 2017-08-16 DIAGNOSIS — F329 Major depressive disorder, single episode, unspecified: Secondary | ICD-10-CM | POA: Diagnosis not present

## 2017-08-16 DIAGNOSIS — E785 Hyperlipidemia, unspecified: Secondary | ICD-10-CM

## 2017-08-16 DIAGNOSIS — Z0001 Encounter for general adult medical examination with abnormal findings: Secondary | ICD-10-CM | POA: Diagnosis not present

## 2017-08-16 DIAGNOSIS — I10 Essential (primary) hypertension: Secondary | ICD-10-CM

## 2017-08-16 LAB — BASIC METABOLIC PANEL
BUN: 12 mg/dL (ref 6–23)
CALCIUM: 9.3 mg/dL (ref 8.4–10.5)
CO2: 29 meq/L (ref 19–32)
Chloride: 104 mEq/L (ref 96–112)
Creatinine, Ser: 0.91 mg/dL (ref 0.40–1.20)
GFR: 78.91 mL/min (ref >60.00)
GLUCOSE: 99 mg/dL (ref 70–99)
POTASSIUM: 4.5 meq/L (ref 3.5–5.1)
SODIUM: 140 meq/L (ref 135–145)

## 2017-08-16 LAB — LIPID PANEL
CHOL/HDL RATIO: 2
Cholesterol: 140 mg/dL (ref 0–200)
HDL: 63.9 mg/dL (ref >39.00)
LDL Cholesterol: 57 mg/dL (ref 0–99)
NonHDL: 75.95
TRIGLYCERIDES: 97 mg/dL (ref 0.0–149.0)
VLDL: 19.4 mg/dL (ref 0.0–40.0)

## 2017-08-16 LAB — CBC WITH DIFFERENTIAL/PLATELET
BASOS PCT: 1.2 % (ref 0.0–3.0)
Basophils Absolute: 0.1 10*3/uL (ref 0.0–0.1)
EOS PCT: 6.9 % — AB (ref 0.0–5.0)
Eosinophils Absolute: 0.4 10*3/uL (ref 0.0–0.7)
HCT: 40.3 % (ref 36.0–46.0)
Hemoglobin: 13 g/dL (ref 12.0–15.0)
LYMPHS ABS: 1.7 10*3/uL (ref 0.7–4.0)
Lymphocytes Relative: 31.2 % (ref 12.0–46.0)
MCHC: 32.3 g/dL (ref 30.0–36.0)
MCV: 88.4 fl (ref 78.0–100.0)
MONO ABS: 0.5 10*3/uL (ref 0.1–1.0)
MONOS PCT: 9 % (ref 3.0–12.0)
NEUTROS ABS: 2.8 10*3/uL (ref 1.4–7.7)
NEUTROS PCT: 51.7 % (ref 43.0–77.0)
Platelets: 113 10*3/uL — ABNORMAL LOW (ref 150.0–400.0)
RBC: 4.56 Mil/uL (ref 3.87–5.11)
RDW: 15.6 % — AB (ref 11.5–15.5)
WBC: 5.4 10*3/uL (ref 4.0–10.5)

## 2017-08-16 LAB — HEPATIC FUNCTION PANEL
ALT: 26 U/L (ref 0–35)
AST: 27 U/L (ref 0–37)
Albumin: 3.9 g/dL (ref 3.5–5.2)
Alkaline Phosphatase: 57 U/L (ref 39–117)
BILIRUBIN TOTAL: 0.6 mg/dL (ref 0.2–1.2)
Bilirubin, Direct: 0.1 mg/dL (ref 0.0–0.3)
Total Protein: 7.1 g/dL (ref 6.0–8.3)

## 2017-08-16 LAB — URINALYSIS, ROUTINE W REFLEX MICROSCOPIC
BILIRUBIN URINE: NEGATIVE
HGB URINE DIPSTICK: NEGATIVE
KETONES UR: NEGATIVE
Leukocytes, UA: NEGATIVE
NITRITE: NEGATIVE
Specific Gravity, Urine: 1.015 (ref 1.000–1.030)
TOTAL PROTEIN, URINE-UPE24: NEGATIVE
URINE GLUCOSE: NEGATIVE
UROBILINOGEN UA: 0.2 (ref 0.0–1.0)
pH: 7.5 (ref 5.0–8.0)

## 2017-08-16 LAB — TSH: TSH: 1.86 u[IU]/mL (ref 0.35–4.50)

## 2017-08-16 MED ORDER — METHYLPREDNISOLONE ACETATE 80 MG/ML IJ SUSP
80.0000 mg | Freq: Once | INTRAMUSCULAR | Status: AC
  Administered 2017-08-16: 80 mg via INTRAMUSCULAR

## 2017-08-16 MED ORDER — ATORVASTATIN CALCIUM 20 MG PO TABS: 20.0000 mg | ORAL_TABLET | Freq: Every day | ORAL | 1 refills | Status: DC

## 2017-08-16 MED ORDER — TRIAMCINOLONE ACETONIDE 55 MCG/ACT NA AERO: 2.0000 | INHALATION_SPRAY | Freq: Every day | NASAL | 12 refills | Status: DC

## 2017-08-16 NOTE — Progress Notes (Signed)
Subjective:    Patient ID: Bethany Lopez, female    DOB: Mar 23, 1951, 67 y.o.   MRN: [id number]  HPI  Here for wellness and f/u;  Overall doing ok;  Pt denies Chest pain, worsening SOB, DOE, wheezing, orthopnea, PND, worsening LE edema, palpitations, dizziness or syncope.  Pt denies neurological change such as new headache, facial or extremity weakness.  Pt denies polydipsia, polyuria, or low sugar symptoms. Pt states overall good compliance with treatment and medications, good tolerability, and has been trying to follow appropriate diet.  Pt denies worsening depressive symptoms, suicidal ideation or panic. No fever, night sweats, wt loss, loss of appetite, or other constitutional symptoms.  Pt states good ability with ADL's, has low fall risk, home safety reviewed and adequate, no other significant changes in hearing or vision, and only occasionally active with exercise Wt Readings from Last 3 Encounters:  08/16/17 244 lb (110.7 kg)  02/15/17 247 lb (112 kg)  09/28/16 238 lb 8 oz (108.2 kg)   BP Readings from Last 3 Encounters:  08/16/17 134/84  02/15/17 (!) 144/88  09/28/16 (!) 159/81  Does have several wks ongoing nasal allergy symptoms with clearish congestion, itch and sneezing, without fever, pain, ST, cough, swelling or wheezing.  No other new complaints or interval hx Past Medical History:  Diagnosis Date  . Allergic rhinitis, cause unspecified 05/01/2012  . Asthma   . Carpal tunnel syndrome 06/17/2014  . GERD (gastroesophageal reflux disease) 02/11/2015  . Hyperlipidemia 05/30/2011   NEC/NOS    . Hypertension   . Obesity   . Thrombocytopenia ([place]) 08/12/2015  . Tobacco abuse    Past Surgical History:  Procedure Laterality Date  . arthroscopic right knee     5-6 yrs ago  . TUBAL LIGATION      reports that she has been smoking cigarettes.  She has been smoking about 0.50 packs per day. She has never used smokeless tobacco. She reports that she drinks alcohol. She  reports that she does not use drugs. family history includes Diabetes in her brother, mother, and sister; Heart disease in her father; Kidney disease in her mother. Allergies  Allergen Reactions  . Lodine [Etodolac] Other (See Comments)    GI upset  . Penicillins    Current Outpatient Medications on File Prior to Visit  Medication Sig Dispense Refill  . albuterol (PROVENTIL HFA;VENTOLIN HFA) 108 (90 Base) MCG/ACT inhaler Inhale 2 puffs into the lungs every 6 (six) hours as needed for wheezing or shortness of breath. 1 Inhaler 11  . amLODipine (NORVASC) 10 MG tablet Take 1 tablet (10 mg total) by mouth daily. 90 tablet 3  . amoxicillin (AMOXIL) 500 MG tablet Take 500 mg by mouth 2 (two) times daily.    Marland Kitchen ibuprofen (ADVIL,MOTRIN) 800 MG tablet Take 800 mg by mouth every 8 (eight) hours as needed.    Marland Kitchen losartan (COZAAR) 50 MG tablet Take 1 tablet (50 mg total) by mouth daily. 90 tablet 3  . phentermine (ADIPEX-P) 37.5 MG tablet Take 1 tablet (37.5 mg total) by mouth daily before breakfast. 30 tablet 2  . triamcinolone cream (KENALOG) 0.1 % APPLY  CREAM EXTERNALLY TO AFFECTED AREA TWICE DAILY AS NEEDED 80 g 5  . cetirizine (ZYRTEC) 10 MG tablet Take 1 tablet (10 mg total) by mouth daily. 90 tablet 3   No current facility-administered medications on file prior to visit.    Review of Systems Constitutional: Negative for other unusual diaphoresis, sweats, appetite or weight changes HENT:  Negative for other worsening hearing loss, ear pain, facial swelling, mouth sores or neck stiffness.   Eyes: Negative for other worsening pain, redness or other visual disturbance.  Respiratory: Negative for other stridor or swelling Cardiovascular: Negative for other palpitations or other chest pain  Gastrointestinal: Negative for worsening diarrhea or loose stools, blood in stool, distention or other pain Genitourinary: Negative for hematuria, flank pain or other change in urine volume.  Musculoskeletal:  Negative for myalgias or other joint swelling.  Skin: Negative for other color change, or other wound or worsening drainage.  Neurological: Negative for other syncope or numbness. Hematological: Negative for other adenopathy or swelling Psychiatric/Behavioral: Negative for hallucinations, other worsening agitation, SI, self-injury, or new decreased concentration All other system neg per pt    Objective:   Physical Exam BP 134/84   Pulse 90   Temp 98 F (36.7 C) (Oral)   Ht 5\' 5"  (1.651 m)   Wt 244 lb (110.7 kg)   SpO2 95%   BMI 40.60 kg/m  VS noted, not ill appearing Constitutional: Pt is oriented to person, place, and time. Appears well-developed and well-nourished, in no significant distress and comfortable Bilat tm's with mild erythema.  Max sinus areas mild tender.  Pharynx with mild erythema, no exudate Head: Normocephalic and atraumatic  Eyes: Conjunctivae and EOM are normal. Pupils are equal, round, and reactive to light Right Ear: External ear normal without discharge Left Ear: External ear normal without discharge Nose: Nose without discharge or deformity Mouth/Throat: Oropharynx is without other ulcerations and moist  Neck: Normal range of motion. Neck supple. No JVD present. No tracheal deviation present or significant neck LA or mass Cardiovascular: Normal rate, regular rhythm, normal heart sounds and intact distal pulses.   Pulmonary/Chest: WOB normal and breath sounds without rales or wheezing  Abdominal: Soft. Bowel sounds are normal. NT. No HSM  Musculoskeletal: Normal range of motion. Exhibits no edema Lymphadenopathy: Has no other cervical adenopathy.  Neurological: Pt is alert and oriented to person, place, and time. Pt has normal reflexes. No cranial nerve deficit. Motor grossly intact, Gait intact Skin: Skin is warm and dry. No rash noted or new ulcerations Psychiatric:  Has normal mood and affect. Behavior is normal without agitation, not depressed affect No  other exam findings  Lab Results  Component Value Date   WBC 4.6 08/10/2016   HGB 14.7 08/10/2016   HCT 45.7 08/10/2016   PLT 110.0 (L) 08/10/2016   GLUCOSE 92 08/10/2016   CHOL 169 08/10/2016   TRIG 119.0 08/10/2016   HDL 79.30 08/10/2016   LDLDIRECT 143.9 08/02/2011   LDLCALC 66 08/10/2016   ALT 31 08/10/2016   AST 31 08/10/2016   NA 139 08/10/2016   K 4.1 08/10/2016   CL 105 08/10/2016   CREATININE 0.85 08/10/2016   BUN 17 08/10/2016   CO2 30 08/10/2016   TSH 1.45 08/10/2016   INR 1.1 11/12/2012        Assessment & Plan:

## 2017-08-16 NOTE — Patient Instructions (Signed)
You had the steroid shot today  Please continue all other medications as before  Please have the pharmacy call with any other refills you may need.  Please continue your efforts at being more active, low cholesterol diet, and weight control.  You are otherwise up to date with prevention measures today.  Please keep your appointments with your specialists as you may have planned  Please go to the LAB in the Basement (turn left off the elevator) for the tests to be done today  You will be contacted by phone if any changes need to be made immediately.  Otherwise, you will receive a letter about your results with an explanation, but please check with MyChart first.  Please remember to sign up for MyChart if you have not done so, as this will be important to you in the future with finding out test results, communicating by private email, and scheduling acute appointments online when needed.  Please return in 1 year for your yearly visit, or sooner if needed, with Lab testing done 3-5 days before

## 2017-08-19 NOTE — Assessment & Plan Note (Signed)
BP Readings from Last 3 Encounters:  08/16/17 134/84  02/15/17 (!) 144/88  09/28/16 (!) 159/81

## 2017-08-19 NOTE — Assessment & Plan Note (Addendum)
Mild to mod, for depomedrol IM 80, to f/u any worsening symptoms or concerns  In addition to the time spent performing CPE, I spent an additional 15 minutes face to face,in which greater than 50% of this time was spent in counseling and coordination of care for patient's illness as documented, including the differential dx, treatment, further evaluation and other management of allergic rhinitis, HTN, hyperglycemia, HLD, and depression

## 2017-08-19 NOTE — Assessment & Plan Note (Signed)
stable overall by history and exam, recent data reviewed with pt, and pt to continue medical treatment as before,  to f/u any worsening symptoms or concerns  

## 2017-08-19 NOTE — Assessment & Plan Note (Signed)
Lab Results  Component Value Date   LDLCALC 57 08/16/2017  stable overall by history and exam, recent data reviewed with pt, and pt to continue medical treatment as before,  to f/u any worsening symptoms or concerns

## 2017-08-19 NOTE — Assessment & Plan Note (Signed)

## 2017-09-29 ENCOUNTER — Other Ambulatory Visit: Payer: Self-pay | Admitting: Internal Medicine

## 2017-12-25 ENCOUNTER — Other Ambulatory Visit: Payer: Self-pay | Admitting: Internal Medicine

## 2017-12-25 DIAGNOSIS — Z1231 Encounter for screening mammogram for malignant neoplasm of breast: Secondary | ICD-10-CM

## 2018-01-10 ENCOUNTER — Ambulatory Visit
Admission: RE | Admit: 2018-01-10 | Discharge: 2018-01-10 | Disposition: A | Discharge status: Home / Self Care | Payer: BLUE CROSS/BLUE SHIELD | Source: Ambulatory Visit | Attending: Internal Medicine | Admitting: Internal Medicine

## 2018-01-10 DIAGNOSIS — Z1231 Encounter for screening mammogram for malignant neoplasm of breast: Secondary | ICD-10-CM | POA: Diagnosis not present

## 2018-01-28 ENCOUNTER — Other Ambulatory Visit: Payer: Self-pay | Admitting: Internal Medicine

## 2018-01-28 NOTE — Telephone Encounter (Signed)
Copied from [place] [id number]. Topic: Quick Communication - Rx Refill/Question >> Jan 28, 2018  5:26 PM Waylan Rocher, Lumin L wrote: Medication: triamcinolone cream (KENALOG) 0.1 % (rash on arms, contacted pharmacy 2 times over past week, requested high priority)  Has the patient contacted their pharmacy? Yes.   (Agent: If no, request that the patient contact the pharmacy for the refill.) (Agent: If yes, when and what did the pharmacy advise?)  Preferred Pharmacy (with phone number or street name): [facility] [address], [place] - [place] [address] Phone: [phone] Fax: [phone]  Agent: Please be advised that RX refills may take up to 3 business days. We ask that you follow-up with your pharmacy.

## 2018-01-29 MED ORDER — TRIAMCINOLONE ACETONIDE 0.1 % EX CREA: TOPICAL_CREAM | CUTANEOUS | 1 refills | Status: DC

## 2018-02-10 ENCOUNTER — Other Ambulatory Visit: Payer: Self-pay | Admitting: Internal Medicine

## 2018-03-04 ENCOUNTER — Other Ambulatory Visit: Payer: Self-pay | Admitting: Internal Medicine

## 2018-03-13 ENCOUNTER — Other Ambulatory Visit: Payer: Self-pay | Admitting: Internal Medicine

## 2018-05-23 ENCOUNTER — Telehealth: Payer: Self-pay | Admitting: Internal Medicine

## 2018-05-23 NOTE — Telephone Encounter (Signed)
Copied from [place] [id number]. Topic: General - Other >> May 23, 2018  2:48 PM Judyann Munson wrote: Reason for CRM:  patient is requesting to have a booster shot completed  at [place] clinic. Her best contact number is [phone]

## 2018-05-23 NOTE — Telephone Encounter (Signed)
Unfortunately, a steroid shot is not given to patients for an "energy boost" and requires medical rationale for why it was given during an OV. If the patient feels she is lacking energy then I suggest that she schedule an OV to discuss with PCP. PCP may want to do lab work to see if any of her levels are below average as well as an evaluation.

## 2018-05-23 NOTE — Telephone Encounter (Signed)
Called patient for clarification on CRM below. Patient states that she gets an injection in the office that gives her a "boost", energy, and makes her feel better. I am assuming she is talking about the methylPREDNISolone acetate injection.  She said that she feels that she should get this more often than when she is here for a visit and asked if this is something she could have done at the [facility] in [place]. Please advise.

## 2018-05-23 NOTE — Telephone Encounter (Signed)
Appointment scheduled with Dr Jenny Reichmann.

## 2018-06-13 ENCOUNTER — Encounter: Payer: Self-pay | Admitting: Internal Medicine

## 2018-06-13 ENCOUNTER — Other Ambulatory Visit (INDEPENDENT_AMBULATORY_CARE_PROVIDER_SITE_OTHER): Payer: BLUE CROSS/BLUE SHIELD

## 2018-06-13 ENCOUNTER — Ambulatory Visit (INDEPENDENT_AMBULATORY_CARE_PROVIDER_SITE_OTHER): Payer: BLUE CROSS/BLUE SHIELD | Admitting: Internal Medicine

## 2018-06-13 VITALS — BP 138/90 | HR 89 | Temp 98.5°F | Ht 65.0 in | Wt 250.0 lb

## 2018-06-13 DIAGNOSIS — J4521 Mild intermittent asthma with (acute) exacerbation: Secondary | ICD-10-CM

## 2018-06-13 DIAGNOSIS — F32A Depression, unspecified: Secondary | ICD-10-CM

## 2018-06-13 DIAGNOSIS — R609 Edema, unspecified: Secondary | ICD-10-CM

## 2018-06-13 DIAGNOSIS — M5432 Sciatica, left side: Secondary | ICD-10-CM | POA: Diagnosis not present

## 2018-06-13 DIAGNOSIS — E785 Hyperlipidemia, unspecified: Secondary | ICD-10-CM

## 2018-06-13 DIAGNOSIS — F329 Major depressive disorder, single episode, unspecified: Secondary | ICD-10-CM

## 2018-06-13 DIAGNOSIS — I10 Essential (primary) hypertension: Secondary | ICD-10-CM

## 2018-06-13 LAB — HEPATIC FUNCTION PANEL
ALT: 32 U/L (ref 0–35)
AST: 30 U/L (ref 0–37)
Albumin: 4 g/dL (ref 3.5–5.2)
Alkaline Phosphatase: 65 U/L (ref 39–117)
BILIRUBIN TOTAL: 0.5 mg/dL (ref 0.2–1.2)
Bilirubin, Direct: 0.1 mg/dL (ref 0.0–0.3)
Total Protein: 7 g/dL (ref 6.0–8.3)

## 2018-06-13 LAB — URINALYSIS, ROUTINE W REFLEX MICROSCOPIC
Bilirubin Urine: NEGATIVE
HGB URINE DIPSTICK: NEGATIVE
Ketones, ur: NEGATIVE
Leukocytes,Ua: NEGATIVE
Nitrite: NEGATIVE
Specific Gravity, Urine: 1.02 (ref 1.000–1.030)
Total Protein, Urine: NEGATIVE
Urine Glucose: NEGATIVE
Urobilinogen, UA: 0.2 (ref 0.0–1.0)
pH: 6.5 (ref 5.0–8.0)

## 2018-06-13 LAB — CBC WITH DIFFERENTIAL/PLATELET
Basophils Absolute: 0.1 10*3/uL (ref 0.0–0.1)
Basophils Relative: 1.5 % (ref 0.0–3.0)
Eosinophils Absolute: 0.4 10*3/uL (ref 0.0–0.7)
Eosinophils Relative: 6.3 % — ABNORMAL HIGH (ref 0.0–5.0)
HCT: 43.9 % (ref 36.0–46.0)
Hemoglobin: 14.5 g/dL (ref 12.0–15.0)
LYMPHS ABS: 1.6 10*3/uL (ref 0.7–4.0)
Lymphocytes Relative: 27.3 % (ref 12.0–46.0)
MCHC: 33 g/dL (ref 30.0–36.0)
MCV: 85.3 fl (ref 78.0–100.0)
MONO ABS: 0.5 10*3/uL (ref 0.1–1.0)
Monocytes Relative: 8.5 % (ref 3.0–12.0)
Neutro Abs: 3.2 10*3/uL (ref 1.4–7.7)
Neutrophils Relative %: 56.4 % (ref 43.0–77.0)
Platelets: 140 10*3/uL — ABNORMAL LOW (ref 150.0–400.0)
RBC: 5.14 Mil/uL — ABNORMAL HIGH (ref 3.87–5.11)
RDW: 16 % — ABNORMAL HIGH (ref 11.5–15.5)
WBC: 5.7 10*3/uL (ref 4.0–10.5)

## 2018-06-13 LAB — LIPID PANEL
CHOL/HDL RATIO: 2
Cholesterol: 168 mg/dL (ref 0–200)
HDL: 72.7 mg/dL (ref >39.00)
NONHDL: 95.25
Triglycerides: 278 mg/dL — ABNORMAL HIGH (ref 0.0–149.0)
VLDL: 55.6 mg/dL — AB (ref 0.0–40.0)

## 2018-06-13 LAB — BASIC METABOLIC PANEL
BUN: 18 mg/dL (ref 6–23)
CHLORIDE: 107 meq/L (ref 96–112)
CO2: 26 meq/L (ref 19–32)
Calcium: 9.4 mg/dL (ref 8.4–10.5)
Creatinine, Ser: 1.08 mg/dL (ref 0.40–1.20)
GFR: 60.78 mL/min (ref >60.00)
Glucose, Bld: 88 mg/dL (ref 70–99)
Potassium: 4.2 mEq/L (ref 3.5–5.1)
Sodium: 141 mEq/L (ref 135–145)

## 2018-06-13 LAB — LDL CHOLESTEROL, DIRECT: LDL DIRECT: 74 mg/dL

## 2018-06-13 LAB — TSH: TSH: 2.08 u[IU]/mL (ref 0.35–4.50)

## 2018-06-13 MED ORDER — IBUPROFEN 800 MG PO TABS: 800.0000 mg | ORAL_TABLET | Freq: Three times a day (TID) | ORAL | 3 refills | Status: DC | PRN

## 2018-06-13 MED ORDER — METHYLPREDNISOLONE ACETATE 80 MG/ML IJ SUSP
80.0000 mg | Freq: Once | INTRAMUSCULAR | Status: AC
  Administered 2018-06-13: 80 mg via INTRAMUSCULAR

## 2018-06-13 MED ORDER — ATORVASTATIN CALCIUM 20 MG PO TABS: 20.0000 mg | ORAL_TABLET | Freq: Every day | ORAL | 3 refills | Status: DC

## 2018-06-13 MED ORDER — AMLODIPINE BESYLATE 10 MG PO TABS: 10.0000 mg | ORAL_TABLET | Freq: Every day | ORAL | 3 refills | Status: DC

## 2018-06-13 NOTE — Patient Instructions (Signed)
You had the steroid shot today  Please continue all other medications as before, and refills have been done if requested.  Please have the pharmacy call with any other refills you may need.  Please continue your efforts at being more active, low cholesterol diet, and weight control.  You are otherwise up to date with prevention measures today.  Please keep your appointments with your specialists as you may have planned  You will be contacted regarding the referral for: Physical Therapy in [place]  Please go to the LAB in the Basement (turn left off the elevator) for the tests to be done today  You will be contacted by phone if any changes need to be made immediately.  Otherwise, you will receive a letter about your results with an explanation, but please check with MyChart first.  Please remember to sign up for MyChart if you have not done so, as this will be important to you in the future with finding out test results, communicating by private email, and scheduling acute appointments online when needed.  Please return in 6 months, or sooner if needed

## 2018-06-13 NOTE — Assessment & Plan Note (Signed)
stable overall by history and exam, recent data reviewed with pt, and pt to continue medical treatment as before,  to f/u any worsening symptoms or concerns  

## 2018-06-13 NOTE — Progress Notes (Signed)
Subjective:    Patient ID: Bethany Lopez, female    DOB: 09-Feb-1951, 68 y.o.   MRN: [id number]  HPI  Here to f/u; overall doing ok,  Pt denies chest pain, orthopnea, PND, increased LE swelling, palpitations, dizziness or syncope, but has 1-2 days onset mild chest congestion, wheeziness, sob and very mild doe.  Pt denies new neurological symptoms such as new headache, or facial or extremity weakness or numbness.  Pt denies polydipsia, polyuria, or low sugar episode.  Pt states overall good compliance with meds, mostly trying to follow appropriate diet, with wt overall stable,  but little exercise however. Has gained several lbs with higher calorie diet.   Wt Readings from Last 3 Encounters:  06/13/18 250 lb (113.4 kg)  08/16/17 244 lb (110.7 kg)  02/15/17 247 lb (112 kg)   BP Readings from Last 3 Encounters:  06/13/18 138/90  08/16/17 134/84  02/15/17 (!) 144/88  Pt continues to have recurring left LBP without change in severity, bowel or bladder change, fever, wt loss,  worsening LE numbness/weakness, gait change or falls, but has LLE sciatica like pain.  Has done well with PT in the past, feels she may need again.   Past Medical History:  Diagnosis Date  . Allergic rhinitis, cause unspecified 05/01/2012  . Asthma   . Carpal tunnel syndrome 06/17/2014  . GERD (gastroesophageal reflux disease) 02/11/2015  . Hyperlipidemia 05/30/2011   NEC/NOS    . Hypertension   . Obesity   . Thrombocytopenia ([place]) 08/12/2015  . Tobacco abuse    Past Surgical History:  Procedure Laterality Date  . arthroscopic right knee     5-6 yrs ago  . TUBAL LIGATION      reports that she has been smoking cigarettes. She has been smoking about 0.50 packs per day. She has never used smokeless tobacco. She reports current alcohol use. She reports that she does not use drugs. family history includes Diabetes in her brother, mother, and sister; Heart disease in her father; Kidney disease in her  mother. Allergies  Allergen Reactions  . Lodine [Etodolac] Other (See Comments)    GI upset  . Penicillins    Current Outpatient Medications on File Prior to Visit  Medication Sig Dispense Refill  . albuterol (PROVENTIL HFA;VENTOLIN HFA) 108 (90 Base) MCG/ACT inhaler Inhale 2 puffs into the lungs every 6 (six) hours as needed for wheezing or shortness of breath. 1 Inhaler 11  . albuterol (PROVENTIL HFA;VENTOLIN HFA) 108 (90 Base) MCG/ACT inhaler INHALE TWO PUFFS BY MOUTH EVERY 6 HOURS AS NEEDED FOR WHEEZING OR  SHORTNESS  OF  BREATH 18 each 2  . losartan (COZAAR) 50 MG tablet TAKE 1 TABLET BY MOUTH ONCE DAILY 90 tablet 3  . triamcinolone (NASACORT AQ) 55 MCG/ACT AERO nasal inhaler Place 2 sprays into the nose daily. 1 Inhaler 12  . triamcinolone cream (KENALOG) 0.1 % APPLY  CREAM EXTERNALLY TO AFFECTED AREA TWICE DAILY AS NEEDED 80 g 1  . cetirizine (ZYRTEC) 10 MG tablet Take 1 tablet (10 mg total) by mouth daily. 90 tablet 3   No current facility-administered medications on file prior to visit.    Review of Systems  Constitutional: Negative for other unusual diaphoresis or sweats HENT: Negative for ear discharge or swelling Eyes: Negative for other worsening visual disturbances Respiratory: Negative for stridor or other swelling  Gastrointestinal: Negative for worsening distension or other blood Genitourinary: Negative for retention or other urinary change Musculoskeletal: Negative for other MSK pain or  swelling Skin: Negative for color change or other new lesions Neurological: Negative for worsening tremors and other numbness  Psychiatric/Behavioral: Negative for worsening agitation or other fatigue All other system neg per pt    Objective:   Physical Exam BP 138/90   Pulse 89   Temp 98.5 F (36.9 C) (Oral)   Ht 5\' 5"  (1.651 m)   Wt 250 lb (113.4 kg)   SpO2 97%   BMI 41.60 kg/m  VS noted,  Constitutional: Pt appears in NAD HENT: Head: NCAT.  Right Ear: External ear  normal.  Left Ear: External ear normal.  Eyes: . Pupils are equal, round, and reactive to light. Conjunctivae and EOM are normal Nose: without d/c or deformity Neck: Neck supple. Gross normal ROM Cardiovascular: Normal rate and regular rhythm.   Pulmonary/Chest: Effort normal and breath sounds without rales or wheezing.  Abd:  Soft, NT, ND, + BS, no organomegaly Neurological: Pt is alert. At baseline orientation, motor grossly intact Skin: Skin is warm. No rashes, other new lesions,  Psychiatric: Pt behavior is normal without agitation , not depressed affect Chronic 1+ edema bilat Lab Results  Component Value Date   WBC 5.4 08/16/2017   HGB 13.0 08/16/2017   HCT 40.3 08/16/2017   PLT 113.0 (L) 08/16/2017   GLUCOSE 99 08/16/2017   CHOL 140 08/16/2017   TRIG 97.0 08/16/2017   HDL 63.90 08/16/2017   LDLDIRECT 143.9 08/02/2011   LDLCALC 57 08/16/2017   ALT 26 08/16/2017   AST 27 08/16/2017   NA 140 08/16/2017   K 4.5 08/16/2017   CL 104 08/16/2017   CREATININE 0.91 08/16/2017   BUN 12 08/16/2017   CO2 29 08/16/2017   TSH 1.86 08/16/2017   INR 1.1 11/12/2012       Assessment & Plan:

## 2018-06-13 NOTE — Assessment & Plan Note (Signed)
For PT in [place] per pt reqeust, o/w stable overall by history and exam, recent data reviewed with pt, and pt to continue medical treatment as before,  to f/u any worsening symptoms or concerns

## 2018-06-13 NOTE — Assessment & Plan Note (Signed)
stable overall by history and exam, recent data reviewed with pt, and pt to continue medical treatment as before,  to f/u any worsening symptoms or concerns Lab Results  Component Value Date   LDLCALC 57 08/16/2017

## 2018-06-13 NOTE — Assessment & Plan Note (Signed)
Mild to mod, for depomedrol IM 80,  to f/u any worsening symptoms or concerns 

## 2018-06-25 ENCOUNTER — Other Ambulatory Visit: Payer: Self-pay | Admitting: Internal Medicine

## 2018-06-25 DIAGNOSIS — M545 Low back pain, unspecified: Secondary | ICD-10-CM

## 2018-08-22 ENCOUNTER — Encounter: Payer: BLUE CROSS/BLUE SHIELD | Admitting: Internal Medicine

## 2018-10-31 ENCOUNTER — Encounter: Payer: BLUE CROSS/BLUE SHIELD | Admitting: Internal Medicine

## 2018-12-03 ENCOUNTER — Encounter: Payer: BLUE CROSS/BLUE SHIELD | Admitting: Physical Therapy

## 2018-12-12 ENCOUNTER — Other Ambulatory Visit: Payer: Self-pay

## 2018-12-12 ENCOUNTER — Ambulatory Visit: Payer: BC Managed Care – PPO | Admitting: Internal Medicine

## 2018-12-12 ENCOUNTER — Ambulatory Visit (INDEPENDENT_AMBULATORY_CARE_PROVIDER_SITE_OTHER): Payer: BC Managed Care – PPO | Admitting: Internal Medicine

## 2018-12-12 DIAGNOSIS — J45909 Unspecified asthma, uncomplicated: Secondary | ICD-10-CM

## 2018-12-12 DIAGNOSIS — M25562 Pain in left knee: Secondary | ICD-10-CM | POA: Diagnosis not present

## 2018-12-12 DIAGNOSIS — J309 Allergic rhinitis, unspecified: Secondary | ICD-10-CM | POA: Diagnosis not present

## 2018-12-12 DIAGNOSIS — G8929 Other chronic pain: Secondary | ICD-10-CM

## 2018-12-12 MED ORDER — DICLOFENAC SODIUM 1 % TD GEL: 2.0000 g | Freq: Four times a day (QID) | TRANSDERMAL | 5 refills | Status: DC | PRN

## 2018-12-12 MED ORDER — PREDNISONE 10 MG PO TABS: ORAL_TABLET | ORAL | 0 refills | Status: DC

## 2018-12-12 NOTE — Progress Notes (Signed)
Patient ID: Ebelia Marlo, female   DOB: Dec 05, 1950, 68 y.o.   MRN: [id number]  Cumulative time during 7-day interval 12 min,, there was not an associated office visit for this concern within a 7 day period.  Verbal consent for services obtained from patient prior to services given . Names of all persons present for services: Cathlean Cower, MD, patient  Chief complaint: sob  History, background, results pertinent:  Here to f/u; overall doing ok,  Pt denies chest pain, increasing sob or doe, orthopnea, PND, increased LE swelling, palpitations, dizziness or syncope.  Pt denies new neurological symptoms such as new headache, or facial or extremity weakness or numbness.  Pt denies polydipsia, polyuria.  Does have several wks ongoing nasal allergy symptoms with clearish congestion, itch and sneezing, x 3 days acute worsening sinus congestion clear color snd sob and wheezing, but without fever, pain, ST, cough, swelling.  Also left knee has had increased pain for 1 mo, seen in ED, worse to stand up after sitting, asks for further tx and referral. Past Medical History:  Diagnosis Date  . Allergic rhinitis, cause unspecified 05/01/2012  . Asthma   . Carpal tunnel syndrome 06/17/2014  . GERD (gastroesophageal reflux disease) 02/11/2015  . Hyperlipidemia 05/30/2011   NEC/NOS    . Hypertension   . Obesity   . Thrombocytopenia ([place]) 08/12/2015  . Tobacco abuse    No results found for this or any previous visit (from the past 48 hour(s)). Current Outpatient Medications on File Prior to Visit  Medication Sig Dispense Refill  . albuterol (PROVENTIL HFA;VENTOLIN HFA) 108 (90 Base) MCG/ACT inhaler Inhale 2 puffs into the lungs every 6 (six) hours as needed for wheezing or shortness of breath. 1 Inhaler 11  . albuterol (PROVENTIL HFA;VENTOLIN HFA) 108 (90 Base) MCG/ACT inhaler INHALE TWO PUFFS BY MOUTH EVERY 6 HOURS AS NEEDED FOR WHEEZING OR  SHORTNESS  OF  BREATH 18 each 2  . amLODipine (NORVASC) 10 MG  tablet Take 1 tablet (10 mg total) by mouth daily. 90 tablet 3  . atorvastatin (LIPITOR) 20 MG tablet Take 1 tablet (20 mg total) by mouth daily. 90 tablet 3  . cetirizine (ZYRTEC) 10 MG tablet Take 1 tablet (10 mg total) by mouth daily. 90 tablet 3  . ibuprofen (ADVIL,MOTRIN) 800 MG tablet Take 1 tablet (800 mg total) by mouth every 8 (eight) hours as needed. 90 tablet 3  . losartan (COZAAR) 50 MG tablet TAKE 1 TABLET BY MOUTH ONCE DAILY 90 tablet 3  . triamcinolone (NASACORT AQ) 55 MCG/ACT AERO nasal inhaler Place 2 sprays into the nose daily. 1 Inhaler 12  . triamcinolone cream (KENALOG) 0.1 % APPLY  CREAM EXTERNALLY TO AFFECTED AREA TWICE DAILY AS NEEDED 80 g 1   No current facility-administered medications on file prior to visit.    Lab Results  Component Value Date   WBC 5.7 06/13/2018   HGB 14.5 06/13/2018   HCT 43.9 06/13/2018   PLT 140.0 (L) 06/13/2018   GLUCOSE 88 06/13/2018   CHOL 168 06/13/2018   TRIG 278.0 (H) 06/13/2018   HDL 72.70 06/13/2018   LDLDIRECT 74.0 06/13/2018   LDLCALC 57 08/16/2017   ALT 32 06/13/2018   AST 30 06/13/2018   NA 141 06/13/2018   K 4.2 06/13/2018   CL 107 06/13/2018   CREATININE 1.08 06/13/2018   BUN 18 06/13/2018   CO2 26 06/13/2018   TSH 2.08 06/13/2018   INR 1.1 11/12/2012   A/P/next steps:   Allergies -  with mod seasonal flare, for predpac asd,  to f/u any worsening symptoms or concerns  Asthma - with mild to mod seasonal flare, for predpac asd,  to f/u any worsening symptoms or concerns  Left knee pain - for volt gel prn, also referral to ortho near [place] per pt request  Cathlean Cower MD

## 2018-12-14 ENCOUNTER — Encounter: Payer: Self-pay | Admitting: Internal Medicine

## 2018-12-14 NOTE — Assessment & Plan Note (Signed)
See notes

## 2018-12-14 NOTE — Patient Instructions (Signed)
Please take all new medication as prescribed  Please continue all other medications as before, and refills have been done if requested.  Please have the pharmacy call with any other refills you may need.  Please continue your efforts at being more active, low cholesterol diet, and weight control.  Please keep your appointments with your specialists as you may have planned    

## 2018-12-14 NOTE — Assessment & Plan Note (Signed)
For volt gel, refer ortho

## 2019-02-03 ENCOUNTER — Other Ambulatory Visit: Payer: Self-pay | Admitting: Internal Medicine

## 2019-05-01 ENCOUNTER — Other Ambulatory Visit: Payer: Self-pay | Admitting: Infectious Diseases

## 2019-05-01 DIAGNOSIS — Z1231 Encounter for screening mammogram for malignant neoplasm of breast: Secondary | ICD-10-CM

## 2019-05-06 ENCOUNTER — Other Ambulatory Visit: Payer: Self-pay | Admitting: Internal Medicine

## 2019-05-21 ENCOUNTER — Ambulatory Visit
Admission: RE | Admit: 2019-05-21 | Discharge: 2019-05-21 | Disposition: A | Discharge status: Home / Self Care | Payer: BC Managed Care – PPO | Source: Ambulatory Visit | Attending: Infectious Diseases | Admitting: Infectious Diseases

## 2019-05-21 DIAGNOSIS — Z1231 Encounter for screening mammogram for malignant neoplasm of breast: Secondary | ICD-10-CM | POA: Diagnosis not present

## 2019-05-29 ENCOUNTER — Other Ambulatory Visit: Payer: Self-pay | Admitting: Internal Medicine

## 2019-05-30 ENCOUNTER — Other Ambulatory Visit: Payer: Self-pay | Admitting: Internal Medicine

## 2019-05-30 NOTE — Telephone Encounter (Signed)
Please refill as per office routine med refill policy (all routine meds refilled for 3 mo or monthly per pt preference up to one year from last visit, then month to month grace period for 3 mo, then further med refills will have to be denied)  

## 2019-08-25 ENCOUNTER — Other Ambulatory Visit: Payer: Self-pay | Admitting: Internal Medicine

## 2019-08-28 ENCOUNTER — Other Ambulatory Visit: Payer: Self-pay | Admitting: Internal Medicine

## 2019-08-28 NOTE — Telephone Encounter (Signed)
Please refill as per office routine med refill policy (all routine meds refilled for 3 mo or monthly per pt preference up to one year from last visit, then month to month grace period for 3 mo, then further med refills will have to be denied)  

## 2019-09-28 ENCOUNTER — Other Ambulatory Visit: Payer: Self-pay | Admitting: Internal Medicine

## 2019-09-28 NOTE — Telephone Encounter (Signed)
Please refill as per office routine med refill policy (all routine meds refilled for 3 mo or monthly per pt preference up to one year from last visit, then month to month grace period for 3 mo, then further med refills will have to be denied)  

## 2019-10-02 ENCOUNTER — Other Ambulatory Visit: Payer: Self-pay

## 2019-10-02 MED ORDER — ATORVASTATIN CALCIUM 20 MG PO TABS: 20.0000 mg | ORAL_TABLET | Freq: Every day | ORAL | 0 refills | Status: DC

## 2019-11-27 ENCOUNTER — Other Ambulatory Visit: Payer: Self-pay | Admitting: Internal Medicine

## 2019-12-01 ENCOUNTER — Other Ambulatory Visit: Payer: Self-pay

## 2019-12-01 MED ORDER — TRIAMCINOLONE ACETONIDE 0.1 % EX CREA: TOPICAL_CREAM | CUTANEOUS | 1 refills | Status: DC

## 2019-12-27 ENCOUNTER — Other Ambulatory Visit: Payer: Self-pay | Admitting: Internal Medicine

## 2019-12-27 NOTE — Telephone Encounter (Signed)
Please refill as per office routine med refill policy (all routine meds refilled for 3 mo or monthly per pt preference up to one year from last visit, then month to month grace period for 3 mo, then further med refills will have to be denied)  

## 2020-02-26 LAB — COLOGUARD

## 2020-04-27 LAB — COLOGUARD: COLOGUARD: NEGATIVE

## 2020-11-01 ENCOUNTER — Encounter: Payer: Self-pay | Admitting: Internal Medicine

## 2020-11-01 ENCOUNTER — Ambulatory Visit (INDEPENDENT_AMBULATORY_CARE_PROVIDER_SITE_OTHER): Payer: Medicare HMO | Admitting: Internal Medicine

## 2020-11-01 ENCOUNTER — Other Ambulatory Visit: Payer: Self-pay

## 2020-11-01 VITALS — BP 158/78 | HR 68 | Temp 98.8°F | Ht 65.0 in | Wt 270.6 lb

## 2020-11-01 DIAGNOSIS — E559 Vitamin D deficiency, unspecified: Secondary | ICD-10-CM | POA: Diagnosis not present

## 2020-11-01 DIAGNOSIS — I10 Essential (primary) hypertension: Secondary | ICD-10-CM | POA: Diagnosis not present

## 2020-11-01 DIAGNOSIS — E538 Deficiency of other specified B group vitamins: Secondary | ICD-10-CM

## 2020-11-01 DIAGNOSIS — Z720 Tobacco use: Secondary | ICD-10-CM | POA: Diagnosis not present

## 2020-11-01 DIAGNOSIS — E785 Hyperlipidemia, unspecified: Secondary | ICD-10-CM

## 2020-11-01 DIAGNOSIS — J4521 Mild intermittent asthma with (acute) exacerbation: Secondary | ICD-10-CM

## 2020-11-01 DIAGNOSIS — R739 Hyperglycemia, unspecified: Secondary | ICD-10-CM | POA: Diagnosis not present

## 2020-11-01 DIAGNOSIS — Z0001 Encounter for general adult medical examination with abnormal findings: Secondary | ICD-10-CM

## 2020-11-01 LAB — CBC WITH DIFFERENTIAL/PLATELET
Basophils Absolute: 0 10*3/uL (ref 0.0–0.1)
Basophils Relative: 0.6 % (ref 0.0–3.0)
Eosinophils Absolute: 0.4 10*3/uL (ref 0.0–0.7)
Eosinophils Relative: 6.1 % — ABNORMAL HIGH (ref 0.0–5.0)
HCT: 38.6 % (ref 36.0–46.0)
Hemoglobin: 12.2 g/dL (ref 12.0–15.0)
Lymphocytes Relative: 25.7 % (ref 12.0–46.0)
Lymphs Abs: 1.6 10*3/uL (ref 0.7–4.0)
MCHC: 31.5 g/dL (ref 30.0–36.0)
MCV: 83.5 fl (ref 78.0–100.0)
Monocytes Absolute: 0.6 10*3/uL (ref 0.1–1.0)
Monocytes Relative: 9.3 % (ref 3.0–12.0)
Neutro Abs: 3.7 10*3/uL (ref 1.4–7.7)
Neutrophils Relative %: 58.3 % (ref 43.0–77.0)
Platelets: 173 10*3/uL (ref 150.0–400.0)
RBC: 4.63 Mil/uL (ref 3.87–5.11)
RDW: 20.7 % — ABNORMAL HIGH (ref 11.5–15.5)
WBC: 6.4 10*3/uL (ref 4.0–10.5)

## 2020-11-01 LAB — URINALYSIS, ROUTINE W REFLEX MICROSCOPIC
Bilirubin Urine: NEGATIVE
Hgb urine dipstick: NEGATIVE
Leukocytes,Ua: NEGATIVE
Nitrite: NEGATIVE
Specific Gravity, Urine: 1.025 (ref 1.000–1.030)
Urine Glucose: NEGATIVE
Urobilinogen, UA: 0.2 (ref 0.0–1.0)
pH: 5.5 (ref 5.0–8.0)

## 2020-11-01 LAB — BASIC METABOLIC PANEL
BUN: 18 mg/dL (ref 6–23)
CO2: 28 mEq/L (ref 19–32)
Calcium: 9.1 mg/dL (ref 8.4–10.5)
Chloride: 103 mEq/L (ref 96–112)
Creatinine, Ser: 1.07 mg/dL (ref 0.40–1.20)
GFR: 52.13 mL/min — ABNORMAL LOW (ref >60.00)
Glucose, Bld: 90 mg/dL (ref 70–99)
Potassium: 4.6 mEq/L (ref 3.5–5.1)
Sodium: 140 mEq/L (ref 135–145)

## 2020-11-01 LAB — LIPID PANEL
Cholesterol: 142 mg/dL (ref 0–200)
HDL: 53.2 mg/dL (ref >39.00)
LDL Cholesterol: 63 mg/dL (ref 0–99)
NonHDL: 88.78
Total CHOL/HDL Ratio: 3
Triglycerides: 130 mg/dL (ref 0.0–149.0)
VLDL: 26 mg/dL (ref 0.0–40.0)

## 2020-11-01 LAB — VITAMIN D 25 HYDROXY (VIT D DEFICIENCY, FRACTURES): VITD: 42.91 ng/mL (ref 30.00–100.00)

## 2020-11-01 LAB — HEMOGLOBIN A1C: Hgb A1c MFr Bld: 5.1 % (ref 4.6–6.5)

## 2020-11-01 LAB — HEPATIC FUNCTION PANEL
ALT: 20 U/L (ref 0–35)
AST: 19 U/L (ref 0–37)
Albumin: 3.8 g/dL (ref 3.5–5.2)
Alkaline Phosphatase: 57 U/L (ref 39–117)
Bilirubin, Direct: 0.1 mg/dL (ref 0.0–0.3)
Total Bilirubin: 0.5 mg/dL (ref 0.2–1.2)
Total Protein: 6.8 g/dL (ref 6.0–8.3)

## 2020-11-01 LAB — VITAMIN B12: Vitamin B-12: 557 pg/mL (ref 211–911)

## 2020-11-01 LAB — TSH: TSH: 3.03 u[IU]/mL (ref 0.35–5.50)

## 2020-11-01 MED ORDER — PREDNISONE 10 MG PO TABS: ORAL_TABLET | ORAL | 0 refills | Status: DC

## 2020-11-01 NOTE — Assessment & Plan Note (Signed)
Age and sex appropriate education and counseling updated with regular exercise and diet Referrals for preventative services - declines LDCT referral/pulm referral Immunizations addressed - declines covid booster Smoking counseling  - counseled to quit, pt not ready Evidence for depression or other mood disorder - none significant Most recent labs reviewed. I have personally reviewed and have noted: 1) the patient's medical and social history 2) The patient's current medications and supplements 3) The patient's height, weight, and BMI have been recorded in the chart

## 2020-11-01 NOTE — Assessment & Plan Note (Signed)
Mild, for predpac asd, cont albuterol prn and advised to take flovent regularly (though does not due to cost)

## 2020-11-01 NOTE — Patient Instructions (Signed)
Please take all new medication as prescribed - the prednisone for the asthma  Please continue all other medications as before, and refills have been done if requested.  Please have the pharmacy call with any other refills you may need.  Please continue your efforts at being more active, low cholesterol diet, and weight control.  You are otherwise up to date with prevention measures today.  Please keep your appointments with your specialists as you may have planned  Please go to the LAB at the blood drawing area for the tests to be done  You will be contacted by phone if any changes need to be made immediately.  Otherwise, you will receive a letter about your results with an explanation, but please check with MyChart first.  Please remember to sign up for MyChart if you have not done so, as this will be important to you in the future with finding out test results, communicating by private email, and scheduling acute appointments online when needed.  Please make an Appointment to return in 6 months, or sooner if needed

## 2020-11-01 NOTE — Assessment & Plan Note (Signed)
Lab Results  Component Value Date   LDLCALC 63 11/01/2020   Stable, pt to continue current statin lipitor 20

## 2020-11-01 NOTE — Progress Notes (Signed)
Patient ID: Bethany Lopez, female   DOB: [birth year], 70 y.o.   MRN: [id number]         Chief Complaint:: wellness exam and uncontrolled HTN, HLD, asthma exacerbation, and smoking       HPI:  Bethany Lopez is a 70 y.o. female here for wellness exam; declines covid booster, o/w up to date with preventive referrals and immunizations, declines referral to LDCT program.                          Also did not take BP meds this am yet, trying to follow lower cholesterol diet, Pt denies chest pain, increased sob or doe, wheezing, orthopnea, PND, increased LE swelling, palpitations, dizziness or syncope, except for mild wheezing/sob/doe since 2 days not much improved with albuterol inhaler prn.  Still smoking, not ready to quit.   Pt denies polydipsia, polyuria, or new focal neuro s/s.   Pt denies fever, wt loss, night sweats, loss of appetite, or other constitutional symptoms    Wt Readings from Last 3 Encounters:  11/01/20 270 lb 9.6 oz (122.7 kg)  06/13/18 250 lb (113.4 kg)  08/16/17 244 lb (110.7 kg)   BP Readings from Last 3 Encounters:  11/01/20 (!) 158/78  06/13/18 138/90  08/16/17 134/84   Immunization History  Administered Date(s) Administered   Influenza, High Dose Seasonal PF 02/15/2017   Influenza,inj,Quad PF,6+ Mos 02/11/2015, 02/10/2016   Influenza,inj,quad, With Preservative 12/31/2018   Influenza-Unspecified 01/01/2014   PFIZER(Purple Top)SARS-COV-2 Vaccination 08/12/2019, 09/02/2019, 03/30/2020   Pneumococcal Conjugate-13 02/18/2015   Pneumococcal Polysaccharide-23 08/12/2015   Tdap 12/06/2011   Zoster Recombinat (Shingrix) 12/31/2018, 02/24/2019   Zoster, Live 04/30/2012   Health Maintenance Due  Topic Date Due   COVID-19 Vaccine (4 - Booster for [place] series) 07/29/2020      Past Medical History:  Diagnosis Date   Allergic rhinitis, cause unspecified 05/01/2012   Asthma    Carpal tunnel syndrome 06/17/2014   GERD (gastroesophageal reflux disease)  02/11/2015   Hyperlipidemia 05/30/2011   NEC/NOS     Hypertension    Obesity    Thrombocytopenia ([place]) 08/12/2015   Tobacco abuse    Past Surgical History:  Procedure Laterality Date   arthroscopic right knee     5-6 yrs ago   TUBAL LIGATION      reports that she has been smoking cigarettes. She has been smoking an average of .5 packs per day. She has never used smokeless tobacco. She reports current alcohol use. She reports that she does not use drugs. family history includes Diabetes in her brother, mother, and sister; Heart disease in her father; Kidney disease in her mother. Allergies  Allergen Reactions   Lodine [Etodolac] Other (See Comments)    GI upset   Penicillins    Current Outpatient Medications on File Prior to Visit  Medication Sig Dispense Refill   acetaminophen (TYLENOL) 500 MG tablet Take by mouth.     albuterol (PROVENTIL HFA;VENTOLIN HFA) 108 (90 Base) MCG/ACT inhaler Inhale 2 puffs into the lungs every 6 (six) hours as needed for wheezing or shortness of breath. 1 Inhaler 11   amLODipine (NORVASC) 10 MG tablet Take 1 tablet (10 mg total) by mouth daily. 90 tablet 3   aspirin 81 MG EC tablet Take by mouth.     atorvastatin (LIPITOR) 20 MG tablet TAKE 1 TABLET BY MOUTH ONCE DAILY . APPOINTMENT REQUIRED FOR FUTURE REFILLS 30 tablet 0   B Complex-C (  SUPER B COMPLEX PO) Take by mouth.     diclofenac sodium (VOLTAREN) 1 % GEL Apply 2 g topically 4 (four) times daily as needed. 100 g 5   famotidine (PEPCID) 40 MG tablet SMARTSIG:1 Tablet(s) By Mouth Every Evening     Ferrous Sulfate (IRON PO) Take by mouth.     fexofenadine (ALLEGRA) 180 MG tablet Take by mouth.     FLOVENT HFA 110 MCG/ACT inhaler SMARTSIG:2 Puff(s) By Mouth Twice Daily     fluticasone (FLONASE) 50 MCG/ACT nasal spray Place 2 sprays into both nostrils daily.     ibuprofen (ADVIL,MOTRIN) 800 MG tablet Take 1 tablet (800 mg total) by mouth every 8 (eight) hours as needed. 90 tablet 3   losartan (COZAAR)  50 MG tablet Take 1 tablet by mouth once daily 90 tablet 0   losartan (COZAAR) 50 MG tablet Take 2 tablets by mouth daily.     meloxicam (MOBIC) 15 MG tablet Take 15 mg by mouth daily.     Multiple Vitamin (MULTIVITAMIN) tablet Take 1 tablet by mouth daily.     NIFEdipine (ADALAT CC) 30 MG 24 hr tablet nifedipine ER 30 mg tablet,extended release     Omega-3 Fatty Acids (FISH OIL) 1000 MG CAPS Take 1 capsule by mouth daily.     triamcinolone (NASACORT) 55 MCG/ACT AERO nasal inhaler USE 2 SPRAY(S) INTRANASALLY ONCE DAILY 17 g 2   triamcinolone cream (KENALOG) 0.1 % APPLY  CREAM EXTERNALLY TO AFFECTED AREA TWICE DAILY AS NEEDED 80 g 1   triamcinolone cream (KENALOG) 0.1 % Apply topically.     VENTOLIN HFA 108 (90 Base) MCG/ACT inhaler INHALE 2 PUFFS BY MOUTH EVERY 6 HOURS AS NEEDED FOR WHEEZING FOR SHORTNESS OF BREATH 18 g 0   cetirizine (ZYRTEC) 10 MG tablet Take 1 tablet (10 mg total) by mouth daily. 90 tablet 3   No current facility-administered medications on file prior to visit.        ROS:  All others reviewed and negative.  Objective        PE:  BP (!) 158/78 (BP Location: Left Arm, Patient Position: Sitting, Cuff Size: Large)   Pulse 68   Temp 98.8 F (37.1 C) (Oral)   Ht 5\' 5"  (1.651 m)   Wt 270 lb 9.6 oz (122.7 kg)   SpO2 95%   BMI 45.03 kg/m                 Constitutional: Pt appears in NAD               HENT: Head: NCAT.                Right Ear: External ear normal.                 Left Ear: External ear normal.                Eyes: . Pupils are equal, round, and reactive to light. Conjunctivae and EOM are normal               Nose: without d/c or deformity               Neck: Neck supple. Gross normal ROM               Cardiovascular: Normal rate and regular rhythm.                 Pulmonary/Chest: Effort normal and breath sounds without rales or wheezing.  Abd:  Soft, NT, ND, + BS, no organomegaly               Neurological: Pt is alert. At baseline  orientation, motor grossly intact               Skin: Skin is warm. No rashes, no other new lesions, LE edema - none               Psychiatric: Pt behavior is normal without agitation   Micro: none  Cardiac tracings I have personally interpreted today:  none  Pertinent Radiological findings (summarize): none   Lab Results  Component Value Date   WBC 6.4 11/01/2020   HGB 12.2 11/01/2020   HCT 38.6 11/01/2020   PLT 173.0 11/01/2020   GLUCOSE 90 11/01/2020   CHOL 142 11/01/2020   TRIG 130.0 11/01/2020   HDL 53.20 11/01/2020   LDLDIRECT 74.0 06/13/2018   LDLCALC 63 11/01/2020   ALT 20 11/01/2020   AST 19 11/01/2020   NA 140 11/01/2020   K 4.6 11/01/2020   CL 103 11/01/2020   CREATININE 1.07 11/01/2020   BUN 18 11/01/2020   CO2 28 11/01/2020   TSH 3.03 11/01/2020   INR 1.1 11/12/2012   HGBA1C 5.1 11/01/2020   Assessment/Plan:  Bethany Lopez is a 70 y.o. Black or African American [2] female with  has a past medical history of Allergic rhinitis, cause unspecified (05/01/2012), Asthma, Carpal tunnel syndrome (06/17/2014), GERD (gastroesophageal reflux disease) (02/11/2015), Hyperlipidemia (05/30/2011), Hypertension, Obesity, Thrombocytopenia ([place]) (08/12/2015), and Tobacco abuse.  Encounter for preventative adult health care exam with abnormal findings Age and sex appropriate education and counseling updated with regular exercise and diet Referrals for preventative services - declines LDCT referral/pulm referral Immunizations addressed - declines covid booster Smoking counseling  - counseled to quit, pt not ready Evidence for depression or other mood disorder - none significant Most recent labs reviewed. I have personally reviewed and have noted: 1) the patient's medical and social history 2) The patient's current medications and supplements 3) The patient's height, weight, and BMI have been recorded in the chart   Tobacco use Counseled to quit, pt not  reaady  Hypertension Mild uncontrolled, likely reactive but also did not take meds this am, to restart and take all meds as rx  Hyperlipidemia Lab Results  Component Value Date   [place] 63 11/01/2020   Stable, pt to continue current statin lipitor 20   Asthma with acute exacerbation Mild, for predpac asd, cont albuterol prn and advised to take flovent regularly (though does not due to cost)  Followup: Return in about 6 months (around 05/04/2021).  Cathlean Cower, MD 11/01/2020 9:53 PM [facility]

## 2020-11-01 NOTE — Assessment & Plan Note (Signed)
Counseled to quit, pt not reaady

## 2020-11-01 NOTE — Assessment & Plan Note (Signed)
Mild uncontrolled, likely reactive but also did not take meds this am, to restart and take all meds as rx

## 2020-11-02 ENCOUNTER — Telehealth: Payer: Self-pay

## 2020-11-02 NOTE — Telephone Encounter (Signed)
Laverna Peace NP is excepting new patient ,but is currently on LOA, until September, would be October or November for first available.

## 2020-11-02 NOTE — Telephone Encounter (Signed)
Then just have them call and schedule [phone] thanks

## 2020-11-02 NOTE — Telephone Encounter (Signed)
We have a new NP, [employer], starting next week, but he will not be able to absorb any TOCs outside of [place] until later in the Fall.If the pt can wait until Oct or Nov he can also call [facility] at [phone] at that time.

## 2020-11-02 NOTE — Telephone Encounter (Signed)
Biagio Borg, MD  Lucinda Dell, RN Bethany Lopez   This patient and her husband saw me today as they were unsatisfied with their current PCP at a location closer to their home in [place].  They explained this was one time physicals, howevere, and would like to be transferred in care again closer to their home since it is too far to travel each time here.  They were asking for a [facility] provider at [facility] [address] or [facility].  I am at a loss here,  Is this something youcanhelp with or how to direct them?  thanks

## 2020-11-04 NOTE — Telephone Encounter (Signed)
Pt notified that appt made for 10/25 at 0900.  Office number to that location given in case patient needed to make any changes.  Pt verb understanding.

## 2020-11-04 NOTE — Telephone Encounter (Signed)
Pt states she would prefer the [facility] location.  Explained to her who the provider was & that I would schedule a TOC appt for her & her husband & inform her of that date/time.  Pt verb understanding.

## 2020-11-23 ENCOUNTER — Other Ambulatory Visit: Payer: Self-pay | Admitting: Internal Medicine

## 2020-11-23 NOTE — Telephone Encounter (Signed)
Please refill as per office routine med refill policy (all routine meds refilled for 3 mo or monthly per pt preference up to one year from last visit, then month to month grace period for 3 mo, then further med refills will have to be denied)  

## 2020-11-24 ENCOUNTER — Other Ambulatory Visit: Payer: Self-pay | Admitting: Internal Medicine

## 2020-11-24 NOTE — Telephone Encounter (Signed)
Please refill as per office routine med refill policy (all routine meds refilled for 3 mo or monthly per pt preference up to one year from last visit, then month to month grace period for 3 mo, then further med refills will have to be denied)  

## 2020-11-25 ENCOUNTER — Other Ambulatory Visit: Payer: Self-pay | Admitting: Internal Medicine

## 2020-11-25 NOTE — Telephone Encounter (Signed)
Please refill as per office routine med refill policy (all routine meds refilled for 3 mo or monthly per pt preference up to one year from last visit, then month to month grace period for 3 mo, then further med refills will have to be denied)  

## 2020-11-26 ENCOUNTER — Encounter: Payer: Self-pay | Admitting: Gastroenterology

## 2020-12-13 ENCOUNTER — Telehealth: Payer: Self-pay | Admitting: Internal Medicine

## 2020-12-13 NOTE — Telephone Encounter (Signed)
Patient took COVID test & was negative 08.28.22  Patient says she is coughing up mucus w/ some color to it... which she believes she has some type of sinus infection  Wants to know if provider can send over antibiotic to pharmacy or if she needs to be seen  Please call [phone]

## 2020-12-14 NOTE — Telephone Encounter (Signed)
Patient needs appt. Spoke with patient and she states that she will call back to confirm when she can come in.

## 2020-12-24 ENCOUNTER — Other Ambulatory Visit: Payer: Self-pay | Admitting: Internal Medicine

## 2020-12-24 NOTE — Telephone Encounter (Signed)
Please refill as per office routine med refill policy (all routine meds to be refilled for 3 mo or monthly (per pt preference) up to one year from last visit, then month to month grace period for 3 mo, then further med refills will have to be denied) ? ?

## 2020-12-27 NOTE — Telephone Encounter (Signed)
Patient calling to check status of refill request bc pharmacy advised patient they never got approval for refill  Please confirm w/ pharmacy refill has been submitted.. patient says she is out of medication  Pharmacy: [facility] [address], [place]  Phone:  [phone] Fax:  [phone]

## 2021-01-28 ENCOUNTER — Telehealth: Payer: Self-pay | Admitting: Internal Medicine

## 2021-01-28 NOTE — Telephone Encounter (Signed)
Please refill as per office routine med refill policy (all routine meds to be refilled for 3 mo or monthly (per pt preference) up to one year from last visit, then month to month grace period for 3 mo, then further med refills will have to be denied) ? ?

## 2021-01-31 MED ORDER — LOSARTAN POTASSIUM 50 MG PO TABS: 100.0000 mg | ORAL_TABLET | Freq: Every day | ORAL | 2 refills | Status: DC

## 2021-01-31 NOTE — Telephone Encounter (Signed)
Patient checking status of Atorvastatin 20 mg; last filled on 12/29/20; and Losartan 50 mg   Advised patient refill for Atorvastatin was ordered on 01-28-2021.   Patient states she is still waiting on Losartan 50 mg   Please advise

## 2021-01-31 NOTE — Telephone Encounter (Signed)
Rx sent to pharmacy and patient notified.   

## 2021-01-31 NOTE — Telephone Encounter (Signed)
[facility] FYI 01/29/2021  Caller state med bottle says she has to see MD for refills. Saw Dr in July and was told she would have a 6 month supply. Had it last refilled in September 12/29/20. Caller only has one pill left. [place]. [phone]). [phone] Has follow up appt in Jan for her 6 month follow up. States she just needs medicine refilled. No new or worsening symptoms. Atorvastatin 20 mg; last filled on 12/29/20; and Losartan 50 mg will also need refills authorized per pharmacist. Pharmacist confirmed she received a new Rxs on Sept 13th Rx sent in for 30 days and before that Aug 9th for 30 days only.

## 2021-01-31 NOTE — Addendum Note (Signed)
Addended by: Terence Lux A on: 01/31/2021 11:24 AM   Modules accepted: Orders

## 2021-02-08 ENCOUNTER — Ambulatory Visit: Payer: Medicare HMO | Admitting: Adult Health

## 2021-03-01 ENCOUNTER — Other Ambulatory Visit: Payer: Self-pay | Admitting: Internal Medicine

## 2021-05-03 ENCOUNTER — Encounter: Payer: Self-pay | Admitting: Internal Medicine

## 2021-05-03 ENCOUNTER — Other Ambulatory Visit: Payer: Self-pay

## 2021-05-03 ENCOUNTER — Ambulatory Visit (INDEPENDENT_AMBULATORY_CARE_PROVIDER_SITE_OTHER): Payer: Medicare HMO | Admitting: Internal Medicine

## 2021-05-03 VITALS — BP 140/80 | HR 90 | Temp 98.3°F | Ht 65.0 in | Wt 274.0 lb

## 2021-05-03 DIAGNOSIS — D649 Anemia, unspecified: Secondary | ICD-10-CM

## 2021-05-03 DIAGNOSIS — Z0001 Encounter for general adult medical examination with abnormal findings: Secondary | ICD-10-CM | POA: Diagnosis not present

## 2021-05-03 DIAGNOSIS — E7800 Pure hypercholesterolemia, unspecified: Secondary | ICD-10-CM | POA: Diagnosis not present

## 2021-05-03 DIAGNOSIS — E538 Deficiency of other specified B group vitamins: Secondary | ICD-10-CM

## 2021-05-03 DIAGNOSIS — J4521 Mild intermittent asthma with (acute) exacerbation: Secondary | ICD-10-CM

## 2021-05-03 DIAGNOSIS — Z720 Tobacco use: Secondary | ICD-10-CM

## 2021-05-03 DIAGNOSIS — J309 Allergic rhinitis, unspecified: Secondary | ICD-10-CM | POA: Diagnosis not present

## 2021-05-03 DIAGNOSIS — N289 Disorder of kidney and ureter, unspecified: Secondary | ICD-10-CM

## 2021-05-03 DIAGNOSIS — R739 Hyperglycemia, unspecified: Secondary | ICD-10-CM | POA: Diagnosis not present

## 2021-05-03 DIAGNOSIS — E559 Vitamin D deficiency, unspecified: Secondary | ICD-10-CM | POA: Diagnosis not present

## 2021-05-03 DIAGNOSIS — I10 Essential (primary) hypertension: Secondary | ICD-10-CM

## 2021-05-03 LAB — LIPID PANEL
Cholesterol: 156 mg/dL (ref 0–200)
HDL: 43.6 mg/dL (ref >39.00)
LDL Cholesterol: 80 mg/dL (ref 0–99)
NonHDL: 112.54
Total CHOL/HDL Ratio: 4
Triglycerides: 161 mg/dL — ABNORMAL HIGH (ref 0.0–149.0)
VLDL: 32.2 mg/dL (ref 0.0–40.0)

## 2021-05-03 LAB — BASIC METABOLIC PANEL
BUN: 11 mg/dL (ref 6–23)
CO2: 26 mEq/L (ref 19–32)
Calcium: 9.2 mg/dL (ref 8.4–10.5)
Chloride: 103 mEq/L (ref 96–112)
Creatinine, Ser: 0.94 mg/dL (ref 0.40–1.20)
GFR: 60.69 mL/min (ref >60.00)
Glucose, Bld: 97 mg/dL (ref 70–99)
Potassium: 4.3 mEq/L (ref 3.5–5.1)
Sodium: 137 mEq/L (ref 135–145)

## 2021-05-03 LAB — URINALYSIS, ROUTINE W REFLEX MICROSCOPIC
Bilirubin Urine: NEGATIVE
Hgb urine dipstick: NEGATIVE
Ketones, ur: NEGATIVE
Leukocytes,Ua: NEGATIVE
Nitrite: NEGATIVE
RBC / HPF: NONE SEEN (ref 0–?)
Specific Gravity, Urine: <=1.005 — AB (ref 1.000–1.030)
Total Protein, Urine: NEGATIVE
Urine Glucose: NEGATIVE
Urobilinogen, UA: 0.2 (ref 0.0–1.0)
WBC, UA: NONE SEEN (ref 0–?)
pH: 7 (ref 5.0–8.0)

## 2021-05-03 LAB — CBC WITH DIFFERENTIAL/PLATELET
Basophils Absolute: 0.1 10*3/uL (ref 0.0–0.1)
Basophils Relative: 1 % (ref 0.0–3.0)
Eosinophils Absolute: 0.3 10*3/uL (ref 0.0–0.7)
Eosinophils Relative: 4.8 % (ref 0.0–5.0)
HCT: 31.2 % — ABNORMAL LOW (ref 36.0–46.0)
Hemoglobin: 9.7 g/dL — ABNORMAL LOW (ref 12.0–15.0)
Lymphocytes Relative: 24.3 % (ref 12.0–46.0)
Lymphs Abs: 1.6 10*3/uL (ref 0.7–4.0)
MCHC: 31 g/dL (ref 30.0–36.0)
MCV: 81.6 fl (ref 78.0–100.0)
Monocytes Absolute: 0.6 10*3/uL (ref 0.1–1.0)
Monocytes Relative: 9.2 % (ref 3.0–12.0)
Neutro Abs: 3.9 10*3/uL (ref 1.4–7.7)
Neutrophils Relative %: 60.7 % (ref 43.0–77.0)
Platelets: 112 10*3/uL — ABNORMAL LOW (ref 150.0–400.0)
RBC: 3.83 Mil/uL — ABNORMAL LOW (ref 3.87–5.11)
RDW: 19.2 % — ABNORMAL HIGH (ref 11.5–15.5)
WBC: 6.4 10*3/uL (ref 4.0–10.5)

## 2021-05-03 LAB — TSH: TSH: 2.64 u[IU]/mL (ref 0.35–5.50)

## 2021-05-03 LAB — HEPATIC FUNCTION PANEL
ALT: 25 U/L (ref 0–35)
AST: 29 U/L (ref 0–37)
Albumin: 3.5 g/dL (ref 3.5–5.2)
Alkaline Phosphatase: 61 U/L (ref 39–117)
Bilirubin, Direct: 0.1 mg/dL (ref 0.0–0.3)
Total Bilirubin: 0.5 mg/dL (ref 0.2–1.2)
Total Protein: 6.8 g/dL (ref 6.0–8.3)

## 2021-05-03 LAB — VITAMIN B12: Vitamin B-12: 624 pg/mL (ref 211–911)

## 2021-05-03 LAB — HEMOGLOBIN A1C: Hgb A1c MFr Bld: 5.7 % (ref 4.6–6.5)

## 2021-05-03 LAB — VITAMIN D 25 HYDROXY (VIT D DEFICIENCY, FRACTURES): VITD: 42.77 ng/mL (ref 30.00–100.00)

## 2021-05-03 MED ORDER — PREDNISONE 10 MG PO TABS: ORAL_TABLET | ORAL | 0 refills | Status: DC

## 2021-05-03 MED ORDER — METHYLPREDNISOLONE ACETATE 80 MG/ML IJ SUSP
80.0000 mg | Freq: Once | INTRAMUSCULAR | Status: AC
  Administered 2021-05-03: 80 mg via INTRAMUSCULAR

## 2021-05-03 NOTE — Patient Instructions (Addendum)
You had the steroid shot today  Please take all new medication as prescribed  - the prednisone  Please continue all other medications as before, and refills have been done if requested.  Please have the pharmacy call with any other refills you may need.  Please continue your efforts at being more active, low cholesterol diet, and weight control.  You are otherwise up to date with prevention measures today.  Please keep your appointments with your specialists as you may have planned  Please go to the LAB at the blood drawing area for the tests to be done  You will be contacted by phone if any changes need to be made immediately.  Otherwise, you will receive a letter about your results with an explanation, but please check with MyChart first.  Please remember to sign up for MyChart if you have not done so, as this will be important to you in the future with finding out test results, communicating by private email, and scheduling acute appointments online when needed.  Please return in 6 months If you are not being seen at [facility]

## 2021-05-03 NOTE — Progress Notes (Signed)
Patient ID: Bethany Lopez, female   DOB: [birth year], 71 y.o.   MRN: [id number]         Chief Complaint:: wellness exam and Follow-up  New onset anemia, renal insufficiency, htn, smoker, allergies, asthma exacerbation       HPI:  Bethany Lopez is a 71 y.o. female here for wellness exam; declines covid booster, colonoscopy, o/w up to date                        Also Does have several wks ongoing nasal allergy symptoms with clearish congestion, itch and sneezing, without fever, pain, ST, cough, swelling or wheezing except for mild non prod cough, wheezing, sob x 2 days.  Still smoking, not ready to quit.  BP has been ok at home.  Last labs with mild renal insufficiency, new for her, today for f/u.  Pt denies chest pain, orthopnea, PND, increased LE swelling, palpitations, dizziness or syncope.  Pt denies new neurological symptoms such as new headache, or facial or extremity weakness or numbness   Pt denies polydipsia, polyuria, or new focal neuro s/s.   Pt denies fever, wt loss, night sweats, loss of appetite, or other constitutional symptoms  no recent overt bleeding.  She and husband Bethany Lopez may seek change of PCP to [facility] office as closer to their home Wt Readings from Last 3 Encounters:  05/03/21 274 lb (124.3 kg)  11/01/20 270 lb 9.6 oz (122.7 kg)  06/13/18 250 lb (113.4 kg)   BP Readings from Last 3 Encounters:  05/03/21 140/80  11/01/20 (!) 158/78  06/13/18 138/90   Immunization History  Administered Date(s) Administered   Influenza, High Dose Seasonal PF 02/15/2017   Influenza,inj,Quad PF,6+ Mos 02/11/2015, 02/10/2016, 02/05/2020   Influenza,inj,quad, With Preservative 12/31/2018   Influenza-Unspecified 01/01/2014, 02/11/2015, 02/10/2016, 12/28/2020   PFIZER Comirnaty(Gray Top)Covid-19 Tri-Sucrose Vaccine 08/12/2019, 09/02/2019   PFIZER(Purple Top)SARS-COV-2 Vaccination 03/30/2020   Pneumococcal Conjugate-13 02/18/2015   Pneumococcal Polysaccharide-23  08/12/2015   Tdap 12/06/2011   Zoster Recombinat (Shingrix) 12/31/2018, 02/24/2019   Zoster, Live 04/30/2012   There are no preventive care reminders to display for this patient.     Past Medical History:  Diagnosis Date   Allergic rhinitis, cause unspecified 05/01/2012   Asthma    Carpal tunnel syndrome 06/17/2014   GERD (gastroesophageal reflux disease) 02/11/2015   Hyperlipidemia 05/30/2011   NEC/NOS     Hypertension    Obesity    Thrombocytopenia ([place]) 08/12/2015   Tobacco abuse    Past Surgical History:  Procedure Laterality Date   arthroscopic right knee     5-6 yrs ago   TUBAL LIGATION      reports that she has been smoking cigarettes. She has been smoking an average of .5 packs per day. She has never used smokeless tobacco. She reports current alcohol use. She reports that she does not use drugs. family history includes Diabetes in her brother, mother, and sister; Heart disease in her father; Kidney disease in her mother. Allergies  Allergen Reactions   Lodine [Etodolac] Other (See Comments)    GI upset   Penicillins    Current Outpatient Medications on File Prior to Visit  Medication Sig Dispense Refill   acetaminophen (TYLENOL) 500 MG tablet Take by mouth.     albuterol (PROVENTIL HFA;VENTOLIN HFA) 108 (90 Base) MCG/ACT inhaler Inhale 2 puffs into the lungs every 6 (six) hours as needed for wheezing or shortness of breath. 1 Inhaler 11  amLODipine (NORVASC) 10 MG tablet Take 1 tablet (10 mg total) by mouth daily. 90 tablet 3   aspirin 81 MG EC tablet Take by mouth.     atorvastatin (LIPITOR) 20 MG tablet TAKE 1 TABLET BY MOUTH ONCE DAILY . APPOINTMENT REQUIRED FOR FUTURE REFILLS 90 tablet 1   B Complex-C (SUPER B COMPLEX PO) Take by mouth.     diclofenac sodium (VOLTAREN) 1 % GEL Apply 2 g topically 4 (four) times daily as needed. 100 g 5   famotidine (PEPCID) 40 MG tablet SMARTSIG:1 Tablet(s) By Mouth Every Evening     Ferrous Sulfate (IRON PO) Take by mouth.      fexofenadine (ALLEGRA) 180 MG tablet Take by mouth.     FLOVENT HFA 110 MCG/ACT inhaler SMARTSIG:2 Puff(s) By Mouth Twice Daily     fluticasone (FLONASE) 50 MCG/ACT nasal spray Place 2 sprays into both nostrils daily.     ibuprofen (ADVIL,MOTRIN) 800 MG tablet Take 1 tablet (800 mg total) by mouth every 8 (eight) hours as needed. 90 tablet 3   losartan (COZAAR) 50 MG tablet Take 1 tablet by mouth once daily 90 tablet 0   meloxicam (MOBIC) 15 MG tablet Take 15 mg by mouth daily.     Multiple Vitamin (MULTIVITAMIN) tablet Take 1 tablet by mouth daily.     NIFEdipine (ADALAT CC) 30 MG 24 hr tablet nifedipine ER 30 mg tablet,extended release     Omega-3 Fatty Acids (FISH OIL) 1000 MG CAPS Take 1 capsule by mouth daily.     triamcinolone (NASACORT) 55 MCG/ACT AERO nasal inhaler USE 2 SPRAY(S) INTRANASALLY ONCE DAILY 17 g 2   triamcinolone cream (KENALOG) 0.1 % APPLY  CREAM EXTERNALLY TO AFFECTED AREA TWICE DAILY AS NEEDED 80 g 1   triamcinolone cream (KENALOG) 0.1 % Apply topically.     VENTOLIN HFA 108 (90 Base) MCG/ACT inhaler INHALE 2 PUFFS BY MOUTH EVERY 6 HOURS AS NEEDED FOR WHEEZING FOR SHORTNESS OF BREATH 18 g 0   cetirizine (ZYRTEC) 10 MG tablet Take 1 tablet (10 mg total) by mouth daily. 90 tablet 3   losartan (COZAAR) 50 MG tablet Take 2 tablets (100 mg total) by mouth daily. (Patient not taking: Reported on 05/03/2021) 180 tablet 2   No current facility-administered medications on file prior to visit.        ROS:  All others reviewed and negative.  Objective        PE:  BP 140/80 (BP Location: Left Arm, Patient Position: Sitting, Cuff Size: Large)    Pulse 90    Temp 98.3 F (36.8 C) (Oral)    Ht 5\' 5"  (1.651 m)    Wt 274 lb (124.3 kg)    SpO2 95%    BMI 45.60 kg/m                 Constitutional: Pt appears in NAD               HENT: Head: NCAT.                Right Ear: External ear normal.                 Left Ear: External ear normal. Bilat tm's with mild erythema.  Max sinus  areas non tender.  Pharynx with mild erythema, no exudate               Eyes: . Pupils are equal, round, and reactive to light. Conjunctivae and EOM are  normal               Nose: without d/c or deformity               Neck: Neck supple. Gross normal ROM               Cardiovascular: Normal rate and regular rhythm.                 Pulmonary/Chest: Effort normal and breath sounds without rales but with mild biilateral wheezing.                Abd:  Soft, NT, ND, + BS, no organomegaly               Neurological: Pt is alert. At baseline orientation, motor grossly intact               Skin: Skin is warm. No rashes, no other new lesions, LE edema - none               Psychiatric: Pt behavior is normal without agitation   Micro: none  Cardiac tracings I have personally interpreted today:  none  Pertinent Radiological findings (summarize): none   Lab Results  Component Value Date   WBC 9.0 05/06/2021   HGB 9.8 (L) 05/06/2021   HCT 32.0 (L) 05/06/2021   PLT 131.0 (L) 05/06/2021   GLUCOSE 97 05/03/2021   CHOL 156 05/03/2021   TRIG 161.0 (H) 05/03/2021   HDL 43.60 05/03/2021   LDLDIRECT 74.0 06/13/2018   LDLCALC 80 05/03/2021   ALT 25 05/03/2021   AST 29 05/03/2021   NA 137 05/03/2021   K 4.3 05/03/2021   CL 103 05/03/2021   CREATININE 0.94 05/03/2021   BUN 11 05/03/2021   CO2 26 05/03/2021   TSH 2.64 05/03/2021   INR 1.1 11/12/2012   HGBA1C 5.7 05/03/2021   Assessment/Plan:  Bethany Lopez is a 71 y.o. Black or African American [2] female with  has a past medical history of Allergic rhinitis, cause unspecified (05/01/2012), Asthma, Carpal tunnel syndrome (06/17/2014), GERD (gastroesophageal reflux disease) (02/11/2015), Hyperlipidemia (05/30/2011), Hypertension, Obesity, Thrombocytopenia ([place]) (08/12/2015), and Tobacco abuse.  Encounter for preventative adult health care exam with abnormal findings Age and sex appropriate education and counseling updated with regular exercise  and diet Referrals for preventative services - declines colonsocopy Immunizations addressed - declines covid booster Smoking counseling  - none needed Evidence for depression or other mood disorder - none significant Most recent labs reviewed. I have personally reviewed and have noted: 1) the patient's medical and social history 2) The patient's current medications and supplements 3) The patient's height, weight, and BMI have been recorded in the chart  Allergic rhinitis  Mod flare etiology unclear, for depomedrol im 80,  to f/u any worsening symptoms or concerns  Asthma with acute exacerbation  Mod flare etiology unclear, for depomedrol im 80, prednisone asd,  to f/u any worsening symptoms or concerns  Hyperlipidemia Lab Results  Component Value Date   [place] 80 05/03/2021   Mild uncontrolled, goal ldl < 70 ,pt to continue current statin lipitor 20 as declines change for now, for lower chol diet   Hypertension BP Readings from Last 3 Encounters:  05/03/21 140/80  11/01/20 (!) 158/78  06/13/18 138/90   Stable, pt to continue medical treatment norvasc, losartan   Renal insufficiency Lab Results  Component Value Date   CREATININE 0.94 05/03/2021   GFR just under 60 at last visit, now  back to 60, Stable overall, cont to avoid nephrotoxins, no other change at this time   Tobacco use Pt counseled to quit, pt not ready  Anemia, unspecified With recent sudden worsening, for iron studies, consider refer back to GI after extensive evaluation 2014  Followup: Return in about 6 months (around 10/31/2021).  Cathlean Cower, MD 05/08/2021 11:56 AM [facility]

## 2021-05-04 ENCOUNTER — Encounter: Payer: Self-pay | Admitting: Internal Medicine

## 2021-05-04 ENCOUNTER — Other Ambulatory Visit: Payer: Self-pay | Admitting: Internal Medicine

## 2021-05-04 DIAGNOSIS — D649 Anemia, unspecified: Secondary | ICD-10-CM

## 2021-05-06 ENCOUNTER — Other Ambulatory Visit (INDEPENDENT_AMBULATORY_CARE_PROVIDER_SITE_OTHER): Payer: Medicare HMO

## 2021-05-06 DIAGNOSIS — D649 Anemia, unspecified: Secondary | ICD-10-CM | POA: Diagnosis not present

## 2021-05-06 LAB — CBC WITH DIFFERENTIAL/PLATELET
Basophils Absolute: 0.1 10*3/uL (ref 0.0–0.1)
Basophils Relative: 0.9 % (ref 0.0–3.0)
Eosinophils Absolute: 0.2 10*3/uL (ref 0.0–0.7)
Eosinophils Relative: 1.7 % (ref 0.0–5.0)
HCT: 32 % — ABNORMAL LOW (ref 36.0–46.0)
Hemoglobin: 9.8 g/dL — ABNORMAL LOW (ref 12.0–15.0)
Lymphocytes Relative: 28.4 % (ref 12.0–46.0)
Lymphs Abs: 2.5 10*3/uL (ref 0.7–4.0)
MCHC: 30.5 g/dL (ref 30.0–36.0)
MCV: 83.5 fl (ref 78.0–100.0)
Monocytes Absolute: 1.1 10*3/uL — ABNORMAL HIGH (ref 0.1–1.0)
Monocytes Relative: 11.9 % (ref 3.0–12.0)
Neutro Abs: 5.1 10*3/uL (ref 1.4–7.7)
Neutrophils Relative %: 57.1 % (ref 43.0–77.0)
Platelets: 131 10*3/uL — ABNORMAL LOW (ref 150.0–400.0)
RBC: 3.83 Mil/uL — ABNORMAL LOW (ref 3.87–5.11)
RDW: 19.1 % — ABNORMAL HIGH (ref 11.5–15.5)
WBC: 9 10*3/uL (ref 4.0–10.5)

## 2021-05-06 LAB — RETICULOCYTES
ABS Retic: 154570 cells/uL — ABNORMAL HIGH (ref 20000–80000)
Retic Ct Pct: 4.1 %

## 2021-05-06 LAB — IBC PANEL
Iron: 21 ug/dL — ABNORMAL LOW (ref 42–145)
Saturation Ratios: 4.6 % — ABNORMAL LOW (ref 20.0–50.0)
TIBC: 459.2 ug/dL — ABNORMAL HIGH (ref 250.0–450.0)
Transferrin: 328 mg/dL (ref 212.0–360.0)

## 2021-05-06 LAB — FERRITIN: Ferritin: 13.9 ng/mL (ref 10.0–291.0)

## 2021-05-06 NOTE — Addendum Note (Signed)
Addended by: Azzie Almas on: 05/06/2021 12:10 PM   Modules accepted: Orders

## 2021-05-08 ENCOUNTER — Other Ambulatory Visit: Payer: Self-pay | Admitting: Internal Medicine

## 2021-05-08 ENCOUNTER — Encounter: Payer: Self-pay | Admitting: Internal Medicine

## 2021-05-08 DIAGNOSIS — D509 Iron deficiency anemia, unspecified: Secondary | ICD-10-CM

## 2021-05-08 MED ORDER — POLYSACCHARIDE IRON COMPLEX 150 MG PO CAPS: 150.0000 mg | ORAL_CAPSULE | Freq: Every day | ORAL | 1 refills | Status: DC

## 2021-05-08 NOTE — Assessment & Plan Note (Signed)
Mod flare etiology unclear, for depomedrol im 80, prednisone asd,  to f/u any worsening symptoms or concerns

## 2021-05-08 NOTE — Assessment & Plan Note (Signed)
Age and sex appropriate education and counseling updated with regular exercise and diet Referrals for preventative services - declines colonsocopy Immunizations addressed - declines covid booster Smoking counseling  - none needed Evidence for depression or other mood disorder - none significant Most recent labs reviewed. I have personally reviewed and have noted: 1) the patient's medical and social history 2) The patient's current medications and supplements 3) The patient's height, weight, and BMI have been recorded in the chart

## 2021-05-08 NOTE — Assessment & Plan Note (Signed)
Lab Results  Component Value Date   CREATININE 0.94 05/03/2021   GFR just under 60 at last visit, now back to 60, Stable overall, cont to avoid nephrotoxins, no other change at this time

## 2021-05-08 NOTE — Assessment & Plan Note (Signed)
Mod flare etiology unclear, for depomedrol im 80,  to f/u any worsening symptoms or concerns

## 2021-05-08 NOTE — Assessment & Plan Note (Signed)
Pt counseled to quit, pt not ready 

## 2021-05-08 NOTE — Assessment & Plan Note (Signed)
BP Readings from Last 3 Encounters:  05/03/21 140/80  11/01/20 (!) 158/78  06/13/18 138/90   Stable, pt to continue medical treatment norvasc, losartan

## 2021-05-08 NOTE — Assessment & Plan Note (Signed)
With recent sudden worsening, for iron studies, consider refer back to GI after extensive evaluation 2014

## 2021-05-08 NOTE — Assessment & Plan Note (Signed)
Lab Results  Component Value Date   LDLCALC 80 05/03/2021   Mild uncontrolled, goal ldl < 70 ,pt to continue current statin lipitor 20 as declines change for now, for lower chol diet

## 2021-05-09 ENCOUNTER — Telehealth: Payer: Self-pay

## 2021-05-09 NOTE — Telephone Encounter (Signed)
CALLED PATIENT NO ANSWER LEFT VOICEMAIL FOR A CALL BACK ? ?

## 2021-05-09 NOTE — Telephone Encounter (Signed)
Pt returned the call for results. I advised pt of Dr. Judi Cong recommendation that a GI referral was placed. Pt wants that to be sent to [facility]. Also let her know that everything is stable.  Pt states that the antibiotic has worked great. Coughing has stopped. Pt also states that she is taking iron pills.   Pt states that she believes the amenia is coming from the bleeding in her throat associated with scar tissue in her esophagus from coughing.Marland Kitchen  Pt CB [phone]  FYI

## 2021-05-10 ENCOUNTER — Ambulatory Visit: Payer: Medicare HMO | Admitting: Internal Medicine

## 2021-05-17 NOTE — Telephone Encounter (Signed)
Pt states she is still wheezing, pt requesting a rx for predniSONE (DELTASONE) 10 MG tablet  Pt states provider prescribed medication on 05-03-2021 ov  Offered pt appt, pt declined

## 2021-05-18 MED ORDER — PREDNISONE 10 MG PO TABS: ORAL_TABLET | ORAL | 0 refills | Status: DC

## 2021-05-18 NOTE — Telephone Encounter (Signed)
Ok this is done 

## 2021-05-18 NOTE — Telephone Encounter (Signed)
Patient states that she is better but everything has not completely cleared. Finished Prednisone last week and requesting another prescription. Patient declines appointment.

## 2021-05-18 NOTE — Telephone Encounter (Signed)
Patient notified that prescription sent

## 2021-05-18 NOTE — Telephone Encounter (Signed)
Left message for patient to call me back. 

## 2021-07-12 ENCOUNTER — Ambulatory Visit: Payer: Medicare HMO | Admitting: Gastroenterology

## 2021-08-12 IMAGING — MG DIGITAL SCREENING BILAT W/ TOMO W/ CAD
8 of 15 series · 8 of 40 positions shown · non-contrast
Comparison: Previous exam(s).

ACR Breast Density Category a: The breast tissue is almost entirely
fatty.

CLINICAL DATA: Screening.

EXAM:
DIGITAL SCREENING BILATERAL MAMMOGRAM WITH TOMO AND CAD

[R MLO synth-2D (1 of 2)]
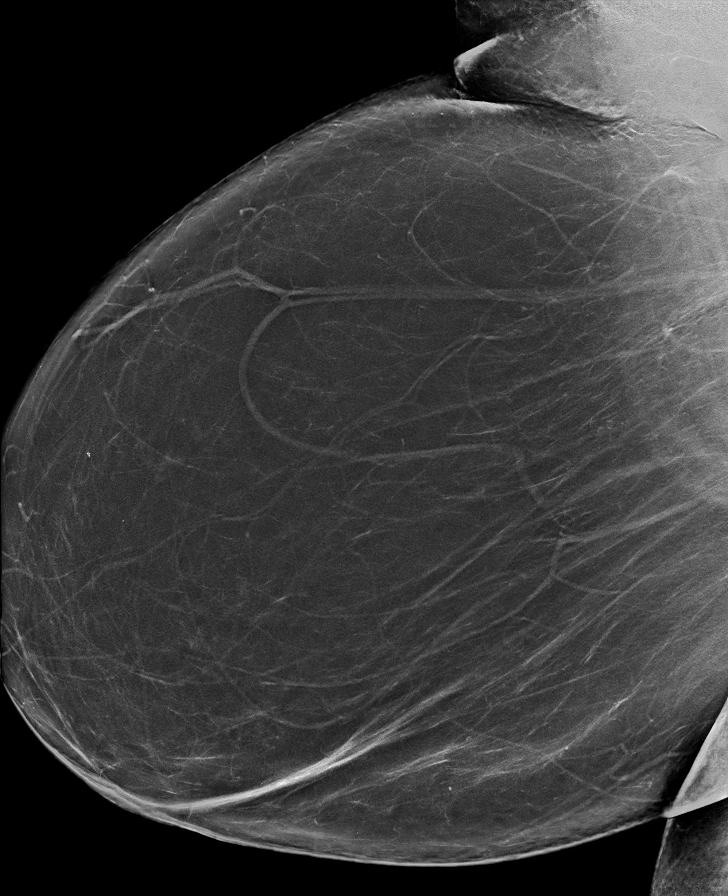

[L MLO synth-2D]
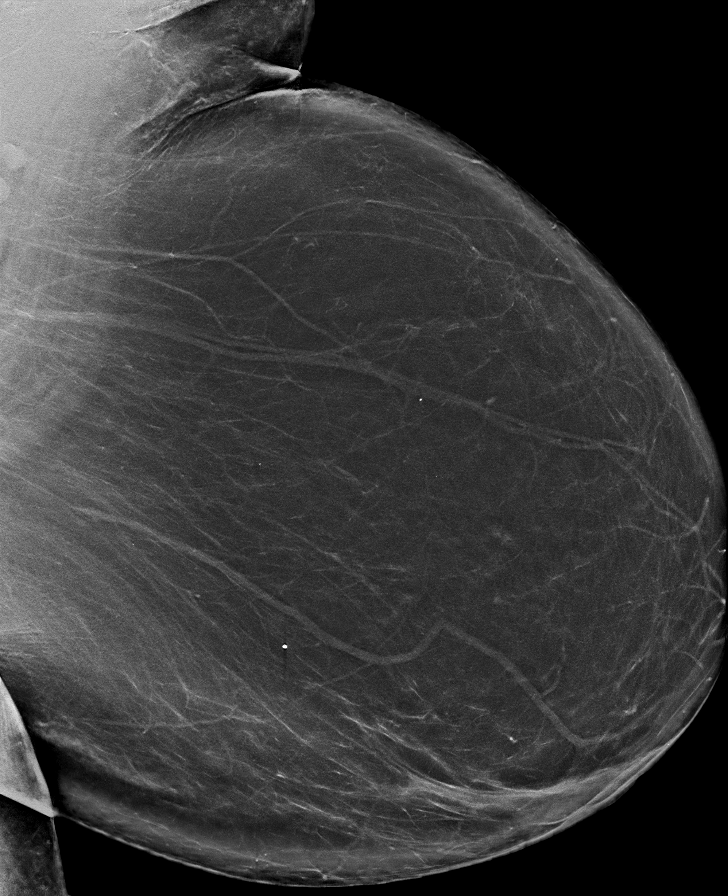

[R CC synth-2D]
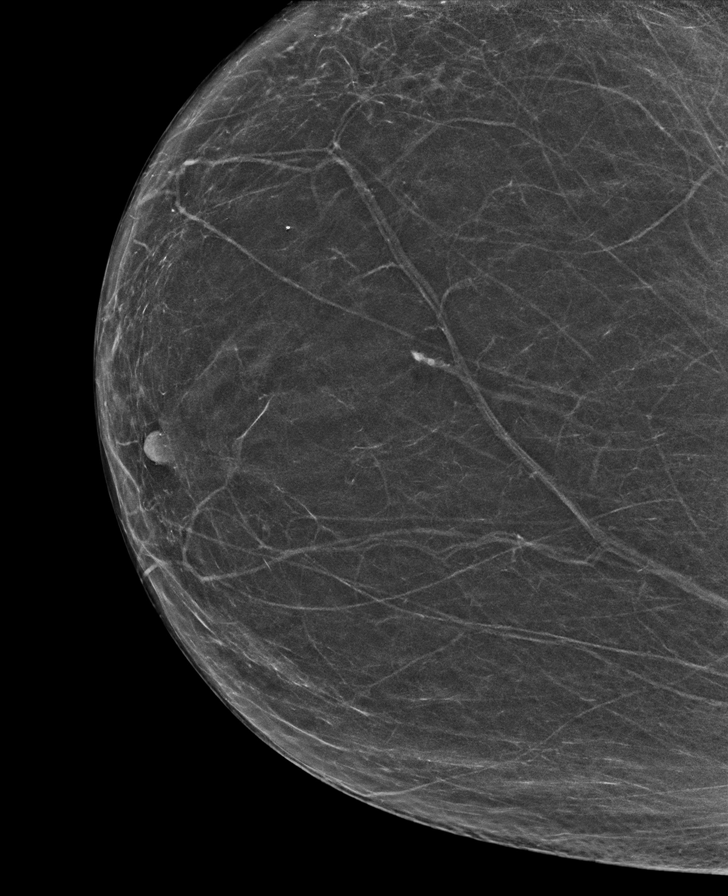

[L XCCL synth-2D]
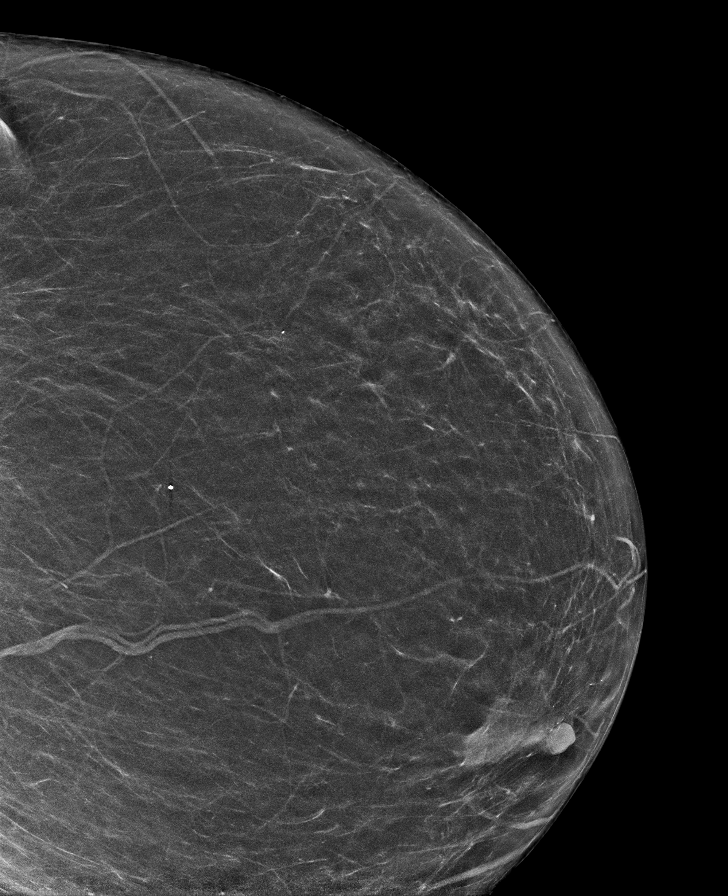

[R MLO synth-2D (2 of 2)]
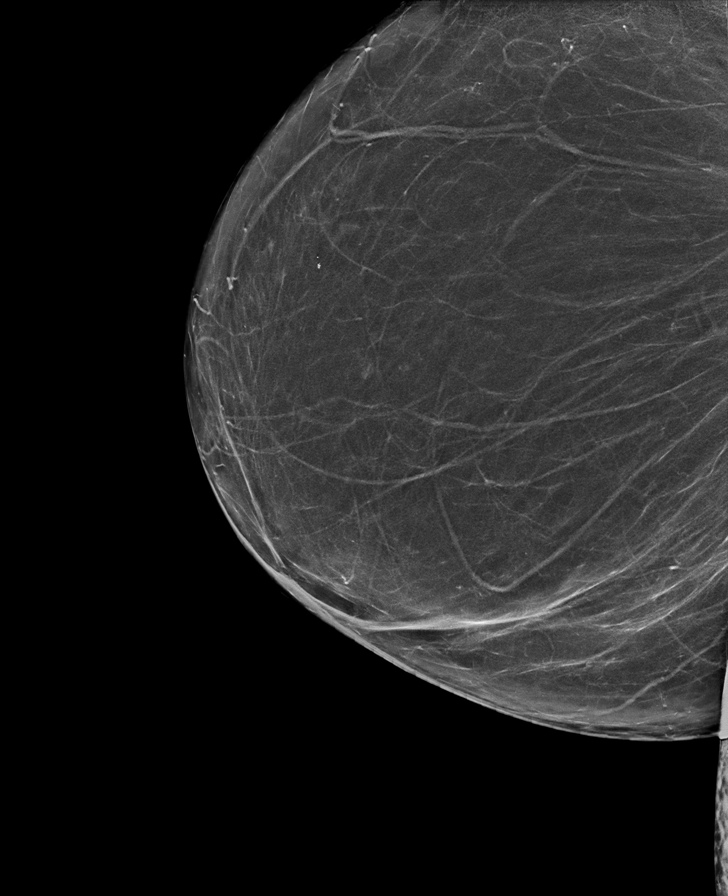

[R XCCL synth-2D]
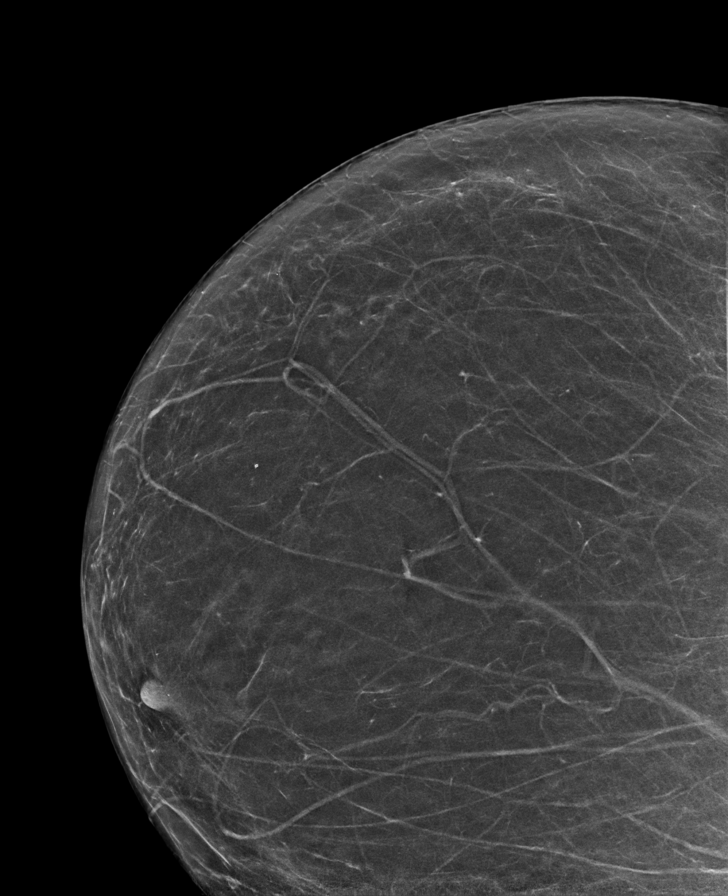

[L CC synth-2D]
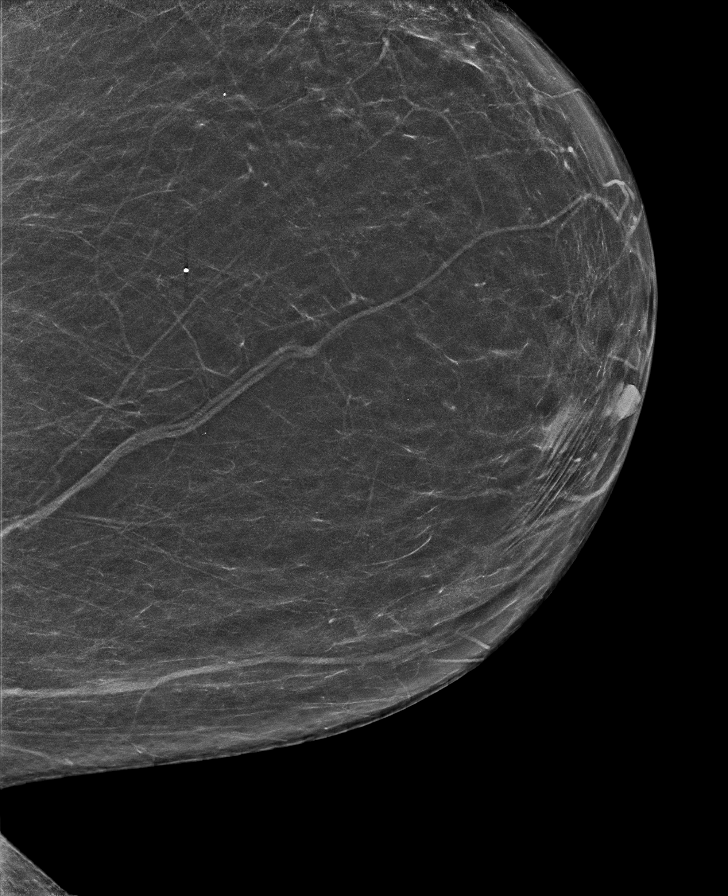

[R MLO tomo · tomo slice 61/89.0]
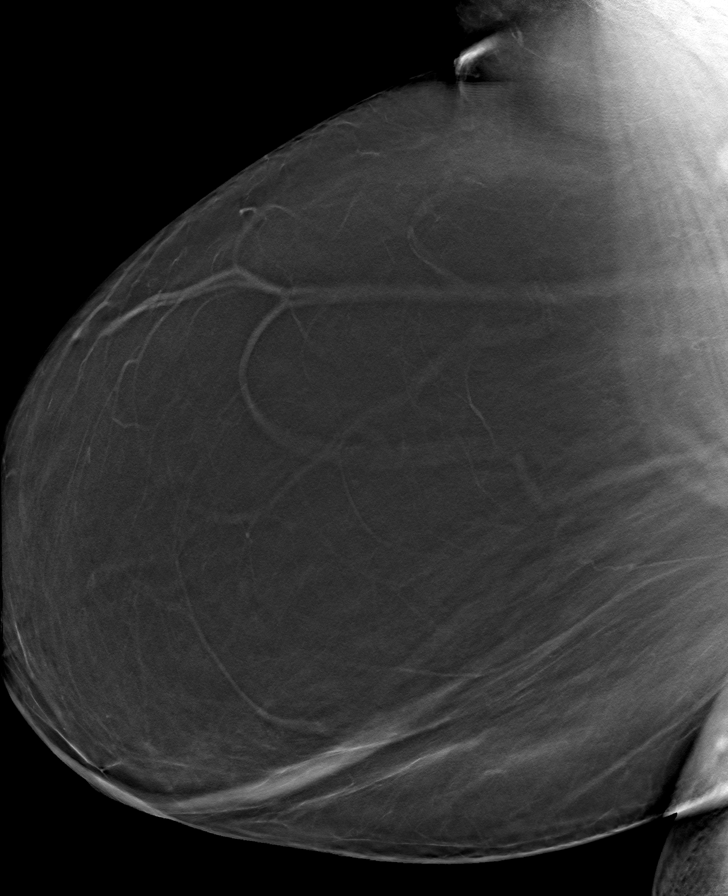

[8 of 40 positions shown; findings below may reference images not displayed]

FINDINGS: There are no findings suspicious for malignancy. Images were
processed with CAD.
IMPRESSION: No mammographic evidence of malignancy. A result letter of this
screening mammogram will be mailed directly to the patient.

RECOMMENDATION:
Screening mammogram in one year. (Code:[id number])

BI-RADS CATEGORY  1: Negative.

## 2021-08-30 ENCOUNTER — Emergency Department
Admission: EM | Admit: 2021-08-30 | Discharge: 2021-08-30 | Disposition: A | Discharge status: Home / Self Care | Payer: Medicare HMO | Attending: Emergency Medicine | Admitting: Emergency Medicine

## 2021-08-30 ENCOUNTER — Emergency Department: Payer: Medicare HMO

## 2021-08-30 ENCOUNTER — Other Ambulatory Visit: Payer: Self-pay

## 2021-08-30 DIAGNOSIS — R000 Tachycardia, unspecified: Secondary | ICD-10-CM | POA: Insufficient documentation

## 2021-08-30 DIAGNOSIS — K5900 Constipation, unspecified: Secondary | ICD-10-CM | POA: Insufficient documentation

## 2021-08-30 DIAGNOSIS — K2900 Acute gastritis without bleeding: Secondary | ICD-10-CM

## 2021-08-30 DIAGNOSIS — K2960 Other gastritis without bleeding: Secondary | ICD-10-CM | POA: Insufficient documentation

## 2021-08-30 DIAGNOSIS — R1084 Generalized abdominal pain: Secondary | ICD-10-CM

## 2021-08-30 LAB — URINALYSIS, ROUTINE W REFLEX MICROSCOPIC
Bacteria, UA: NONE SEEN
Bilirubin Urine: NEGATIVE
Glucose, UA: NEGATIVE mg/dL
Hgb urine dipstick: NEGATIVE
Ketones, ur: 20 mg/dL — AB
Nitrite: NEGATIVE
Protein, ur: 30 mg/dL — AB
Specific Gravity, Urine: 1.023 (ref 1.005–1.030)
pH: 5 (ref 5.0–8.0)

## 2021-08-30 LAB — COMPREHENSIVE METABOLIC PANEL
ALT: 28 U/L (ref 0–44)
AST: 31 U/L (ref 15–41)
Albumin: 3.6 g/dL (ref 3.5–5.0)
Alkaline Phosphatase: 60 U/L (ref 38–126)
Anion gap: 11 (ref 5–15)
BUN: 17 mg/dL (ref 8–23)
CO2: 24 mmol/L (ref 22–32)
Calcium: 9.9 mg/dL (ref 8.9–10.3)
Chloride: 101 mmol/L (ref 98–111)
Creatinine, Ser: 1.01 mg/dL — ABNORMAL HIGH (ref 0.44–1.00)
GFR, Estimated: 59 mL/min — ABNORMAL LOW (ref >60)
Glucose, Bld: 98 mg/dL (ref 70–99)
Potassium: 4.2 mmol/L (ref 3.5–5.1)
Sodium: 136 mmol/L (ref 135–145)
Total Bilirubin: 0.7 mg/dL (ref 0.3–1.2)
Total Protein: 7.3 g/dL (ref 6.5–8.1)

## 2021-08-30 LAB — CBC
HCT: 49.5 % — ABNORMAL HIGH (ref 36.0–46.0)
Hemoglobin: 15.6 g/dL — ABNORMAL HIGH (ref 12.0–15.0)
MCH: 25.7 pg — ABNORMAL LOW (ref 26.0–34.0)
MCHC: 31.5 g/dL (ref 30.0–36.0)
MCV: 81.5 fL (ref 80.0–100.0)
Platelets: 161 10*3/uL (ref 150–400)
RBC: 6.07 MIL/uL — ABNORMAL HIGH (ref 3.87–5.11)
RDW: 14.6 % (ref 11.5–15.5)
WBC: 6.7 10*3/uL (ref 4.0–10.5)
nRBC: 0 % (ref 0.0–0.2)

## 2021-08-30 LAB — LIPASE, BLOOD: Lipase: 38 U/L (ref 11–51)

## 2021-08-30 MED ORDER — ALUM & MAG HYDROXIDE-SIMETH 200-200-20 MG/5ML PO SUSP
30.0000 mL | Freq: Once | ORAL | Status: AC
  Administered 2021-08-30: 30 mL via ORAL
  Filled 2021-08-30: qty 30

## 2021-08-30 MED ORDER — FAMOTIDINE 20 MG PO TABS: 20.0000 mg | ORAL_TABLET | Freq: Two times a day (BID) | ORAL | 1 refills | Status: DC

## 2021-08-30 MED ORDER — LIDOCAINE VISCOUS HCL 2 % MT SOLN
15.0000 mL | Freq: Once | OROMUCOSAL | Status: AC
  Administered 2021-08-30: 15 mL via ORAL
  Filled 2021-08-30: qty 15

## 2021-08-30 MED ORDER — IOHEXOL 350 MG/ML SOLN
100.0000 mL | Freq: Once | INTRAVENOUS | Status: AC | PRN
  Administered 2021-08-30: 100 mL via INTRAVENOUS

## 2021-08-30 MED ORDER — LACTATED RINGERS IV BOLUS
1000.0000 mL | Freq: Once | INTRAVENOUS | Status: AC
Start: 2021-08-30 — End: 2021-08-30
  Administered 2021-08-30: 1000 mL via INTRAVENOUS

## 2021-08-30 NOTE — ED Provider Notes (Signed)
? ?[facility] ?Provider Note ? ? ? Event Date/Time  ? First MD Initiated Contact with Patient 08/30/21 1624   ?  (approximate) ? ? ?History  ? ?Abdominal Pain ? ? ?HPI ? ?Bethany Lopez is a 71 y.o. female who presents to the ED for evaluation of Abdominal Pain ?  ?Patient with intermittent generalized abdominal pain since last week.  Tells me at least 6 or 7 days of symptoms.  Reports constipation, improved with a laxative and improvement of her pain alongside this.  Reports she now has not pooped in 1 or 2 days since last taking the laxative, and her pain seems to be worsening, so she presents to the ED for evaluation.  Denies any fevers, dysuria, emesis, syncope, chest pain. ? ? ?Physical Exam  ? ?Triage Vital Signs: ?ED Triage Vitals  ?Enc Vitals Group  ?   BP 08/30/21 1339 (!) 145/98  ?   Pulse Rate 08/30/21 1339 (!) 112  ?   Resp 08/30/21 1339 20  ?   Temp 08/30/21 1339 97.6 ?F (36.4 ?C)  ?   Temp Source 08/30/21 1339 Oral  ?   SpO2 08/30/21 1339 96 %  ?   Weight --   ?   Height --   ?   Head Circumference --   ?   Peak Flow --   ?   Pain Score 08/30/21 1329 5  ?   Pain Loc --   ?   Pain Edu? --   ?   Excl. in [place]? --   ? ? ?Most recent vital signs: ?Vitals:  ? 08/30/21 1815 08/30/21 1830  ?BP:  (!) 190/96  ?Pulse: 86 91  ?Resp:    ?Temp:    ?SpO2: 97% 97%  ? ? ?General: Awake, no distress.  Pleasant and conversational.  Obese.  Well-appearing. ?CV:  Good peripheral perfusion.  ?Resp:  Normal effort.  ?Abd:  No distention.  Soft and benign throughout ?MSK:  No deformity noted.  ?Neuro:  No focal deficits appreciated. ?Other:   ? ? ?ED Results / Procedures / Treatments  ? ?Labs ?(all labs ordered are listed, but only abnormal results are displayed) ?Labs Reviewed  ?COMPREHENSIVE METABOLIC PANEL - Abnormal; Notable for the following components:  ?    Result Value  ? Creatinine, Ser 1.01 (*)   ? GFR, Estimated 59 (*)   ? All other components within normal limits  ?CBC - Abnormal;  Notable for the following components:  ? RBC 6.07 (*)   ? Hemoglobin 15.6 (*)   ? HCT 49.5 (*)   ? MCH 25.7 (*)   ? All other components within normal limits  ?URINALYSIS, ROUTINE W REFLEX MICROSCOPIC - Abnormal; Notable for the following components:  ? Color, Urine YELLOW (*)   ? APPearance HAZY (*)   ? Ketones, ur 20 (*)   ? Protein, ur 30 (*)   ? Leukocytes,Ua TRACE (*)   ? All other components within normal limits  ?URINE CULTURE  ?LIPASE, BLOOD  ? ? ?EKG ? ? ?RADIOLOGY ?CT abdomen/pelvis interpreted by me without evidence of SBO ? ?Official radiology report(s): ?CT ABDOMEN PELVIS W CONTRAST ? ?Result Date: 08/30/2021 ?CLINICAL DATA:  Abdominal pain for 3-4 days, constipation EXAM: CT ABDOMEN AND PELVIS WITH CONTRAST TECHNIQUE: Multidetector CT imaging of the abdomen and pelvis was performed using the standard protocol following bolus administration of intravenous contrast. RADIATION DOSE REDUCTION: This exam was performed according to the departmental dose-optimization program which  includes automated exposure control, adjustment of the mA and/or kV according to patient size and/or use of iterative reconstruction technique. CONTRAST:  128m OMNIPAQUE IOHEXOL 350 MG/ML SOLN COMPARISON:  None Available. FINDINGS: Lower chest: No acute pleural or parenchymal lung disease. Hepatobiliary: No focal liver abnormality is seen. No gallstones, gallbladder wall thickening, or biliary dilatation. Pancreas: Unremarkable. No pancreatic ductal dilatation or surrounding inflammatory changes. Spleen: Normal in size without focal abnormality. Adrenals/Urinary Tract: Simple bilateral renal cortical and peripelvic cysts do not require follow-up. Vascular calcifications are seen at the renal hila. No urinary tract calculi or obstructive uropathy. Bladder is decompressed, with no gross abnormalities. The adrenals are normal. Stomach/Bowel: No bowel obstruction or ileus. Normal appendix right lower quadrant. Distal colonic  diverticulosis without diverticulitis. There is nonspecific mural thickening of the gastric antrum, which could reflect distal gastritis or peptic ulcer disease. Small hiatal hernia is also noted. Vascular/Lymphatic: Aortic atherosclerosis. Subcentimeter mesenteric lymph nodes are seen adjacent to the gastric antrum. No pathologic adenopathy. Reproductive: Uterus and bilateral adnexa are unremarkable. Other: No free fluid or free intraperitoneal gas. No abdominal wall hernia. Musculoskeletal: No acute or destructive bony lesions. Diffuse thoracolumbar spondylosis and facet hypertrophy. Reconstructed images demonstrate no additional findings. IMPRESSION: 1. Mild wall thickening of the gastric antrum. Findings could reflect distal gastritis or peptic ulcer disease. 2. Distal colonic diverticulosis without diverticulitis. 3. Small hiatal hernia. 4.  Aortic Atherosclerosis (ICD10-I70.0). Electronically Signed   By: MRanda NgoM.D.   On: 08/30/2021 19:05   ? ?PROCEDURES and INTERVENTIONS: ? ?Procedures ? ?Medications  ?iohexol (OMNIPAQUE) 350 MG/ML injection 100 mL (100 mLs Intravenous Contrast Given 08/30/21 1846)  ?alum & mag hydroxide-simeth (MAALOX/MYLANTA) 200-200-20 MG/5ML suspension 30 mL (30 mLs Oral Given 08/30/21 1921)  ?  And  ?lidocaine (XYLOCAINE) 2 % viscous mouth solution 15 mL (15 mLs Oral Given 08/30/21 1921)  ?lactated ringers bolus 1,000 mL (1,000 mLs Intravenous New Bag/Given 08/30/21 1921)  ? ? ? ?IMPRESSION / MDM / ASSESSMENT AND PLAN / ED COURSE  ?I reviewed the triage vital signs and the nursing notes. ? ?Differential diagnosis includes, but is not limited to, small bowel obstruction, acute cystitis, diverticulitis, idiopathic constipation, pancreatitis ? ?{Patient presents with an acute illness or injury that is potentially life-threatening. ? ?71year old female presents with acute abdominal pain and constipation.  She is initially tachycardic and her blood work demonstrates no leukocytosis or  metabolic derangement.  No evidence of pancreatitis.  CT scan obtained and thankfully does not show evidence of small bowel obstruction or diverticulitis or any surgical pathology.  Urine with ketonuria suggestive of dehydration.  She does not have any urinary symptoms and her trace leukocytes are not convincing for acute cystitis.  We will provide IV fluids to treat dehydration, GI cocktail for CT findings of gastritis and reassess. ? ?Clinical Course as of 08/30/21 1951  ?Tue Aug 30, 2021  ?1[place]  Feeling better.  We discussed reassuring work-up so far.  We discussed dehydration, gastritis and starting H2 blockers.  We discussed return precautions. [DS]  ?  ?Clinical Course User Index ?[DS] SVladimir Crofts MD  ? ? ? ?FINAL CLINICAL IMPRESSION(S) / ED DIAGNOSES  ? ?Final diagnoses:  ?Generalized abdominal pain  ?Constipation, unspecified constipation type  ?Other acute gastritis without hemorrhage  ? ? ? ?Rx / DC Orders  ? ?ED Discharge Orders   ? ?      Ordered  ?  famotidine (PEPCID) 20 MG tablet  2 times daily       ?  08/30/21 1951  ? ?  ?  ? ?  ? ? ? ?Note:  This document was prepared using Dragon voice recognition software and may include unintentional dictation errors. ?  ?Vladimir Crofts, MD ?08/30/21 1952 ? ?

## 2021-08-30 NOTE — ED Notes (Signed)
Pt. In CT. 

## 2021-08-30 NOTE — ED Notes (Signed)
Gave patient a urine cup. ?

## 2021-08-30 NOTE — ED Triage Notes (Signed)
Pt come with c/o abdominal pain for 3-4 days. Pt denies any N/VD. Pt states some constipation and relief with laxative. ?

## 2021-08-31 LAB — URINE CULTURE: Culture: NO GROWTH

## 2021-09-02 ENCOUNTER — Telehealth: Payer: Self-pay

## 2021-09-02 NOTE — Telephone Encounter (Signed)
Yes, bc her recent iron levels were low, indicating a slow leak somewhere in the GI tract, and that would be the reason to go to GI, so they would need to consider and evaluate where the leak of blood would be, and try to treat it

## 2021-09-02 NOTE — Telephone Encounter (Signed)
Pt is calling is to ask Dr. Jenny Reichmann if she still needed to go fu with GI since she went to the ER on 5/16 and was advised that her blood work was all normal.  Please advise

## 2021-09-05 NOTE — Telephone Encounter (Signed)
Voicemail left to return call

## 2021-09-05 NOTE — Telephone Encounter (Signed)
Pt called back and I advised the pt of Dr. Jenny Reichmann recommendation to Yes, bc her recent iron levels were low, indicating a slow leak somewhere in the GI tract, and that would be the reason to go to GI, so they would need to consider and evaluate where the leak of blood would be, and try to treat it.  Pt understood and agreed to keep appt with GI.  FYI

## 2021-10-12 ENCOUNTER — Other Ambulatory Visit: Payer: Self-pay | Admitting: Internal Medicine

## 2021-10-12 NOTE — Telephone Encounter (Signed)
Please refill as per office routine med refill policy (all routine meds to be refilled for 3 mo or monthly (per pt preference) up to one year from last visit, then month to month grace period for 3 mo, then further med refills will have to be denied) ? ?

## 2021-10-19 ENCOUNTER — Telehealth: Payer: Self-pay | Admitting: Internal Medicine

## 2021-10-19 NOTE — Telephone Encounter (Signed)
Caller & Relationship to patient: Bethany Lopez  Call back number: [phone]  Date of last office visit: 05/03/21  Date of next office visit: 10/31/21  Medication(s) to be refilled:  fluticasone Asencion Islam) 50 MCG/ACT nasal spray    Preferred Pharmacy:  [place], [place] Phone:  [phone]  Fax:  [phone]

## 2021-10-20 MED ORDER — FLUTICASONE PROPIONATE 50 MCG/ACT NA SUSP: 2.0000 | Freq: Every day | NASAL | 3 refills | Status: AC

## 2021-10-31 ENCOUNTER — Encounter: Payer: Self-pay | Admitting: Internal Medicine

## 2021-10-31 ENCOUNTER — Ambulatory Visit (INDEPENDENT_AMBULATORY_CARE_PROVIDER_SITE_OTHER): Payer: Medicare HMO | Admitting: Internal Medicine

## 2021-10-31 VITALS — BP 138/80 | HR 91 | Temp 98.2°F | Ht 65.0 in | Wt 273.0 lb

## 2021-10-31 DIAGNOSIS — M1712 Unilateral primary osteoarthritis, left knee: Secondary | ICD-10-CM

## 2021-10-31 DIAGNOSIS — Z720 Tobacco use: Secondary | ICD-10-CM

## 2021-10-31 DIAGNOSIS — I10 Essential (primary) hypertension: Secondary | ICD-10-CM | POA: Diagnosis not present

## 2021-10-31 DIAGNOSIS — E7800 Pure hypercholesterolemia, unspecified: Secondary | ICD-10-CM | POA: Diagnosis not present

## 2021-10-31 DIAGNOSIS — D509 Iron deficiency anemia, unspecified: Secondary | ICD-10-CM | POA: Diagnosis not present

## 2021-10-31 LAB — LIPID PANEL
Cholesterol: 151 mg/dL (ref 0–200)
HDL: 51.1 mg/dL (ref >39.00)
LDL Cholesterol: 69 mg/dL (ref 0–99)
NonHDL: 100.17
Total CHOL/HDL Ratio: 3
Triglycerides: 156 mg/dL — ABNORMAL HIGH (ref 0.0–149.0)
VLDL: 31.2 mg/dL (ref 0.0–40.0)

## 2021-10-31 LAB — BASIC METABOLIC PANEL
BUN: 13 mg/dL (ref 6–23)
CO2: 27 mEq/L (ref 19–32)
Calcium: 9.5 mg/dL (ref 8.4–10.5)
Chloride: 103 mEq/L (ref 96–112)
Creatinine, Ser: 0.93 mg/dL (ref 0.40–1.20)
GFR: 61.26 mL/min (ref >60.00)
Glucose, Bld: 91 mg/dL (ref 70–99)
Potassium: 4.6 mEq/L (ref 3.5–5.1)
Sodium: 139 mEq/L (ref 135–145)

## 2021-10-31 LAB — CBC WITH DIFFERENTIAL/PLATELET
Basophils Absolute: 0 10*3/uL (ref 0.0–0.1)
Basophils Relative: 0.8 % (ref 0.0–3.0)
Eosinophils Absolute: 0.4 10*3/uL (ref 0.0–0.7)
Eosinophils Relative: 7.5 % — ABNORMAL HIGH (ref 0.0–5.0)
HCT: 40 % (ref 36.0–46.0)
Hemoglobin: 13 g/dL (ref 12.0–15.0)
Lymphocytes Relative: 27.3 % (ref 12.0–46.0)
Lymphs Abs: 1.5 10*3/uL (ref 0.7–4.0)
MCHC: 32.5 g/dL (ref 30.0–36.0)
MCV: 83.3 fl (ref 78.0–100.0)
Monocytes Absolute: 0.4 10*3/uL (ref 0.1–1.0)
Monocytes Relative: 7.9 % (ref 3.0–12.0)
Neutro Abs: 3.2 10*3/uL (ref 1.4–7.7)
Neutrophils Relative %: 56.5 % (ref 43.0–77.0)
Platelets: 109 10*3/uL — ABNORMAL LOW (ref 150.0–400.0)
RBC: 4.8 Mil/uL (ref 3.87–5.11)
RDW: 17.6 % — ABNORMAL HIGH (ref 11.5–15.5)
WBC: 5.7 10*3/uL (ref 4.0–10.5)

## 2021-10-31 LAB — HEPATIC FUNCTION PANEL
ALT: 33 U/L (ref 0–35)
AST: 37 U/L (ref 0–37)
Albumin: 3.8 g/dL (ref 3.5–5.2)
Alkaline Phosphatase: 62 U/L (ref 39–117)
Bilirubin, Direct: 0.1 mg/dL (ref 0.0–0.3)
Total Bilirubin: 0.8 mg/dL (ref 0.2–1.2)
Total Protein: 6.7 g/dL (ref 6.0–8.3)

## 2021-10-31 LAB — TSH: TSH: 2.3 u[IU]/mL (ref 0.35–5.50)

## 2021-10-31 NOTE — Patient Instructions (Signed)

## 2021-10-31 NOTE — Progress Notes (Signed)
Patient ID: Bethany Lopez, female   DOB: 11/02/1950, 71 y.o.   MRN: [id number]        Chief Complaint: follow up iron def anemia, left knee djd, obesity, smoking, htn, hld       HPI:  Bethany Lopez is a 71 y.o. female here overall doing ok, Pt denies chest pain, increased sob or doe, wheezing, orthopnea, PND, increased LE swelling, palpitations, dizziness or syncope.   Pt denies polydipsia, polyuria, or new focal neuro s/s.   Still smoking, not ready to quit.  Hard to lose wt, and asks about wegovy, but still declines for now.  No overt bleeding.  Denies worsening reflux, abd pain, dysphagia, n/v, bowel change or blood. Has been referred to GI with recent iron deficiency  C/o ongoing severe pain left knee DJD with f/u with ortho soon       Wt Readings from Last 3 Encounters:  10/31/21 273 lb (123.8 kg)  05/03/21 274 lb (124.3 kg)  11/01/20 270 lb 9.6 oz (122.7 kg)   BP Readings from Last 3 Encounters:  10/31/21 138/80  08/30/21 (!) 190/96  05/03/21 140/80         Past Medical History:  Diagnosis Date   Allergic rhinitis, cause unspecified 05/01/2012   Asthma    Carpal tunnel syndrome 06/17/2014   GERD (gastroesophageal reflux disease) 02/11/2015   Hyperlipidemia 05/30/2011   NEC/NOS     Hypertension    Obesity    Thrombocytopenia (HCC) 08/12/2015   Tobacco abuse    Past Surgical History:  Procedure Laterality Date   arthroscopic right knee     5-6 yrs ago   TUBAL LIGATION      reports that she has been smoking cigarettes. She has been smoking an average of .5 packs per day. She has never used smokeless tobacco. She reports current alcohol use. She reports that she does not use drugs. family history includes Diabetes in her brother, mother, and sister; Heart disease in her father; Kidney disease in her mother. Allergies  Allergen Reactions   Lodine [Etodolac] Other (See Comments)    GI upset   Penicillins    Current Outpatient Medications on File Prior to  Visit  Medication Sig Dispense Refill   acetaminophen (TYLENOL) 500 MG tablet Take by mouth.     albuterol (PROVENTIL HFA;VENTOLIN HFA) 108 (90 Base) MCG/ACT inhaler Inhale 2 puffs into the lungs every 6 (six) hours as needed for wheezing or shortness of breath. 1 Inhaler 11   amLODipine (NORVASC) 10 MG tablet Take 1 tablet (10 mg total) by mouth daily. 90 tablet 3   aspirin 81 MG EC tablet Take by mouth.     atorvastatin (LIPITOR) 20 MG tablet TAKE 1 TABLET BY MOUTH ONCE DAILY . APPOINTMENT REQUIRED FOR FUTURE REFILLS 90 tablet 0   B Complex-C (SUPER B COMPLEX PO) Take by mouth.     diclofenac sodium (VOLTAREN) 1 % GEL Apply 2 g topically 4 (four) times daily as needed. 100 g 5   Ferrous Sulfate (IRON PO) Take by mouth.     fexofenadine (ALLEGRA) 180 MG tablet Take by mouth.     FLOVENT HFA 110 MCG/ACT inhaler SMARTSIG:2 Puff(s) By Mouth Twice Daily     fluticasone (FLONASE) 50 MCG/ACT nasal spray Place 2 sprays into both nostrils daily. 45 g 3   ibuprofen (ADVIL,MOTRIN) 800 MG tablet Take 1 tablet (800 mg total) by mouth every 8 (eight) hours as needed. 90 tablet 3   iron polysaccharides (  NU-IRON) 150 MG capsule Take 1 capsule (150 mg total) by mouth daily. 90 capsule 1   losartan (COZAAR) 50 MG tablet Take 1 tablet by mouth once daily 90 tablet 0   meloxicam (MOBIC) 15 MG tablet Take 15 mg by mouth daily.     Multiple Vitamin (MULTIVITAMIN) tablet Take 1 tablet by mouth daily.     NIFEdipine (ADALAT CC) 30 MG 24 hr tablet nifedipine ER 30 mg tablet,extended release     Omega-3 Fatty Acids (FISH OIL) 1000 MG CAPS Take 1 capsule by mouth daily.     triamcinolone (NASACORT) 55 MCG/ACT AERO nasal inhaler USE 2 SPRAY(S) INTRANASALLY ONCE DAILY 17 g 2   triamcinolone cream (KENALOG) 0.1 % APPLY  CREAM EXTERNALLY TO AFFECTED AREA TWICE DAILY AS NEEDED 80 g 1   triamcinolone cream (KENALOG) 0.1 % Apply topically.     VENTOLIN HFA 108 (90 Base) MCG/ACT inhaler INHALE 2 PUFFS BY MOUTH EVERY 6 HOURS  AS NEEDED FOR WHEEZING FOR SHORTNESS OF BREATH 18 g 0   cetirizine (ZYRTEC) 10 MG tablet Take 1 tablet (10 mg total) by mouth daily. 90 tablet 3   famotidine (PEPCID) 20 MG tablet Take 1 tablet (20 mg total) by mouth 2 (two) times daily. (Patient not taking: Reported on 10/31/2021) 60 tablet 1   famotidine (PEPCID) 40 MG tablet SMARTSIG:1 Tablet(s) By Mouth Every Evening (Patient not taking: Reported on 10/31/2021)     No current facility-administered medications on file prior to visit.        ROS:  All others reviewed and negative.  Objective        PE:  BP 138/80 (BP Location: Right Arm, Patient Position: Sitting, Cuff Size: Large)   Pulse 91   Temp 98.2 F (36.8 C) (Oral)   Ht 5\' 5"  (1.651 m)   Wt 273 lb (123.8 kg)   SpO2 98%   BMI 45.43 kg/m                 Constitutional: Pt appears in NAD               HENT: Head: NCAT.                Right Ear: External ear normal.                 Left Ear: External ear normal.                Eyes: . Pupils are equal, round, and reactive to light. Conjunctivae and EOM are normal               Nose: without d/c or deformity               Neck: Neck supple. Gross normal ROM               Cardiovascular: Normal rate and regular rhythm.                 Pulmonary/Chest: Effort normal and breath sounds without rales or wheezing.                Left knee with severe degenerative change               Neurological: Pt is alert. At baseline orientation, motor grossly intact               Skin: Skin is warm. No rashes, no other new lesions, LE edema - none  Psychiatric: Pt behavior is normal without agitation   Micro: none  Cardiac tracings I have personally interpreted today:  none  Pertinent Radiological findings (summarize): none   Lab Results  Component Value Date   WBC 5.7 10/31/2021   HGB 13.0 10/31/2021   HCT 40.0 10/31/2021   PLT 109.0 (L) 10/31/2021   GLUCOSE 91 10/31/2021   CHOL 151 10/31/2021   TRIG 156.0 (H)  10/31/2021   HDL 51.10 10/31/2021   LDLDIRECT 74.0 06/13/2018   LDLCALC 69 10/31/2021   ALT 33 10/31/2021   AST 37 10/31/2021   NA 139 10/31/2021   K 4.6 10/31/2021   CL 103 10/31/2021   CREATININE 0.93 10/31/2021   BUN 13 10/31/2021   CO2 27 10/31/2021   TSH 2.30 10/31/2021   INR 1.1 11/12/2012   HGBA1C 5.7 05/03/2021   Assessment/Plan:  Bethany Lopez is a 72 y.o. Black or African American [2] female with  has a past medical history of Allergic rhinitis, cause unspecified (05/01/2012), Asthma, Carpal tunnel syndrome (06/17/2014), GERD (gastroesophageal reflux disease) (02/11/2015), Hyperlipidemia (05/30/2011), Hypertension, Obesity, Thrombocytopenia (HCC) (08/12/2015), and Tobacco abuse.  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  Oliver Barre, MD 11/02/2021 7:51 PM [facility]

## 2021-11-02 ENCOUNTER — Encounter: Payer: Self-pay | Admitting: Internal Medicine

## 2021-11-02 DIAGNOSIS — M1712 Unilateral primary osteoarthritis, left knee: Secondary | ICD-10-CM | POA: Insufficient documentation

## 2021-11-02 NOTE — Assessment & Plan Note (Signed)
Pt counsled to quit, pt not ready °

## 2021-11-02 NOTE — Assessment & Plan Note (Signed)
BP Readings from Last 3 Encounters:  10/31/21 138/80  08/30/21 (!) 190/96  05/03/21 140/80   Stable, pt to continue medical treatment amlodipine 10 mg qd, losartan  50 mg qd

## 2021-11-02 NOTE — Assessment & Plan Note (Signed)
Lab Results  Component Value Date   LDLCALC 69 10/31/2021   Stable, pt to continue current statin lipitor 20 mg

## 2021-11-02 NOTE — Assessment & Plan Note (Signed)
Severe end stage, for f/u ortho as planned

## 2021-11-02 NOTE — Assessment & Plan Note (Addendum)
Recent onset uncontrolled, Asympt, no overt bleeding, cont iron supplement, for f/u GI as referred

## 2021-11-15 ENCOUNTER — Ambulatory Visit: Payer: Medicare HMO | Admitting: Gastroenterology

## 2021-11-29 ENCOUNTER — Other Ambulatory Visit: Payer: Self-pay | Admitting: Internal Medicine

## 2021-11-29 MED ORDER — ALPRAZOLAM 0.25 MG PO TABS: 0.2500 mg | ORAL_TABLET | Freq: Two times a day (BID) | ORAL | 2 refills | Status: DC | PRN

## 2022-01-07 ENCOUNTER — Other Ambulatory Visit: Payer: Self-pay | Admitting: Internal Medicine

## 2022-01-07 NOTE — Telephone Encounter (Signed)
Please refill as per office routine med refill policy (all routine meds to be refilled for 3 mo or monthly (per pt preference) up to one year from last visit, then month to month grace period for 3 mo, then further med refills will have to be denied) ? ?

## 2022-02-07 ENCOUNTER — Telehealth: Payer: Self-pay | Admitting: Internal Medicine

## 2022-02-07 MED ORDER — MELOXICAM 15 MG PO TABS: 15.0000 mg | ORAL_TABLET | Freq: Every day | ORAL | 1 refills | Status: DC | PRN

## 2022-02-07 NOTE — Telephone Encounter (Signed)
Done erx 

## 2022-02-07 NOTE — Telephone Encounter (Signed)
Patient needs her meloxicam refilled - Please send to [facility] on [address]  Previous Appt:  10/31/2021  Next appointment:  05/03/2021

## 2022-04-03 ENCOUNTER — Other Ambulatory Visit: Payer: Self-pay | Admitting: Internal Medicine

## 2022-04-03 NOTE — Telephone Encounter (Signed)
Please refill as per office routine med refill policy (all routine meds to be refilled for 3 mo or monthly (per pt preference) up to one year from last visit, then month to month grace period for 3 mo, then further med refills will have to be denied) ? ?

## 2022-05-03 ENCOUNTER — Encounter: Payer: Self-pay | Admitting: Internal Medicine

## 2022-05-03 ENCOUNTER — Ambulatory Visit (INDEPENDENT_AMBULATORY_CARE_PROVIDER_SITE_OTHER): Payer: Medicare HMO | Admitting: Internal Medicine

## 2022-05-03 VITALS — BP 126/74 | HR 93 | Temp 98.0°F | Ht 65.0 in | Wt 283.0 lb

## 2022-05-03 DIAGNOSIS — I10 Essential (primary) hypertension: Secondary | ICD-10-CM

## 2022-05-03 DIAGNOSIS — E538 Deficiency of other specified B group vitamins: Secondary | ICD-10-CM | POA: Diagnosis not present

## 2022-05-03 DIAGNOSIS — E7800 Pure hypercholesterolemia, unspecified: Secondary | ICD-10-CM

## 2022-05-03 DIAGNOSIS — E559 Vitamin D deficiency, unspecified: Secondary | ICD-10-CM

## 2022-05-03 DIAGNOSIS — D509 Iron deficiency anemia, unspecified: Secondary | ICD-10-CM

## 2022-05-03 DIAGNOSIS — Z1231 Encounter for screening mammogram for malignant neoplasm of breast: Secondary | ICD-10-CM

## 2022-05-03 DIAGNOSIS — R739 Hyperglycemia, unspecified: Secondary | ICD-10-CM | POA: Diagnosis not present

## 2022-05-03 DIAGNOSIS — R062 Wheezing: Secondary | ICD-10-CM

## 2022-05-03 DIAGNOSIS — Z0001 Encounter for general adult medical examination with abnormal findings: Secondary | ICD-10-CM | POA: Diagnosis not present

## 2022-05-03 DIAGNOSIS — Z1211 Encounter for screening for malignant neoplasm of colon: Secondary | ICD-10-CM | POA: Diagnosis not present

## 2022-05-03 DIAGNOSIS — Z23 Encounter for immunization: Secondary | ICD-10-CM

## 2022-05-03 DIAGNOSIS — Z720 Tobacco use: Secondary | ICD-10-CM | POA: Diagnosis not present

## 2022-05-03 DIAGNOSIS — J4521 Mild intermittent asthma with (acute) exacerbation: Secondary | ICD-10-CM | POA: Diagnosis not present

## 2022-05-03 LAB — CBC WITH DIFFERENTIAL/PLATELET
Basophils Absolute: 0 10*3/uL (ref 0.0–0.1)
Basophils Relative: 0.6 % (ref 0.0–3.0)
Eosinophils Absolute: 0.3 10*3/uL (ref 0.0–0.7)
Eosinophils Relative: 3.9 % (ref 0.0–5.0)
HCT: 44.8 % (ref 36.0–46.0)
Hemoglobin: 14.6 g/dL (ref 12.0–15.0)
Lymphocytes Relative: 25.6 % (ref 12.0–46.0)
Lymphs Abs: 1.8 10*3/uL (ref 0.7–4.0)
MCHC: 32.5 g/dL (ref 30.0–36.0)
MCV: 84.7 fl (ref 78.0–100.0)
Monocytes Absolute: 0.5 10*3/uL (ref 0.1–1.0)
Monocytes Relative: 7.6 % (ref 3.0–12.0)
Neutro Abs: 4.4 10*3/uL (ref 1.4–7.7)
Neutrophils Relative %: 62.3 % (ref 43.0–77.0)
Platelets: 106 10*3/uL — ABNORMAL LOW (ref 150.0–400.0)
RBC: 5.28 Mil/uL — ABNORMAL HIGH (ref 3.87–5.11)
RDW: 17.6 % — ABNORMAL HIGH (ref 11.5–15.5)
WBC: 7.1 10*3/uL (ref 4.0–10.5)

## 2022-05-03 LAB — BASIC METABOLIC PANEL
BUN: 12 mg/dL (ref 6–23)
CO2: 28 mEq/L (ref 19–32)
Calcium: 9.6 mg/dL (ref 8.4–10.5)
Chloride: 103 mEq/L (ref 96–112)
Creatinine, Ser: 0.95 mg/dL (ref 0.40–1.20)
GFR: 59.5 mL/min — ABNORMAL LOW (ref >60.00)
Glucose, Bld: 82 mg/dL (ref 70–99)
Potassium: 4.2 mEq/L (ref 3.5–5.1)
Sodium: 140 mEq/L (ref 135–145)

## 2022-05-03 LAB — TSH: TSH: 2.51 u[IU]/mL (ref 0.35–5.50)

## 2022-05-03 LAB — IBC PANEL
Iron: 54 ug/dL (ref 42–145)
Saturation Ratios: 14.6 % — ABNORMAL LOW (ref 20.0–50.0)
TIBC: 371 ug/dL (ref 250.0–450.0)
Transferrin: 265 mg/dL (ref 212.0–360.0)

## 2022-05-03 LAB — HEPATIC FUNCTION PANEL
ALT: 36 U/L — ABNORMAL HIGH (ref 0–35)
AST: 36 U/L (ref 0–37)
Albumin: 4 g/dL (ref 3.5–5.2)
Alkaline Phosphatase: 69 U/L (ref 39–117)
Bilirubin, Direct: 0.2 mg/dL (ref 0.0–0.3)
Total Bilirubin: 0.8 mg/dL (ref 0.2–1.2)
Total Protein: 7.6 g/dL (ref 6.0–8.3)

## 2022-05-03 LAB — LIPID PANEL
Cholesterol: 152 mg/dL (ref 0–200)
HDL: 52.8 mg/dL (ref >39.00)
LDL Cholesterol: 67 mg/dL (ref 0–99)
NonHDL: 99.17
Total CHOL/HDL Ratio: 3
Triglycerides: 162 mg/dL — ABNORMAL HIGH (ref 0.0–149.0)
VLDL: 32.4 mg/dL (ref 0.0–40.0)

## 2022-05-03 LAB — VITAMIN D 25 HYDROXY (VIT D DEFICIENCY, FRACTURES): VITD: 37.94 ng/mL (ref 30.00–100.00)

## 2022-05-03 LAB — HEMOGLOBIN A1C: Hgb A1c MFr Bld: 5.8 % (ref 4.6–6.5)

## 2022-05-03 LAB — FERRITIN: Ferritin: 48.7 ng/mL (ref 10.0–291.0)

## 2022-05-03 LAB — VITAMIN B12: Vitamin B-12: 612 pg/mL (ref 211–911)

## 2022-05-03 MED ORDER — METHYLPREDNISOLONE ACETATE 80 MG/ML IJ SUSP
80.0000 mg | Freq: Once | INTRAMUSCULAR | Status: AC
  Administered 2022-05-03: 80 mg via INTRAMUSCULAR

## 2022-05-03 NOTE — Assessment & Plan Note (Signed)
No recent overt bleeding, but for iron with labs

## 2022-05-03 NOTE — Patient Instructions (Signed)
You had the flu shot today  Please continue all other medications as before, and refills have been done if requested.  Please have the pharmacy call with any other refills you may need.  Please continue your efforts at being more active, low cholesterol diet, and weight control.  You are otherwise up to date with prevention measures today.  Please keep your appointments with your specialists as you may have planned  You will be contacted regarding the referral for: colonoscopy, and mammogram  Please go to the LAB at the blood drawing area for the tests to be done  You will be contacted by phone if any changes need to be made immediately.  Otherwise, you will receive a letter about your results with an explanation, but please check with MyChart first.  Please remember to sign up for MyChart if you have not done so, as this will be important to you in the future with finding out test results, communicating by private email, and scheduling acute appointments online when needed.  Please make an Appointment to return in 6 months, or sooner if needed

## 2022-05-03 NOTE — Assessment & Plan Note (Signed)
Pt counsled to quit, pt not ready, declines pulm referral for LDCT

## 2022-05-03 NOTE — Assessment & Plan Note (Signed)
Lab Results  Component Value Date   LDLCALC 69 10/31/2021   Stable, pt to continue current statin lipitor 20 mg qd

## 2022-05-03 NOTE — Assessment & Plan Note (Signed)
Mild to mod, for depomedrol IM 80 mg, decliens cxr , continue inhaler prn,,  to f/u any worsening symptoms or concerns

## 2022-05-03 NOTE — Assessment & Plan Note (Signed)
Age and sex appropriate education and counseling updated with regular exercise and diet Referrals for preventative services - for colonoscopy and mammogram Immunizations addressed - declines covid booster, but for flu shot today Smoking counseling  - pt counsled to quit, pt not ready Evidence for depression or other mood disorder - none significant Most recent labs reviewed. I have personally reviewed and have noted: 1) the patient's medical and social history 2) The patient's current medications and supplements 3) The patient's height, weight, and BMI have been recorded in the chart

## 2022-05-03 NOTE — Assessment & Plan Note (Signed)
BP Readings from Last 3 Encounters:  05/03/22 126/74  10/31/21 138/80  08/30/21 (!) 190/96   Stable, pt to continue medical treatment norvasc 10 mg qd, losartan 50 mg qd, adalat cc 30 qd

## 2022-05-03 NOTE — Progress Notes (Signed)
Patient ID: Bethany Lopez, female   DOB: [birth year], 72 y.o.   MRN: [id number]         Chief Complaint:: wellness exam and asthma exacerbation, hld, htn, smoker       HPI:  Bethany Lopez is a 72 y.o. female here for wellness exam; still smoking, not ready to quit. Due for colonoscopy, mammogram, declines covid booster and tdap, for flu shot today,  o/w up to date.    Also, Pt denies chest pain, increased sob or doe, wheezing, orthopnea, PND, increased LE swelling, palpitations, dizziness or syncope, except for wheezing onset x 2 days with mild sob, asking for depomedrol today.  Pt denies polydipsia, polyuria, or new focal neuro s/s.    Pt denies fever, wt loss, night sweats, loss of appetite, or other constitutional symptoms  Unfortunately gaining wt, admits to sedentary lifestyle, but plans to start being more active.  .   Wt Readings from Last 3 Encounters:  05/03/22 283 lb (128.4 kg)  10/31/21 273 lb (123.8 kg)  05/03/21 274 lb (124.3 kg)   BP Readings from Last 3 Encounters:  05/03/22 126/74  10/31/21 138/80  08/30/21 (!) 190/96   Immunization History  Administered Date(s) Administered   Fluad Quad(high Dose 65+) 05/03/2022   Influenza, High Dose Seasonal PF 02/15/2017   Influenza,inj,Quad PF,6+ Mos 02/11/2015, 02/10/2016, 02/05/2020   Influenza,inj,quad, With Preservative 12/31/2018   Influenza-Unspecified 01/01/2014, 02/11/2015, 02/10/2016, 12/28/2020   PFIZER Comirnaty(Gray Top)Covid-19 Tri-Sucrose Vaccine 08/12/2019, 09/02/2019   PFIZER(Purple Top)SARS-COV-2 Vaccination 03/30/2020, 03/02/2021, 10/19/2021   Pneumococcal Conjugate-13 02/18/2015   Pneumococcal Polysaccharide-23 08/12/2015   Tdap 12/06/2011   Zoster Recombinat (Shingrix) 12/31/2018, 02/24/2019   Zoster, Live 04/30/2012   Health Maintenance Due  Topic Date Due   DTaP/Tdap/Td (2 - Td or Tdap) 12/05/2021      Past Medical History:  Diagnosis Date   Allergic rhinitis, cause unspecified  05/01/2012   Asthma    Carpal tunnel syndrome 06/17/2014   GERD (gastroesophageal reflux disease) 02/11/2015   Hyperlipidemia 05/30/2011   NEC/NOS     Hypertension    Obesity    Thrombocytopenia ([place]) 08/12/2015   Tobacco abuse    Past Surgical History:  Procedure Laterality Date   arthroscopic right knee     5-6 yrs ago   TUBAL LIGATION      reports that she has been smoking cigarettes. She has been smoking an average of .5 packs per day. She has never used smokeless tobacco. She reports current alcohol use. She reports that she does not use drugs. family history includes Diabetes in her brother, mother, and sister; Heart disease in her father; Kidney disease in her mother. Allergies  Allergen Reactions   Lodine [Etodolac] Other (See Comments)    GI upset   Penicillins    Current Outpatient Medications on File Prior to Visit  Medication Sig Dispense Refill   acetaminophen (TYLENOL) 500 MG tablet Take by mouth.     albuterol (PROVENTIL HFA;VENTOLIN HFA) 108 (90 Base) MCG/ACT inhaler Inhale 2 puffs into the lungs every 6 (six) hours as needed for wheezing or shortness of breath. 1 Inhaler 11   ALPRAZolam (XANAX) 0.25 MG tablet Take 1 tablet (0.25 mg total) by mouth 2 (two) times daily as needed for anxiety. 60 tablet 2   amLODipine (NORVASC) 10 MG tablet Take 1 tablet (10 mg total) by mouth daily. 90 tablet 3   aspirin 81 MG EC tablet Take by mouth.     atorvastatin (LIPITOR) 20 MG tablet  TAKE 1 TABLET BY MOUTH ONCE DAILY (APPOINTMENT  REQUIRED  FOR  FUTURE  REFILLS) 90 tablet 0   B Complex-C (SUPER B COMPLEX PO) Take by mouth.     diclofenac sodium (VOLTAREN) 1 % GEL Apply 2 g topically 4 (four) times daily as needed. 100 g 5   Ferrous Sulfate (IRON PO) Take by mouth.     fexofenadine (ALLEGRA) 180 MG tablet Take by mouth.     FLOVENT HFA 110 MCG/ACT inhaler SMARTSIG:2 Puff(s) By Mouth Twice Daily     fluticasone (FLONASE) 50 MCG/ACT nasal spray Place 2 sprays into both nostrils  daily. 45 g 3   ibuprofen (ADVIL,MOTRIN) 800 MG tablet Take 1 tablet (800 mg total) by mouth every 8 (eight) hours as needed. 90 tablet 3   iron polysaccharides (NU-IRON) 150 MG capsule Take 1 capsule (150 mg total) by mouth daily. 90 capsule 1   losartan (COZAAR) 50 MG tablet Take 2 tablets by mouth once daily 180 tablet 0   meloxicam (MOBIC) 15 MG tablet Take 1 tablet (15 mg total) by mouth daily as needed for pain. 90 tablet 1   Multiple Vitamin (MULTIVITAMIN) tablet Take 1 tablet by mouth daily.     NIFEdipine (ADALAT CC) 30 MG 24 hr tablet nifedipine ER 30 mg tablet,extended release     Omega-3 Fatty Acids (FISH OIL) 1000 MG CAPS Take 1 capsule by mouth daily.     triamcinolone (NASACORT) 55 MCG/ACT AERO nasal inhaler USE 2 SPRAY(S) INTRANASALLY ONCE DAILY 17 g 2   triamcinolone cream (KENALOG) 0.1 % APPLY  CREAM EXTERNALLY TO AFFECTED AREA TWICE DAILY AS NEEDED 80 g 1   triamcinolone cream (KENALOG) 0.1 % Apply topically.     VENTOLIN HFA 108 (90 Base) MCG/ACT inhaler INHALE 2 PUFFS BY MOUTH EVERY 6 HOURS AS NEEDED FOR WHEEZING FOR SHORTNESS OF BREATH 18 g 0   cetirizine (ZYRTEC) 10 MG tablet Take 1 tablet (10 mg total) by mouth daily. 90 tablet 3   No current facility-administered medications on file prior to visit.        ROS:  All others reviewed and negative.  Objective        PE:  BP 126/74 (BP Location: Right Arm, Patient Position: Sitting, Cuff Size: Large)   Pulse 93   Temp 98 F (36.7 C) (Oral)   Ht '5\' 5"'$  (1.651 m)   Wt 283 lb (128.4 kg)   SpO2 95%   BMI 47.09 kg/m                 Constitutional: Pt appears in NAD               HENT: Head: NCAT.                Right Ear: External ear normal.                 Left Ear: External ear normal.                Eyes: . Pupils are equal, round, and reactive to light. Conjunctivae and EOM are normal               Nose: without d/c or deformity               Neck: Neck supple. Gross normal ROM               Cardiovascular:  Normal rate and regular rhythm.  Pulmonary/Chest: Effort normal and breath sounds without rales but with few bilateral wheezing.                Abd:  Soft, NT, ND, + BS, no organomegaly               Neurological: Pt is alert. At baseline orientation, motor grossly intact               Skin: Skin is warm. No rashes, no other new lesions, LE edema - none               Psychiatric: Pt behavior is normal without agitation   Micro: none  Cardiac tracings I have personally interpreted today:  none  Pertinent Radiological findings (summarize): none   Lab Results  Component Value Date   WBC 5.7 10/31/2021   HGB 13.0 10/31/2021   HCT 40.0 10/31/2021   PLT 109.0 (L) 10/31/2021   GLUCOSE 91 10/31/2021   CHOL 151 10/31/2021   TRIG 156.0 (H) 10/31/2021   HDL 51.10 10/31/2021   LDLDIRECT 74.0 06/13/2018   LDLCALC 69 10/31/2021   ALT 33 10/31/2021   AST 37 10/31/2021   NA 139 10/31/2021   K 4.6 10/31/2021   CL 103 10/31/2021   CREATININE 0.93 10/31/2021   BUN 13 10/31/2021   CO2 27 10/31/2021   TSH 2.30 10/31/2021   INR 1.1 11/12/2012   HGBA1C 5.7 05/03/2021   Assessment/Plan:  Cheyne Bungert is a 72 y.o. Black or African American [2] female with  has a past medical history of Allergic rhinitis, cause unspecified (05/01/2012), Asthma, Carpal tunnel syndrome (06/17/2014), GERD (gastroesophageal reflux disease) (02/11/2015), Hyperlipidemia (05/30/2011), Hypertension, Obesity, Thrombocytopenia ([place]) (08/12/2015), and Tobacco abuse.  Encounter for preventative adult health care exam with abnormal findings Age and sex appropriate education and counseling updated with regular exercise and diet Referrals for preventative services - for colonoscopy and mammogram Immunizations addressed - declines covid booster, but for flu shot today Smoking counseling  - pt counsled to quit, pt not ready Evidence for depression or other mood disorder - none significant Most recent labs  reviewed. I have personally reviewed and have noted: 1) the patient's medical and social history 2) The patient's current medications and supplements 3) The patient's height, weight, and BMI have been recorded in the chart   Anemia, iron deficiency No recent overt bleeding, but for iron with labs  Asthma with acute exacerbation Mild to mod, for depomedrol IM 80 mg, decliens cxr , continue inhaler prn,,  to f/u any worsening symptoms or concerns   Hyperlipidemia Lab Results  Component Value Date   LDLCALC 69 10/31/2021   Stable, pt to continue current statin lipitor 20 mg qd   Hypertension BP Readings from Last 3 Encounters:  05/03/22 126/74  10/31/21 138/80  08/30/21 (!) 190/96   Stable, pt to continue medical treatment norvasc 10 mg qd, losartan 50 mg qd, adalat cc 30 qd   Tobacco use Pt counsled to quit, pt not ready, declines pulm referral for LDCT  Followup: Return in about 6 months (around 11/01/2022).  Cathlean Cower, MD 05/03/2022 1:14 PM [facility]

## 2022-05-12 ENCOUNTER — Telehealth: Payer: Self-pay | Admitting: Internal Medicine

## 2022-05-12 DIAGNOSIS — Z1231 Encounter for screening mammogram for malignant neoplasm of breast: Secondary | ICD-10-CM

## 2022-05-12 NOTE — Telephone Encounter (Signed)
Patient called and asked for her mammogram order to be sent to [facility].

## 2022-05-15 NOTE — Telephone Encounter (Signed)
Ok this is adjusted and ordered

## 2022-05-15 NOTE — Telephone Encounter (Signed)
Notified pt MD ordered mammo in [place].Marland KitchenJohny Chess

## 2022-05-16 ENCOUNTER — Telehealth: Payer: Self-pay | Admitting: Internal Medicine

## 2022-05-16 NOTE — Telephone Encounter (Signed)
Patient called requesting results from labs from 05/03/22. She said she has not received what was mailed out. She would like a nurse to call her back. Callback at [phone].

## 2022-05-16 NOTE — Telephone Encounter (Signed)
Informed patient that results were mailed out and should be received this week.

## 2022-06-08 DIAGNOSIS — H524 Presbyopia: Secondary | ICD-10-CM | POA: Diagnosis not present

## 2022-07-06 ENCOUNTER — Other Ambulatory Visit: Payer: Self-pay | Admitting: Internal Medicine

## 2022-10-03 ENCOUNTER — Other Ambulatory Visit: Payer: Self-pay | Admitting: Internal Medicine

## 2022-10-16 ENCOUNTER — Other Ambulatory Visit: Payer: Self-pay

## 2022-10-16 MED ORDER — ALPRAZOLAM 0.25 MG PO TABS: 0.2500 mg | ORAL_TABLET | Freq: Two times a day (BID) | ORAL | 2 refills | Status: DC | PRN

## 2022-10-26 ENCOUNTER — Encounter: Payer: Self-pay | Admitting: Internal Medicine

## 2022-10-26 ENCOUNTER — Ambulatory Visit (INDEPENDENT_AMBULATORY_CARE_PROVIDER_SITE_OTHER): Payer: Medicare HMO | Admitting: Internal Medicine

## 2022-10-26 VITALS — BP 124/82 | HR 95 | Temp 98.7°F | Ht 65.0 in | Wt 289.0 lb

## 2022-10-26 DIAGNOSIS — E7800 Pure hypercholesterolemia, unspecified: Secondary | ICD-10-CM | POA: Diagnosis not present

## 2022-10-26 DIAGNOSIS — Z720 Tobacco use: Secondary | ICD-10-CM | POA: Diagnosis not present

## 2022-10-26 DIAGNOSIS — J45909 Unspecified asthma, uncomplicated: Secondary | ICD-10-CM

## 2022-10-26 DIAGNOSIS — I10 Essential (primary) hypertension: Secondary | ICD-10-CM | POA: Diagnosis not present

## 2022-10-26 DIAGNOSIS — R739 Hyperglycemia, unspecified: Secondary | ICD-10-CM

## 2022-10-26 DIAGNOSIS — Z6841 Body Mass Index (BMI) 40.0 and over, adult: Secondary | ICD-10-CM | POA: Diagnosis not present

## 2022-10-26 DIAGNOSIS — J309 Allergic rhinitis, unspecified: Secondary | ICD-10-CM | POA: Diagnosis not present

## 2022-10-26 LAB — POCT GLYCOSYLATED HEMOGLOBIN (HGB A1C): Hemoglobin A1C: 5.3 % (ref 4.0–5.6)

## 2022-10-26 MED ORDER — METHYLPREDNISOLONE ACETATE 80 MG/ML IJ SUSP
80.0000 mg | Freq: Once | INTRAMUSCULAR | Status: AC
Start: 2022-10-26 — End: 2022-10-26
  Administered 2022-10-26: 80 mg via INTRAMUSCULAR

## 2022-10-26 NOTE — Progress Notes (Signed)
Patient ID: Bethany Lopez, female   DOB: [birth year], 72 y.o.   MRN: [id number]        Chief Complaint: follow up asthma exacerbation, hyperglycemia , anxiety, hld, htn, smokder       HPI:  Bethany Lopez is a 72 y.o. female here with c/o mild 2 wks onset wheeziness and sob doe similar to previous asthma exac, hoping for depomedrol IM today.  Pt denies chest pain, orthopnea, PND, increased LE swelling, palpitations, dizziness or syncope.  Pt denies polydipsia, polyuria, or new focal neuro s/s.    Pt denies fever, wt loss, night sweats, loss of appetite, or other constitutional symptoms  Has treadmill at home, plans to start more soon.  No longer needs xanax for anxiety as seems much improved recently, asks for stop on med list.  Still smoking, not ready to quit.  Does have several wks ongoing nasal allergy symptoms with clearish congestion, itch and sneezing, without fever, pain, ST, cough, swelling or wheezing.    Wt Readings from Last 3 Encounters:  10/26/22 289 lb (131.1 kg)  05/03/22 283 lb (128.4 kg)  10/31/21 273 lb (123.8 kg)   BP Readings from Last 3 Encounters:  10/26/22 124/82  05/03/22 126/74  10/31/21 138/80         Past Medical History:  Diagnosis Date   Allergic rhinitis, cause unspecified 05/01/2012   Asthma    Carpal tunnel syndrome 06/17/2014   GERD (gastroesophageal reflux disease) 02/11/2015   Hyperlipidemia 05/30/2011   NEC/NOS     Hypertension    Obesity    Thrombocytopenia ([facility]) 08/12/2015   Tobacco abuse    Past Surgical History:  Procedure Laterality Date   arthroscopic right knee     5-6 yrs ago   TUBAL LIGATION      reports that she has been smoking cigarettes. She has never used smokeless tobacco. She reports current alcohol use. She reports that she does not use drugs. family history includes Diabetes in her brother, mother, and sister; Heart disease in her father; Kidney disease in her mother. Allergies  Allergen Reactions   Lodine  [Etodolac] Other (See Comments)    GI upset   Penicillins    Current Outpatient Medications on File Prior to Visit  Medication Sig Dispense Refill   acetaminophen (TYLENOL) 500 MG tablet Take by mouth.     albuterol (PROVENTIL HFA;VENTOLIN HFA) 108 (90 Base) MCG/ACT inhaler Inhale 2 puffs into the lungs every 6 (six) hours as needed for wheezing or shortness of breath. 1 Inhaler 11   amLODipine (NORVASC) 10 MG tablet Take 1 tablet (10 mg total) by mouth daily. 90 tablet 3   aspirin 81 MG EC tablet Take by mouth.     atorvastatin (LIPITOR) 20 MG tablet Take 1 tablet (20 mg total) by mouth daily. 90 tablet 2   B Complex-C (SUPER B COMPLEX PO) Take by mouth.     diclofenac sodium (VOLTAREN) 1 % GEL Apply 2 g topically 4 (four) times daily as needed. 100 g 5   Ferrous Sulfate (IRON PO) Take by mouth.     fexofenadine (ALLEGRA) 180 MG tablet Take by mouth.     FLOVENT HFA 110 MCG/ACT inhaler SMARTSIG:2 Puff(s) By Mouth Twice Daily     fluticasone (FLONASE) 50 MCG/ACT nasal spray Place 2 sprays into both nostrils daily. 45 g 3   ibuprofen (ADVIL,MOTRIN) 800 MG tablet Take 1 tablet (800 mg total) by mouth every 8 (eight) hours as needed. 90 tablet 3  iron polysaccharides (NU-IRON) 150 MG capsule Take 1 capsule (150 mg total) by mouth daily. 90 capsule 1   losartan (COZAAR) 50 MG tablet Take 2 tablets by mouth once daily 180 tablet 2   meloxicam (MOBIC) 15 MG tablet Take 1 tablet (15 mg total) by mouth daily as needed for pain. 90 tablet 1   Multiple Vitamin (MULTIVITAMIN) tablet Take 1 tablet by mouth daily.     NIFEdipine (ADALAT CC) 30 MG 24 hr tablet nifedipine ER 30 mg tablet,extended release     Omega-3 Fatty Acids (FISH OIL) 1000 MG CAPS Take 1 capsule by mouth daily.     triamcinolone (NASACORT) 55 MCG/ACT AERO nasal inhaler USE 2 SPRAY(S) INTRANASALLY ONCE DAILY 17 g 2   triamcinolone cream (KENALOG) 0.1 % APPLY  CREAM EXTERNALLY TO AFFECTED AREA TWICE DAILY AS NEEDED 80 g 1    triamcinolone cream (KENALOG) 0.1 % Apply topically.     VENTOLIN HFA 108 (90 Base) MCG/ACT inhaler INHALE 2 PUFFS BY MOUTH EVERY 6 HOURS AS NEEDED FOR WHEEZING FOR SHORTNESS OF BREATH 18 g 0   cetirizine (ZYRTEC) 10 MG tablet Take 1 tablet (10 mg total) by mouth daily. 90 tablet 3   No current facility-administered medications on file prior to visit.        ROS:  All others reviewed and negative.  Objective        PE:  BP 124/82 (BP Location: Right Arm, Patient Position: Sitting, Cuff Size: Normal)   Pulse 95   Temp 98.7 F (37.1 C) (Oral)   Ht 5\' 5"  (1.651 m)   Wt 289 lb (131.1 kg)   SpO2 97%   BMI 48.09 kg/m                 Constitutional: Pt appears in NAD               HENT: Head: NCAT.                Right Ear: External ear normal.                 Left Ear: External ear normal.                Eyes: . Pupils are equal, round, and reactive to light. Conjunctivae and EOM are normal               Nose: without d/c or deformity               Neck: Neck supple. Gross normal ROM               Cardiovascular: Normal rate and regular rhythm.                 Pulmonary/Chest: Effort normal and breath sounds decreased without rales but with few mild bialteral wheezing.                Abd:  Soft, NT, ND, + BS, no organomegaly               Neurological: Pt is alert. At baseline orientation, motor grossly intact               Skin: Skin is warm. No rashes, no other new lesions, LE edema - none               Psychiatric: Pt behavior is normal without agitation   Micro: none  Cardiac tracings I have personally interpreted today:  none  Pertinent Radiological findings (summarize): none   Lab Results  Component Value Date   WBC 7.1 05/03/2022   HGB 14.6 05/03/2022   HCT 44.8 05/03/2022   PLT 106.0 (L) 05/03/2022   GLUCOSE 82 05/03/2022   CHOL 152 05/03/2022   TRIG 162.0 (H) 05/03/2022   HDL 52.80 05/03/2022   LDLDIRECT 74.0 06/13/2018   LDLCALC 67 05/03/2022   ALT 36 (H)  05/03/2022   AST 36 05/03/2022   NA 140 05/03/2022   K 4.2 05/03/2022   CL 103 05/03/2022   CREATININE 0.95 05/03/2022   BUN 12 05/03/2022   CO2 28 05/03/2022   TSH 2.51 05/03/2022   INR 1.1 11/12/2012   HGBA1C 5.3 10/26/2022   Hemoglobin A1C 4.0 - 5.6 % 5.3 5.8 R   Assessment/Plan:  Iasha Mccalister is a 72 y.o. Black or African American [2] female with  has a past medical history of Allergic rhinitis, cause unspecified (05/01/2012), Asthma, Carpal tunnel syndrome (06/17/2014), GERD (gastroesophageal reflux disease) (02/11/2015), Hyperlipidemia (05/30/2011), Hypertension, Obesity, Thrombocytopenia ([facility]) (08/12/2015), and Tobacco abuse.  Asthma With mild exacerbation today, continue inhalers, depomedrol IM 80 mg  Hyperglycemia Lab Results  Component Value Date   HGBA1C 5.3 10/26/2022   Stable, pt to continue current medical treatment  - diet, wt control   Hyperlipidemia Lab Results  Component Value Date   LDLCALC 67 05/03/2022   Stable, pt to continue current statin lipitor 20 mg qd   Hypertension BP Readings from Last 3 Encounters:  10/26/22 124/82  05/03/22 126/74  10/31/21 138/80   Stable, pt to continue medical treatment norvasc 10 every day , losartan 100 every day   Tobacco use Pt counsled to quit, pt not ready  Allergic rhinitis Mild to mod, also for depomedrol I'm 80 mg, restart allegra 180 mg every day,  to f/u any worsening symptoms or concerns  Followup: Return if symptoms worsen or fail to improve.  Oliver Barre, MD 10/29/2022 4:26 PM [facility] [facility]

## 2022-10-26 NOTE — Patient Instructions (Signed)
You had the steroid shot today  Your A1c was done in the office today  Please continue all other medications as before, except we can stop the alprazolam as you mentioned  Please have the pharmacy call with any other refills you may need.  Please continue your efforts at being more active, low cholesterol diet, and weight control with more treadmill  Please keep your appointments with your specialists as you may have planned  Please make an Appointment to return in 6 months, or sooner if needed, also with Lab Appointment for testing done 3-5 days before at the FIRST FLOOR Lab (so this is for TWO appointments - please see the scheduling desk as you leave)

## 2022-10-29 ENCOUNTER — Encounter: Payer: Self-pay | Admitting: Internal Medicine

## 2022-10-29 NOTE — Assessment & Plan Note (Signed)
Lab Results  Component Value Date   LDLCALC 67 05/03/2022   Stable, pt to continue current statin lipitor 20 mg qd

## 2022-10-29 NOTE — Assessment & Plan Note (Signed)
With mild exacerbation today, continue inhalers, depomedrol IM 80 mg

## 2022-10-29 NOTE — Assessment & Plan Note (Signed)
Pt counsled to quit, pt not ready °

## 2022-10-29 NOTE — Assessment & Plan Note (Signed)
BP Readings from Last 3 Encounters:  10/26/22 124/82  05/03/22 126/74  10/31/21 138/80   Stable, pt to continue medical treatment norvasc 10 every day , losartan 100 every day

## 2022-10-29 NOTE — Assessment & Plan Note (Signed)
Mild to mod, also for depomedrol I'm 80 mg, restart allegra 180 mg every day,  to f/u any worsening symptoms or concerns

## 2022-10-29 NOTE — Assessment & Plan Note (Signed)
Lab Results  Component Value Date   HGBA1C 5.3 10/26/2022   Stable, pt to continue current medical treatment  - diet, wt control

## 2022-11-26 ENCOUNTER — Other Ambulatory Visit: Payer: Self-pay | Admitting: Internal Medicine

## 2022-11-27 ENCOUNTER — Other Ambulatory Visit: Payer: Self-pay

## 2023-03-26 ENCOUNTER — Other Ambulatory Visit: Payer: Self-pay | Admitting: Internal Medicine

## 2023-03-26 ENCOUNTER — Other Ambulatory Visit: Payer: Self-pay

## 2023-04-19 ENCOUNTER — Other Ambulatory Visit: Payer: Self-pay | Admitting: Internal Medicine

## 2023-04-19 NOTE — Telephone Encounter (Signed)
 Copied from CRM [id number]. Topic: Clinical - Medication Refill >> Apr 19, 2023  4:37 PM Evie NOVAK wrote: Most Recent Primary Care Visit:  Provider: NORLEEN LYNWOOD ORN  Department: [facility]  Visit Type: OFFICE VISIT  Date: 10/26/2022  Medication: FLOVENT  HFA 110 MCG/ACT inhaler  Has the patient contacted their pharmacy? Yes (Agent: If no, request that the patient contact the pharmacy for the refill. If patient does not wish to contact the pharmacy document the reason why and proceed with request.) (Agent: If yes, when and what did the pharmacy advise?)  Is this the correct pharmacy for this prescription? Yes If no, delete pharmacy and type the correct one.  This is the patient's preferred pharmacy:  [facility] [address], [place] - [address] [place] [address] Phone: [phone] Fax: [phone]     Has the prescription been filled recently? Yes  Is the patient out of the medication? Yes  Has the patient been seen for an appointment in the last year OR does the patient have an upcoming appointment? Yes  Can we respond through MyChart? Yes  Agent: Please be advised that Rx refills may take up to 3 business days. We ask that you follow-up with your pharmacy.

## 2023-04-20 ENCOUNTER — Other Ambulatory Visit: Payer: Self-pay

## 2023-04-20 MED ORDER — FLOVENT HFA 110 MCG/ACT IN AERO: 2.0000 | INHALATION_SPRAY | Freq: Two times a day (BID) | RESPIRATORY_TRACT | 0 refills | Status: AC

## 2023-04-24 ENCOUNTER — Encounter: Payer: Self-pay | Admitting: Internal Medicine

## 2023-04-24 ENCOUNTER — Ambulatory Visit (INDEPENDENT_AMBULATORY_CARE_PROVIDER_SITE_OTHER): Payer: Medicare HMO | Admitting: Internal Medicine

## 2023-04-24 VITALS — BP 124/74 | HR 105 | Temp 98.8°F | Ht 65.0 in | Wt 289.0 lb

## 2023-04-24 DIAGNOSIS — T7840XA Allergy, unspecified, initial encounter: Secondary | ICD-10-CM

## 2023-04-24 DIAGNOSIS — R739 Hyperglycemia, unspecified: Secondary | ICD-10-CM

## 2023-04-24 DIAGNOSIS — Z0001 Encounter for general adult medical examination with abnormal findings: Secondary | ICD-10-CM

## 2023-04-24 DIAGNOSIS — J45909 Unspecified asthma, uncomplicated: Secondary | ICD-10-CM

## 2023-04-24 DIAGNOSIS — I10 Essential (primary) hypertension: Secondary | ICD-10-CM

## 2023-04-24 DIAGNOSIS — Z720 Tobacco use: Secondary | ICD-10-CM | POA: Diagnosis not present

## 2023-04-24 DIAGNOSIS — J309 Allergic rhinitis, unspecified: Secondary | ICD-10-CM | POA: Diagnosis not present

## 2023-04-24 DIAGNOSIS — E559 Vitamin D deficiency, unspecified: Secondary | ICD-10-CM | POA: Diagnosis not present

## 2023-04-24 DIAGNOSIS — Z1211 Encounter for screening for malignant neoplasm of colon: Secondary | ICD-10-CM | POA: Diagnosis not present

## 2023-04-24 DIAGNOSIS — D509 Iron deficiency anemia, unspecified: Secondary | ICD-10-CM

## 2023-04-24 DIAGNOSIS — E7800 Pure hypercholesterolemia, unspecified: Secondary | ICD-10-CM | POA: Diagnosis not present

## 2023-04-24 DIAGNOSIS — E538 Deficiency of other specified B group vitamins: Secondary | ICD-10-CM

## 2023-04-24 LAB — URINALYSIS, ROUTINE W REFLEX MICROSCOPIC
Bilirubin Urine: NEGATIVE
Hgb urine dipstick: NEGATIVE
Ketones, ur: NEGATIVE
Leukocytes,Ua: NEGATIVE
Nitrite: NEGATIVE
Specific Gravity, Urine: 1.015 (ref 1.000–1.030)
Total Protein, Urine: NEGATIVE
Urine Glucose: NEGATIVE
Urobilinogen, UA: 0.2 (ref 0.0–1.0)
pH: 6 (ref 5.0–8.0)

## 2023-04-24 LAB — BASIC METABOLIC PANEL
BUN: 13 mg/dL (ref 6–23)
CO2: 27 meq/L (ref 19–32)
Calcium: 9.3 mg/dL (ref 8.4–10.5)
Chloride: 103 meq/L (ref 96–112)
Creatinine, Ser: 0.95 mg/dL (ref 0.40–1.20)
GFR: 59.1 mL/min — ABNORMAL LOW (ref >60.00)
Glucose, Bld: 85 mg/dL (ref 70–99)
Potassium: 4.4 meq/L (ref 3.5–5.1)
Sodium: 140 meq/L (ref 135–145)

## 2023-04-24 LAB — CBC WITH DIFFERENTIAL/PLATELET
Basophils Absolute: 0.1 10*3/uL (ref 0.0–0.1)
Basophils Relative: 0.7 % (ref 0.0–3.0)
Eosinophils Absolute: 0.2 10*3/uL (ref 0.0–0.7)
Eosinophils Relative: 2.4 % (ref 0.0–5.0)
HCT: 43.3 % (ref 36.0–46.0)
Hemoglobin: 13.5 g/dL (ref 12.0–15.0)
Lymphocytes Relative: 25.8 % (ref 12.0–46.0)
Lymphs Abs: 1.9 10*3/uL (ref 0.7–4.0)
MCHC: 31.3 g/dL (ref 30.0–36.0)
MCV: 86.4 fL (ref 78.0–100.0)
Monocytes Absolute: 0.6 10*3/uL (ref 0.1–1.0)
Monocytes Relative: 8.9 % (ref 3.0–12.0)
Neutro Abs: 4.5 10*3/uL (ref 1.4–7.7)
Neutrophils Relative %: 62.2 % (ref 43.0–77.0)
Platelets: 118 10*3/uL — ABNORMAL LOW (ref 150.0–400.0)
RBC: 5.01 Mil/uL (ref 3.87–5.11)
RDW: 16 % — ABNORMAL HIGH (ref 11.5–15.5)
WBC: 7.3 10*3/uL (ref 4.0–10.5)

## 2023-04-24 LAB — HEPATIC FUNCTION PANEL
ALT: 23 U/L (ref 0–35)
AST: 29 U/L (ref 0–37)
Albumin: 3.8 g/dL (ref 3.5–5.2)
Alkaline Phosphatase: 75 U/L (ref 39–117)
Bilirubin, Direct: 0.1 mg/dL (ref 0.0–0.3)
Total Bilirubin: 0.6 mg/dL (ref 0.2–1.2)
Total Protein: 7.6 g/dL (ref 6.0–8.3)

## 2023-04-24 LAB — LIPID PANEL
Cholesterol: 149 mg/dL (ref 0–200)
HDL: 48.6 mg/dL (ref >39.00)
LDL Cholesterol: 74 mg/dL (ref 0–99)
NonHDL: 100
Total CHOL/HDL Ratio: 3
Triglycerides: 128 mg/dL (ref 0.0–149.0)
VLDL: 25.6 mg/dL (ref 0.0–40.0)

## 2023-04-24 LAB — VITAMIN B12: Vitamin B-12: 1031 pg/mL — ABNORMAL HIGH (ref 211–911)

## 2023-04-24 LAB — VITAMIN D 25 HYDROXY (VIT D DEFICIENCY, FRACTURES): VITD: 41.55 ng/mL (ref 30.00–100.00)

## 2023-04-24 LAB — HEMOGLOBIN A1C: Hgb A1c MFr Bld: 5.9 % (ref 4.6–6.5)

## 2023-04-24 LAB — TSH: TSH: 2.98 u[IU]/mL (ref 0.35–5.50)

## 2023-04-24 MED ORDER — METHYLPREDNISOLONE ACETATE 80 MG/ML IJ SUSP
80.0000 mg | Freq: Once | INTRAMUSCULAR | Status: AC
  Administered 2023-04-24: 80 mg via INTRAMUSCULAR

## 2023-04-24 NOTE — Assessment & Plan Note (Signed)
 Pt counsled to quit, pt not ready

## 2023-04-24 NOTE — Assessment & Plan Note (Signed)
No recent overt blood loss, for f/u lab ?

## 2023-04-24 NOTE — Assessment & Plan Note (Signed)
 BP Readings from Last 3 Encounters:  04/24/23 124/74  10/26/22 124/82  05/03/22 126/74   Stable, pt to continue medical treatment norvasc 0 every day, losartan 50 every day, adalat cc 30 qd

## 2023-04-24 NOTE — Progress Notes (Signed)
 Patient ID: Bethany Lopez, female   DOB: 12/19/1950, 73 y.o.   MRN: [id number]         Chief Complaint:: wellness exam and hld, smoker, htn, iron  def anemia, hypergycemia, asthma       HPI:  Bethany Lopez is a 73 y.o. female here for wellness exam; decliens immunizations and colonoscopy, o/w up to date , and willing for cologuard; has been smoking, not ready to quit.                 Also Pt denies chest pain, increased sob or doe, wheezing, orthopnea, PND, increased LE swelling, palpitations, dizziness or syncope.   Pt denies polydipsia, polyuria, or new focal neuro s/s.    Pt denies fever, wt loss, night sweats, loss of appetite, or other constitutional symptoms  Does have several wks ongoing nasal allergy symptoms with clearish congestion, itch and sneezing, without fever, pain, ST, cough, swelling or wheezing.     Wt Readings from Last 3 Encounters:  04/24/23 289 lb (131.1 kg)  10/26/22 289 lb (131.1 kg)  05/03/22 283 lb (128.4 kg)   BP Readings from Last 3 Encounters:  04/24/23 124/74  10/26/22 124/82  05/03/22 126/74   Immunization History  Administered Date(s) Administered   Fluad Quad(high Dose 65+) 05/03/2022   Influenza, High Dose Seasonal PF 02/15/2017   Influenza,inj,Quad PF,6+ Mos 02/11/2015, 02/10/2016, 02/05/2020   Influenza,inj,quad, With Preservative 12/31/2018   Influenza-Unspecified 01/01/2014, 02/11/2015, 02/10/2016, 12/28/2020, 03/05/2023   PFIZER Comirnaty(Gray Top)Covid-19 Tri-Sucrose Vaccine 08/12/2019, 09/02/2019   PFIZER(Purple Top)SARS-COV-2 Vaccination 03/30/2020, 03/02/2021, 10/19/2021   Pneumococcal Conjugate-13 02/18/2015   Pneumococcal Polysaccharide-23 08/12/2015   Tdap 12/06/2011   Zoster Recombinant(Shingrix) 12/31/2018, 02/24/2019   Zoster, Live 04/30/2012   Health Maintenance Due  Topic Date Due   Medicare Annual Wellness (AWV)  Never done   Colonoscopy  12/11/2020   DTaP/Tdap/Td (2 - Td or Tdap) 12/05/2021   COVID-19  Vaccine (6 - 2024-25 season) 12/17/2022      Past Medical History:  Diagnosis Date   Allergic rhinitis, cause unspecified 05/01/2012   Asthma    Carpal tunnel syndrome 06/17/2014   GERD (gastroesophageal reflux disease) 02/11/2015   Hyperlipidemia 05/30/2011   NEC/NOS     Hypertension    Obesity    Thrombocytopenia (HCC) 08/12/2015   Tobacco abuse    Past Surgical History:  Procedure Laterality Date   arthroscopic right knee     5-6 yrs ago   TUBAL LIGATION      reports that she has been smoking cigarettes. She has never used smokeless tobacco. She reports current alcohol use. She reports that she does not use drugs. family history includes Diabetes in her brother, mother, and sister; Heart disease in her father; Kidney disease in her mother. Allergies  Allergen Reactions   Lodine [Etodolac] Other (See Comments)    GI upset   Penicillins    Current Outpatient Medications on File Prior to Visit  Medication Sig Dispense Refill   acetaminophen  (TYLENOL ) 500 MG tablet Take by mouth.     albuterol  (PROVENTIL  HFA;VENTOLIN  HFA) 108 (90 Base) MCG/ACT inhaler Inhale 2 puffs into the lungs every 6 (six) hours as needed for wheezing or shortness of breath. 1 Inhaler 11   amLODipine  (NORVASC ) 10 MG tablet Take 1 tablet (10 mg total) by mouth daily. 90 tablet 3   aspirin  81 MG EC tablet Take by mouth.     atorvastatin  (LIPITOR) 20 MG tablet Take 1 tablet (20 mg total) by mouth  daily. 90 tablet 2   B Complex-C (SUPER B COMPLEX PO) Take by mouth.     diclofenac  sodium (VOLTAREN ) 1 % GEL Apply 2 g topically 4 (four) times daily as needed. 100 g 5   Ferrous Sulfate (IRON  PO) Take by mouth.     fexofenadine  (ALLEGRA ) 180 MG tablet Take by mouth.     FLOVENT  HFA 110 MCG/ACT inhaler Inhale 2 puffs into the lungs 2 (two) times daily. 1 each 0   fluticasone  (FLONASE ) 50 MCG/ACT nasal spray Place 2 sprays into both nostrils daily. 45 g 3   ibuprofen  (ADVIL ,MOTRIN ) 800 MG tablet Take 1 tablet (800 mg  total) by mouth every 8 (eight) hours as needed. 90 tablet 3   iron  polysaccharides (NU-IRON ) 150 MG capsule Take 1 capsule (150 mg total) by mouth daily. 90 capsule 1   losartan  (COZAAR ) 50 MG tablet Take 2 tablets by mouth once daily 180 tablet 2   meloxicam  (MOBIC ) 15 MG tablet TAKE 1 TABLET BY MOUTH ONCE DAILY AS NEEDED FOR PAIN 90 tablet 0   Multiple Vitamin (MULTIVITAMIN) tablet Take 1 tablet by mouth daily.     NIFEdipine (ADALAT CC) 30 MG 24 hr tablet nifedipine ER 30 mg tablet,extended release     Omega-3 Fatty Acids (FISH OIL) 1000 MG CAPS Take 1 capsule by mouth daily.     triamcinolone  (NASACORT ) 55 MCG/ACT AERO nasal inhaler USE 2 SPRAY(S) INTRANASALLY ONCE DAILY 17 g 2   triamcinolone  cream (KENALOG ) 0.1 % APPLY  CREAM EXTERNALLY TO AFFECTED AREA TWICE DAILY AS NEEDED 80 g 1   triamcinolone  cream (KENALOG ) 0.1 % Apply topically.     VENTOLIN  HFA 108 (90 Base) MCG/ACT inhaler INHALE 2 PUFFS BY MOUTH EVERY 6 HOURS AS NEEDED FOR WHEEZING FOR SHORTNESS OF BREATH 18 g 0   cetirizine  (ZYRTEC ) 10 MG tablet Take 1 tablet (10 mg total) by mouth daily. 90 tablet 3   No current facility-administered medications on file prior to visit.        ROS:  All others reviewed and negative.  Objective        PE:  BP 124/74 (BP Location: Right Arm, Patient Position: Sitting, Cuff Size: Normal)   Pulse (!) 105   Temp 98.8 F (37.1 C) (Oral)   Ht 5' 5 (1.651 m)   Wt 289 lb (131.1 kg)   SpO2 95%   BMI 48.09 kg/m                 Constitutional: Pt appears in NAD               HENT: Head: NCAT.                Right Ear: External ear normal.                 Left Ear: External ear normal.                Eyes: . Pupils are equal, round, and reactive to light. Conjunctivae and EOM are normal               Nose: without d/c or deformity               Neck: Neck supple. Gross normal ROM               Cardiovascular: Normal rate and regular rhythm.                 Pulmonary/Chest: Effort normal  and  breath sounds without rales or wheezing.                Abd:  Soft, NT, ND, + BS, no organomegaly               Neurological: Pt is alert. At baseline orientation, motor grossly intact               Skin: Skin is warm. No rashes, no other new lesions, LE edema - none               Psychiatric: Pt behavior is normal without agitation   Micro: none  Cardiac tracings I have personally interpreted today:  none  Pertinent Radiological findings (summarize): none   Lab Results  Component Value Date   WBC 7.3 04/24/2023   HGB 13.5 04/24/2023   HCT 43.3 04/24/2023   PLT 118.0 (L) 04/24/2023   GLUCOSE 85 04/24/2023   CHOL 149 04/24/2023   TRIG 128.0 04/24/2023   HDL 48.60 04/24/2023   LDLDIRECT 74.0 06/13/2018   LDLCALC 74 04/24/2023   ALT 23 04/24/2023   AST 29 04/24/2023   NA 140 04/24/2023   K 4.4 04/24/2023   CL 103 04/24/2023   CREATININE 0.95 04/24/2023   BUN 13 04/24/2023   CO2 27 04/24/2023   TSH 2.98 04/24/2023   INR 1.1 11/12/2012   HGBA1C 5.9 04/24/2023   Assessment/Plan:  Rosell Khouri is a 73 y.o. Black or African American [2] female with  has a past medical history of Allergic rhinitis, cause unspecified (05/01/2012), Asthma, Carpal tunnel syndrome (06/17/2014), GERD (gastroesophageal reflux disease) (02/11/2015), Hyperlipidemia (05/30/2011), Hypertension, Obesity, Thrombocytopenia (HCC) (08/12/2015), and Tobacco abuse.  Encounter for preventative adult health care exam with abnormal findings Age and sex appropriate education and counseling updated with regular exercise and diet Referrals for preventative services - for cologuard Immunizations addressed - declines immunizations Smoking counseling  - pt counseled to quit, pt not ready Evidence for depression or other mood disorder - none significant Most recent labs reviewed. I have personally reviewed and have noted: 1) the patient's medical and social history 2) The patient's current medications and  supplements 3) The patient's height, weight, and BMI have been recorded in the chart   Hyperlipidemia Lab Results  Component Value Date   LDLCALC 74 04/24/2023   Uncontrolled, for lower chol diet,, pt to continue current statin lipitor 20 every day as delcines change for now   Tobacco use Pt counsled to quit, pt not ready  Hypertension BP Readings from Last 3 Encounters:  04/24/23 124/74  10/26/22 124/82  05/03/22 126/74   Stable, pt to continue medical treatment norvasc  0 every day, losartan  50 every day, adalat cc 30 qd   Anemia, iron  deficiency No recent overt blood loss, for f/u lab  Asthma Stable, cont inhaler prn  Hyperglycemia Lab Results  Component Value Date   HGBA1C 5.9 04/24/2023   Stable, pt to continue current medical treatment  - diet, wt control   Allergic rhinitis Mild to mod, for depomedrol 80 mg IM,  to f/u any worsening symptoms or concerns  Followup: Return in about 6 months (around 10/22/2023).  Lynwood Rush, MD 04/24/2023 9:18 PM [facility]  Primary Care - [facility]

## 2023-04-24 NOTE — Assessment & Plan Note (Signed)
Mild to mod, for depomedrol 80 mg IM, to f/u any worsening symptoms or concerns 

## 2023-04-24 NOTE — Assessment & Plan Note (Signed)
 Age and sex appropriate education and counseling updated with regular exercise and diet Referrals for preventative services - for cologuard Immunizations addressed - declines immunizations Smoking counseling  - pt counseled to quit, pt not ready Evidence for depression or other mood disorder - none significant Most recent labs reviewed. I have personally reviewed and have noted: 1) the patient's medical and social history 2) The patient's current medications and supplements 3) The patient's height, weight, and BMI have been recorded in the chart

## 2023-04-24 NOTE — Assessment & Plan Note (Signed)
 Lab Results  Component Value Date   HGBA1C 5.9 04/24/2023   Stable, pt to continue current medical treatment  - diet, wt control

## 2023-04-24 NOTE — Patient Instructions (Signed)
 You had the steroid shot today  Please continue all other medications as before, and refills have been done if requested.  Please have the pharmacy call with any other refills you may need.  Please continue your efforts at being more active, low cholesterol diet, and weight control.  You are otherwise up to date with prevention measures today.  Please keep your appointments with your specialists as you may have planned  You will be contacted regarding the referral for: cologuard  Please go to the LAB at the blood drawing area for the tests to be done  You will be contacted by phone if any changes need to be made immediately.  Otherwise, you will receive a letter about your results with an explanation, but please check with MyChart first.  Please make an Appointment to return in 6 months, or sooner if needed

## 2023-04-24 NOTE — Assessment & Plan Note (Signed)
 Stable , cont inhaler prn

## 2023-04-24 NOTE — Assessment & Plan Note (Signed)
 Lab Results  Component Value Date   LDLCALC 74 04/24/2023   Uncontrolled, for lower chol diet,, pt to continue current statin lipitor 20 every day as delcines change for now

## 2023-04-25 ENCOUNTER — Other Ambulatory Visit (HOSPITAL_COMMUNITY): Payer: Self-pay

## 2023-04-25 ENCOUNTER — Encounter: Payer: Self-pay | Admitting: Internal Medicine

## 2023-04-25 ENCOUNTER — Telehealth: Payer: Self-pay

## 2023-04-25 NOTE — Telephone Encounter (Signed)
 Pharmacy Patient Advocate Encounter  Received notification from [employer] that Prior Authorization for Flovent  HFA 110MCG/ACT aerosol has been APPROVED from 04/18/2023 to 04/16/2024. Ran test claim, Copay is $253.47. This test claim was processed through [facility]- copay amounts may vary at other pharmacies due to pharmacy/plan contracts, or as the patient moves through the different stages of their insurance plan.  **PATIENT CURRENTLY HAS A $250 DEDUCTIBLE.**   PA #/Case ID/Reference #: PA Case ID #: [id number]

## 2023-04-25 NOTE — Telephone Encounter (Signed)
 Pharmacy Patient Advocate Encounter   Received notification from CoverMyMeds that prior authorization for Flovent  HFA 110MCG/ACT aerosol is required/requested.   Insurance verification completed.   The patient is insured through [place] .   Per test claim: PA required; PA submitted to above mentioned insurance via CoverMyMeds Key/confirmation #/EOC [id number] Status is pending

## 2023-06-26 ENCOUNTER — Ambulatory Visit (INDEPENDENT_AMBULATORY_CARE_PROVIDER_SITE_OTHER): Payer: Medicare HMO

## 2023-06-26 VITALS — Ht 65.0 in | Wt 289.0 lb

## 2023-06-26 DIAGNOSIS — Z780 Asymptomatic menopausal state: Secondary | ICD-10-CM | POA: Diagnosis not present

## 2023-06-26 DIAGNOSIS — Z0000 Encounter for general adult medical examination without abnormal findings: Secondary | ICD-10-CM

## 2023-06-26 NOTE — Progress Notes (Signed)
 Subjective:   Bethany Lopez is a 73 y.o. who presents for a Medicare Wellness preventive visit.  Visit Complete: Virtual I connected with  Nathen May on 06/26/23 by a audio enabled telemedicine application and verified that I am speaking with the correct person using two identifiers.  Patient Location: Home  Provider Location: Home Office  I discussed the limitations of evaluation and management by telemedicine. The patient expressed understanding and agreed to proceed.  Vital Signs: Because this visit was a virtual/telehealth visit, some criteria may be missing or patient reported. Any vitals not documented were not able to be obtained and vitals that have been documented are patient reported.  VideoDeclined- This patient declined Librarian, academic. Therefore the visit was completed with audio only.  AWV Questionnaire: No: Patient Medicare AWV questionnaire was not completed prior to this visit.  Cardiac Risk Factors include: advanced age (>84men, >13 women);hypertension;dyslipidemia;obesity (BMI >30kg/m2)     Objective:    Today's Vitals   06/26/23 1540 06/26/23 1541  Weight: 289 lb (131.1 kg)   Height: 5\' 5"  (1.651 m)   PainSc:  2    Body mass index is 48.09 kg/m.     06/26/2023    3:48 PM 08/30/2021    1:30 PM 03/03/2016    7:13 AM  Advanced Directives  Does Patient Have a Medical Advance Directive? No No No    Current Medications (verified) Outpatient Encounter Medications as of 06/26/2023  Medication Sig   acetaminophen (TYLENOL) 500 MG tablet Take by mouth.   albuterol (PROVENTIL HFA;VENTOLIN HFA) 108 (90 Base) MCG/ACT inhaler Inhale 2 puffs into the lungs every 6 (six) hours as needed for wheezing or shortness of breath.   amLODipine (NORVASC) 10 MG tablet Take 1 tablet (10 mg total) by mouth daily.   aspirin 81 MG EC tablet Take by mouth.   atorvastatin (LIPITOR) 20 MG tablet Take 1 tablet (20 mg total) by  mouth daily.   B Complex-C (SUPER B COMPLEX PO) Take by mouth.   diclofenac sodium (VOLTAREN) 1 % GEL Apply 2 g topically 4 (four) times daily as needed.   Ferrous Sulfate (IRON PO) Take by mouth.   fexofenadine (ALLEGRA) 180 MG tablet Take by mouth.   FLOVENT HFA 110 MCG/ACT inhaler Inhale 2 puffs into the lungs 2 (two) times daily.   fluticasone (FLONASE) 50 MCG/ACT nasal spray Place 2 sprays into both nostrils daily.   ibuprofen (ADVIL,MOTRIN) 800 MG tablet Take 1 tablet (800 mg total) by mouth every 8 (eight) hours as needed.   iron polysaccharides (NU-IRON) 150 MG capsule Take 1 capsule (150 mg total) by mouth daily.   losartan (COZAAR) 50 MG tablet Take 2 tablets by mouth once daily   meloxicam (MOBIC) 15 MG tablet TAKE 1 TABLET BY MOUTH ONCE DAILY AS NEEDED FOR PAIN   Multiple Vitamin (MULTIVITAMIN) tablet Take 1 tablet by mouth daily.   NIFEdipine (ADALAT CC) 30 MG 24 hr tablet nifedipine ER 30 mg tablet,extended release   Omega-3 Fatty Acids (FISH OIL) 1000 MG CAPS Take 1 capsule by mouth daily.   triamcinolone (NASACORT) 55 MCG/ACT AERO nasal inhaler USE 2 SPRAY(S) INTRANASALLY ONCE DAILY   triamcinolone cream (KENALOG) 0.1 % APPLY  CREAM EXTERNALLY TO AFFECTED AREA TWICE DAILY AS NEEDED   triamcinolone cream (KENALOG) 0.1 % Apply topically.   VENTOLIN HFA 108 (90 Base) MCG/ACT inhaler INHALE 2 PUFFS BY MOUTH EVERY 6 HOURS AS NEEDED FOR WHEEZING FOR SHORTNESS OF BREATH  cetirizine (ZYRTEC) 10 MG tablet Take 1 tablet (10 mg total) by mouth daily.   No facility-administered encounter medications on file as of 06/26/2023.    Allergies (verified) Lodine [etodolac] and Penicillins   History: Past Medical History:  Diagnosis Date   Allergic rhinitis, cause unspecified 05/01/2012   Asthma    Carpal tunnel syndrome 06/17/2014   GERD (gastroesophageal reflux disease) 02/11/2015   Hyperlipidemia 05/30/2011   NEC/NOS     Hypertension    Obesity    Thrombocytopenia (HCC) 08/12/2015    Tobacco abuse    Past Surgical History:  Procedure Laterality Date   arthroscopic right knee     5-6 yrs ago   TUBAL LIGATION     Family History  Problem Relation Age of Onset   Diabetes Mother    Kidney disease Mother        renal failure   Heart disease Father        congestive heart failure   Diabetes Sister    Diabetes Brother    Breast cancer Neg Hx    Social History   Socioeconomic History   Marital status: Married    Spouse name: Not on file   Number of children: Not on file   Years of education: Not on file   Highest education level: Not on file  Occupational History   Occupation: RETIRED  Tobacco Use   Smoking status: Every Day    Current packs/day: 0.50    Types: Cigarettes   Smokeless tobacco: Never   Tobacco comments:    smokes 1 pack every two days  Vaping Use   Vaping status: Never Used  Substance and Sexual Activity   Alcohol use: Yes    Comment: occasionally has wine   Drug use: No   Sexual activity: Never    Birth control/protection: None  Other Topics Concern   Not on file  Social History Narrative   Lives in [place] with husband   . Works - Therapist, music for [employer]. No children. Wears seatbeltDiet: regular, healthyExercise: none currently   Social Drivers of Corporate investment banker Strain: Medium Risk (06/26/2023)   Overall Financial Resource Strain (CARDIA)    Difficulty of Paying Living Expenses: Somewhat hard  Food Insecurity: No Food Insecurity (06/26/2023)   Hunger Vital Sign    Worried About Running Out of Food in the Last Year: Never true    Ran Out of Food in the Last Year: Never true  Transportation Needs: No Transportation Needs (06/26/2023)   PRAPARE - Administrator, Civil Service (Medical): No    Lack of Transportation (Non-Medical): No  Physical Activity: Inactive (06/26/2023)   Exercise Vital Sign    Days of Exercise per Week: 0 days    Minutes of Exercise per Session: 0 min  Stress: No Stress Concern  Present (06/26/2023)   [employer] of Occupational Health - Occupational Stress Questionnaire    Feeling of Stress : Not at all  Social Connections: Moderately Integrated (06/26/2023)   Social Connection and Isolation Panel [NHANES]    Frequency of Communication with Friends and Family: Twice a week    Frequency of Social Gatherings with Friends and Family: Never    Attends Religious Services: More than 4 times per year    Active Member of [employer] or Organizations: Yes    Attends Banker Meetings: Never    Marital Status: Married    Tobacco Counseling Ready to quit: Not Answered Counseling given: Not Answered  Tobacco comments: smokes 1 pack every two days    Clinical Intake:  Pre-visit preparation completed: Yes  Pain : 0-10 Pain Score: 2  Pain Location: Foot Pain Orientation: Left Pain Descriptors / Indicators: Aching, Discomfort Pain Onset: 1 to 4 weeks ago Pain Frequency: Intermittent Effect of Pain on Daily Activities: when she is standing on it     BMI - recorded: 48.09 Nutritional Status: BMI > 30  Obese Nutritional Risks: None Diabetes: No  How often do you need to have someone help you when you read instructions, pamphlets, or other written materials from your doctor or pharmacy?: 1 - Never  Interpreter Needed?: No  Information entered by :: Max[employer], RMA   Activities of Daily Living     06/26/2023    3:45 PM  In your present state of health, do you have any difficulty performing the following activities:  Hearing? 0  Vision? 0  Difficulty concentrating or making decisions? 0  Walking or climbing stairs? 0  Dressing or bathing? 0  Doing errands, shopping? 0  Comment her husband  drives her  Preparing Food and eating ? N  Using the Toilet? N  In the past six months, have you accidently leaked urine? N  Do you have problems with loss of bowel control? N  Managing your Medications? N  Managing your Finances? N  Housekeeping or  managing your Housekeeping? N    Patient Care Team: Corwin Levins, MD as PCP - General (Internal Medicine)  Indicate any recent Medical Services you may have received from other than [facility] providers in the past year (date may be approximate).     Assessment:   This is a routine wellness examination for Nickerson.  Hearing/Vision screen Hearing Screening - Comments:: Denies hearing difficulties   Vision Screening - Comments:: Wears eyeglasses   Goals Addressed               This Visit's Progress     Patient Stated (pt-stated)        Would like to start exercising more/walking       Depression Screen     06/26/2023    3:54 PM 04/24/2023   10:35 AM 10/26/2022   10:16 AM 05/03/2022   11:13 AM 05/03/2022   10:39 AM 10/31/2021   10:39 AM 05/03/2021   11:32 AM  PHQ 2/9 Scores  PHQ - 2 Score 0 0 0 0 0 0 1  PHQ- 9 Score 0     0     Fall Risk     06/26/2023    3:49 PM 04/24/2023   10:35 AM 10/26/2022   10:16 AM 05/03/2022   11:11 AM 05/03/2022   10:39 AM  Fall Risk   Falls in the past year? 0 0 0 0 0  Number falls in past yr: 0 0 0 0 0  Injury with Fall? 0 0 0 0 0  Risk for fall due to : No Fall Risks No Fall Risks No Fall Risks  No Fall Risks  Follow up Falls evaluation completed;Falls prevention discussed Falls evaluation completed Falls evaluation completed  Falls evaluation completed    MEDICARE RISK AT HOME:  Medicare Risk at Home Any stairs in or around the home?: No Home free of loose throw rugs in walkways, pet beds, electrical cords, etc?: Yes Adequate lighting in your home to reduce risk of falls?: Yes Life alert?: No Use of a cane, walker or w/c?: No Grab bars in the bathroom?: Yes Shower  chair or bench in shower?: No Elevated toilet seat or a handicapped toilet?: No  TIMED UP AND GO:  Was the test performed?  No  Cognitive Function: 6CIT completed        06/26/2023    3:49 PM  6CIT Screen  What Year? 0 points  What month? 0 points  What time? 0  points  Count back from 20 0 points  Months in reverse 4 points  Repeat phrase 0 points  Total Score 4 points    Immunizations Immunization History  Administered Date(s) Administered   Fluad Quad(high Dose 65+) 05/03/2022   Influenza, High Dose Seasonal PF 02/15/2017   Influenza,inj,Quad PF,6+ Mos 02/11/2015, 02/10/2016, 02/05/2020   Influenza,inj,quad, With Preservative 12/31/2018   Influenza-Unspecified 01/01/2014, 02/11/2015, 02/10/2016, 12/28/2020, 03/05/2023   PFIZER Comirnaty(Gray Top)Covid-19 Tri-Sucrose Vaccine 08/12/2019, 09/02/2019   PFIZER(Purple Top)SARS-COV-2 Vaccination 03/30/2020, 03/02/2021, 10/19/2021   Pneumococcal Conjugate-13 02/18/2015   Pneumococcal Polysaccharide-23 08/12/2015   Tdap 12/06/2011   Zoster Recombinant(Shingrix) 12/31/2018, 02/24/2019   Zoster, Live 04/30/2012    Screening Tests Health Maintenance  Topic Date Due   Colonoscopy  12/11/2020   MAMMOGRAM  05/20/2021   DTaP/Tdap/Td (2 - Td or Tdap) 12/05/2021   COVID-19 Vaccine (6 - 2024-25 season) 12/17/2022   Medicare Annual Wellness (AWV)  06/25/2024   Pneumonia Vaccine 12+ Years old  Completed   INFLUENZA VACCINE  Completed   DEXA SCAN  Completed   Hepatitis C Screening  Completed   Zoster Vaccines- Shingrix  Completed   HPV VACCINES  Aged Out    Health Maintenance  Health Maintenance Due  Topic Date Due   Colonoscopy  12/11/2020   MAMMOGRAM  05/20/2021   DTaP/Tdap/Td (2 - Td or Tdap) 12/05/2021   COVID-19 Vaccine (6 - 2024-25 season) 12/17/2022   Health Maintenance Items Addressed: Mammogram ordered, DEXA ordered, Cologuard Ordered, See Nurse Notes  Additional Screening:  Vision Screening: Recommended annual ophthalmology exams for early detection of glaucoma and other disorders of the eye.  Dental Screening: Recommended annual dental exams for proper oral hygiene  Community Resource Referral / Chronic Care Management: CRR required this visit?  No   CCM required this  visit?  No     Plan:     I have personally reviewed and noted the following in the patient's chart:   Medical and social history Use of alcohol, tobacco or illicit drugs  Current medications and supplements including opioid prescriptions. Patient is not currently taking opioid prescriptions. Functional ability and status Nutritional status Physical activity Advanced directives List of other physicians Hospitalizations, surgeries, and ER visits in previous 12 months Vitals Screenings to include cognitive, depression, and falls Referrals and appointments  In addition, I have reviewed and discussed with patient certain preventive protocols, quality metrics, and best practice recommendations. A written personalized care plan for preventive services as well as general preventive health recommendations were provided to patient.     Ivana Nicastro L Jakyren Fluegge, CMA   06/26/2023   After Visit Summary: (Mail) Due to this being a telephonic visit, the after visit summary with patients personalized plan was offered to patient via mail   Notes: Please refer to Routing Comments.

## 2023-06-26 NOTE — Patient Instructions (Addendum)
 Bethany Lopez , Thank you for taking time to come for your Medicare Wellness Visit. I appreciate your ongoing commitment to your health goals. Please review the following plan we discussed and let me know if I can assist you in the future.   Referrals/Orders/Follow-Ups/Clinician Recommendations: It was nice talking to you today.  Aim for 30 minutes of exercise or brisk walking, 6-8 glasses of water, and 5 servings of fruits and vegetables each day. You are due for a Tetanus vaccine.  You have an order for:   [x]   3D Mammogram  [x]   Bone Density     Please call for appointment:  [facility]  [address] [place] [address] [phone]   Make sure to wear two-piece clothing.  No lotions, powders, or deodorants the day of the appointment. Make sure to bring picture ID and insurance card.  Bring list of medications you are currently taking including any supplements.    This is a list of the screening recommended for you and due dates:  Health Maintenance  Topic Date Due   Medicare Annual Wellness Visit  Never done   Colon Cancer Screening  12/11/2020   Mammogram  05/20/2021   DTaP/Tdap/Td vaccine (2 - Td or Tdap) 12/05/2021   COVID-19 Vaccine (6 - 2024-25 season) 12/17/2022   Pneumonia Vaccine  Completed   Flu Shot  Completed   DEXA scan (bone density measurement)  Completed   Hepatitis C Screening  Completed   Zoster (Shingles) Vaccine  Completed   HPV Vaccine  Aged Out    Advanced directives: (Declined) Advance directive discussed with you today. Even though you declined this today, please call our office should you change your mind, and we can give you the proper paperwork for you to fill out.  Next Medicare Annual Wellness Visit scheduled for next year: Yes  Managing Pain Without Opioids Opioids are strong medicines used to treat moderate to severe pain. For some people, especially those who have long-term (chronic)  pain, opioids may not be the best choice for pain management due to: Side effects like nausea, constipation, and sleepiness. The risk of addiction (opioid use disorder). The longer you take opioids, the greater your risk of addiction. Pain that lasts for more than 3 months is called chronic pain. Managing chronic pain usually requires more than one approach and is often provided by a team of health care providers working together (multidisciplinary approach). Pain management may be done at a pain management center or pain clinic. How to manage pain without the use of opioids Use non-opioid medicines Non-opioid medicines for pain may include: Over-the-counter or prescription non-steroidal anti-inflammatory drugs (NSAIDs). These may be the first medicines used for pain. They work well for muscle and bone pain, and they reduce swelling. Acetaminophen. This over-the-counter medicine may work well for milder pain but not swelling. Antidepressants. These may be used to treat chronic pain. A certain type of antidepressant (tricyclics) is often used. These medicines are given in lower doses for pain than when used for depression. Anticonvulsants. These are usually used to treat seizures but may also reduce nerve (neuropathic) pain. Muscle relaxants. These relieve pain caused by sudden muscle tightening (spasms). You may also use a pain medicine that is applied to the skin as a patch, cream, or gel (topical analgesic), such as a numbing medicine. These may cause fewer side effects than medicines taken by mouth. Do certain therapies as directed Some therapies can help with  pain management. They include: Physical therapy. You will do exercises to gain strength and flexibility. A physical therapist may teach you exercises to move and stretch parts of your body that are weak, stiff, or painful. You can learn these exercises at physical therapy visits and practice them at home. Physical therapy may also  involve: Massage. Heat wraps or applying heat or cold to affected areas. Electrical signals that interrupt pain signals (transcutaneous electrical nerve stimulation, TENS). Weak lasers that reduce pain and swelling (low-level laser therapy). Signals from your body that help you learn to regulate pain (biofeedback). Occupational therapy. This helps you to learn ways to function at home and work with less pain. Recreational therapy. This involves trying new activities or hobbies, such as a physical activity or drawing. Mental health therapy, including: Cognitive behavioral therapy (CBT). This helps you learn coping skills for dealing with pain. Acceptance and commitment therapy (ACT) to change the way you think and react to pain. Relaxation therapies, including muscle relaxation exercises and mindfulness-based stress reduction. Pain management counseling. This may be individual, family, or group counseling.  Receive medical treatments Medical treatments for pain management include: Nerve block injections. These may include a pain blocker and anti-inflammatory medicines. You may have injections: Near the spine to relieve chronic back or neck pain. Into joints to relieve back or joint pain. Into nerve areas that supply a painful area to relieve body pain. Into muscles (trigger point injections) to relieve some painful muscle conditions. A medical device placed near your spine to help block pain signals and relieve nerve pain or chronic back pain (spinal cord stimulation device). Acupuncture. Follow these instructions at home Medicines Take over-the-counter and prescription medicines only as told by your health care provider. If you are taking pain medicine, ask your health care providers about possible side effects to watch out for. Do not drive or use heavy machinery while taking prescription opioid pain medicine. Lifestyle  Do not use drugs or alcohol to reduce pain. If you drink alcohol,  limit how much you have to: 0-1 drink a day for women who are not pregnant. 0-2 drinks a day for men. Know how much alcohol is in a drink. In the [place] one drink equals one 12 oz bottle of beer (355 mL), one 5 oz glass of wine (148 mL), or one 1 oz glass of hard liquor (44 mL). Do not use any products that contain nicotine or tobacco. These products include cigarettes, chewing tobacco, and vaping devices, such as e-cigarettes. If you need help quitting, ask your health care provider. Eat a healthy diet and maintain a healthy weight. Poor diet and excess weight may make pain worse. Eat foods that are high in fiber. These include fresh fruits and vegetables, whole grains, and beans. Limit foods that are high in fat and processed sugars, such as fried and sweet foods. Exercise regularly. Exercise lowers stress and may help relieve pain. Ask your health care provider what activities and exercises are safe for you. If your health care provider approves, join an exercise class that combines movement and stress reduction. Examples include yoga and tai chi. Get enough sleep. Lack of sleep may make pain worse. Lower stress as much as possible. Practice stress reduction techniques as told by your therapist. General instructions Work with all your pain management providers to find the treatments that work best for you. You are an important member of your pain management team. There are many things you can do to reduce pain on  your own. Consider joining an online or in-person support group for people who have chronic pain. Keep all follow-up visits. This is important. Where to find more information You can find more information about managing pain without opioids from: American Academy of Pain Medicine: painmed.org Institute for Chronic Pain: instituteforchronicpain.org American Chronic Pain Association: theacpa.org Contact a health care provider if: You have side effects from pain medicine. Your pain  gets worse or does not get better with treatments or home therapy. You are struggling with anxiety or depression. Summary Many types of pain can be managed without opioids. Chronic pain may respond better to pain management without opioids. Pain is best managed when you and a team of health care providers work together. Pain management without opioids may include non-opioid medicines, medical treatments, physical therapy, mental health therapy, and lifestyle changes. Tell your health care providers if your pain gets worse or is not being managed well enough. This information is not intended to replace advice given to you by your health care provider. Make sure you discuss any questions you have with your health care provider. Document Revised: 07/14/2020 Document Reviewed: 07/14/2020 Elsevier Patient Education  2024 [employer].

## 2023-06-28 ENCOUNTER — Ambulatory Visit: Payer: Self-pay | Admitting: Internal Medicine

## 2023-06-28 NOTE — Telephone Encounter (Signed)
 Sorry, just sending a nurse Is not possible  Please make ROV, or to UC if persists or worsening today

## 2023-06-28 NOTE — Telephone Encounter (Signed)
 Copied from CRM [id number]. Topic: Clinical - Red Word Triage >> Jun 28, 2023  1:14 PM Pascal Lux wrote: Red Word that prompted transfer to Nurse Triage: Patient stated she stepped on a needle and is needing a nurse to come out and check her out. Currently experiencing pain and a little swelling. Mostly pain when walking on it.  Chief Complaint: left foot injury; with wound Symptoms: wound from stepping on "something" Frequency: constant Pertinent Negatives: Patient denies fever, cp, sob Disposition: [] ED /[] Urgent Care (no appt availability in office) / [] Appointment(In office/virtual)/ []  [facility]/ [] Home Care/ [x] Refused Recommended Disposition /[] [facility]/ []  Follow-up with PCP Additional Notes: patient stepped on something about a week ago.  She has a wound that she can not see. States she want's a tetanus shot and  home health nurse referral.  Instructed to schedule an appointment but patient declined.  Would like call back from the office.   Reason for Disposition  [1] No prior tetanus shots (or is not fully vaccinated) AND [2] any wound (e.g., cut, scrape)  Answer Assessment - Initial Assessment Questions 1. MECHANISM: "How did the injury happen?" (e.g., twisting injury, direct blow)      Stepped on something, has open wound 2. ONSET: "When did the injury happen?" (Minutes or hours ago)      About a week ago 3. LOCATION: "Where is the injury located?"      Left foot 4. APPEARANCE of INJURY: "What does the injury look like?"      Unable to see wound on foot 5. WEIGHT-BEARING: "Can you put weight on that foot?" "Can you walk (four steps or more)?"       Hurts when she tries to walk can't put shoe on 6. SIZE: For cuts, bruises, or swelling, ask: "How large is it?" (e.g., inches or centimeters;  entire joint)      unknown 7. PAIN: "Is there pain?" If Yes, ask: "How bad is the pain?"    (e.g., Scale 1-10; or mild, moderate, severe)   - NONE (0): no pain.   -  MILD (1-3): doesn't interfere with normal activities.    - MODERATE (4-7): interferes with normal activities (e.g., work or school) or awakens from sleep, limping.    - SEVERE (8-10): excruciating pain, unable to do any normal activities, unable to walk.      When walking 5/10 8. TETANUS: For any breaks in the skin, ask: "When was the last tetanus booster?"     no 9. OTHER SYMPTOMS: "Do you have any other symptoms?"      denies 10. PREGNANCY: "Is there any chance you are pregnant?" "When was your last menstrual period?"       na  Protocols used: Ankle and Foot Injury-A-AH

## 2023-06-29 NOTE — Telephone Encounter (Signed)
 Called and let Pt know

## 2023-07-01 ENCOUNTER — Other Ambulatory Visit: Payer: Self-pay | Admitting: Internal Medicine

## 2023-07-02 ENCOUNTER — Other Ambulatory Visit: Payer: Self-pay

## 2023-07-18 ENCOUNTER — Emergency Department

## 2023-07-18 ENCOUNTER — Other Ambulatory Visit: Payer: Self-pay

## 2023-07-18 ENCOUNTER — Inpatient Hospital Stay
Admission: EM | Admit: 2023-07-18 | Discharge: 2023-07-21 | DRG: 683 | Disposition: A | Discharge status: Home Health | Attending: Family Medicine | Admitting: Family Medicine

## 2023-07-18 ENCOUNTER — Observation Stay

## 2023-07-18 DIAGNOSIS — D696 Thrombocytopenia, unspecified: Secondary | ICD-10-CM | POA: Diagnosis not present

## 2023-07-18 DIAGNOSIS — L97829 Non-pressure chronic ulcer of other part of left lower leg with unspecified severity: Secondary | ICD-10-CM | POA: Diagnosis present

## 2023-07-18 DIAGNOSIS — I2510 Atherosclerotic heart disease of native coronary artery without angina pectoris: Secondary | ICD-10-CM | POA: Diagnosis present

## 2023-07-18 DIAGNOSIS — S0990XA Unspecified injury of head, initial encounter: Secondary | ICD-10-CM

## 2023-07-18 DIAGNOSIS — A084 Viral intestinal infection, unspecified: Secondary | ICD-10-CM | POA: Diagnosis present

## 2023-07-18 DIAGNOSIS — Z87891 Personal history of nicotine dependence: Secondary | ICD-10-CM | POA: Diagnosis not present

## 2023-07-18 DIAGNOSIS — D509 Iron deficiency anemia, unspecified: Secondary | ICD-10-CM | POA: Diagnosis present

## 2023-07-18 DIAGNOSIS — E875 Hyperkalemia: Secondary | ICD-10-CM | POA: Diagnosis present

## 2023-07-18 DIAGNOSIS — G8929 Other chronic pain: Secondary | ICD-10-CM | POA: Diagnosis present

## 2023-07-18 DIAGNOSIS — Z1152 Encounter for screening for COVID-19: Secondary | ICD-10-CM | POA: Diagnosis not present

## 2023-07-18 DIAGNOSIS — L97319 Non-pressure chronic ulcer of right ankle with unspecified severity: Secondary | ICD-10-CM | POA: Diagnosis not present

## 2023-07-18 DIAGNOSIS — E860 Dehydration: Secondary | ICD-10-CM | POA: Diagnosis present

## 2023-07-18 DIAGNOSIS — Z880 Allergy status to penicillin: Secondary | ICD-10-CM

## 2023-07-18 DIAGNOSIS — Z7951 Long term (current) use of inhaled steroids: Secondary | ICD-10-CM

## 2023-07-18 DIAGNOSIS — S0512XA Contusion of eyeball and orbital tissues, left eye, initial encounter: Secondary | ICD-10-CM | POA: Diagnosis not present

## 2023-07-18 DIAGNOSIS — Z7982 Long term (current) use of aspirin: Secondary | ICD-10-CM

## 2023-07-18 DIAGNOSIS — Z0189 Encounter for other specified special examinations: Secondary | ICD-10-CM | POA: Diagnosis not present

## 2023-07-18 DIAGNOSIS — L02416 Cutaneous abscess of left lower limb: Secondary | ICD-10-CM | POA: Diagnosis not present

## 2023-07-18 DIAGNOSIS — R296 Repeated falls: Secondary | ICD-10-CM | POA: Diagnosis present

## 2023-07-18 DIAGNOSIS — Z6839 Body mass index (BMI) 39.0-39.9, adult: Secondary | ICD-10-CM

## 2023-07-18 DIAGNOSIS — N179 Acute kidney failure, unspecified: Secondary | ICD-10-CM | POA: Diagnosis not present

## 2023-07-18 DIAGNOSIS — I6782 Cerebral ischemia: Secondary | ICD-10-CM | POA: Diagnosis not present

## 2023-07-18 DIAGNOSIS — Z8249 Family history of ischemic heart disease and other diseases of the circulatory system: Secondary | ICD-10-CM | POA: Diagnosis not present

## 2023-07-18 DIAGNOSIS — L304 Erythema intertrigo: Secondary | ICD-10-CM | POA: Diagnosis present

## 2023-07-18 DIAGNOSIS — J4489 Other specified chronic obstructive pulmonary disease: Secondary | ICD-10-CM | POA: Diagnosis present

## 2023-07-18 DIAGNOSIS — I10 Essential (primary) hypertension: Secondary | ICD-10-CM | POA: Diagnosis present

## 2023-07-18 DIAGNOSIS — Z833 Family history of diabetes mellitus: Secondary | ICD-10-CM | POA: Diagnosis not present

## 2023-07-18 DIAGNOSIS — M7989 Other specified soft tissue disorders: Secondary | ICD-10-CM | POA: Diagnosis not present

## 2023-07-18 DIAGNOSIS — Y92009 Unspecified place in unspecified non-institutional (private) residence as the place of occurrence of the external cause: Secondary | ICD-10-CM

## 2023-07-18 DIAGNOSIS — M1712 Unilateral primary osteoarthritis, left knee: Secondary | ICD-10-CM | POA: Diagnosis not present

## 2023-07-18 DIAGNOSIS — E871 Hypo-osmolality and hyponatremia: Secondary | ICD-10-CM | POA: Diagnosis not present

## 2023-07-18 DIAGNOSIS — W06XXXA Fall from bed, initial encounter: Secondary | ICD-10-CM | POA: Diagnosis present

## 2023-07-18 DIAGNOSIS — W19XXXA Unspecified fall, initial encounter: Principal | ICD-10-CM

## 2023-07-18 DIAGNOSIS — K219 Gastro-esophageal reflux disease without esophagitis: Secondary | ICD-10-CM | POA: Diagnosis present

## 2023-07-18 DIAGNOSIS — L97819 Non-pressure chronic ulcer of other part of right lower leg with unspecified severity: Secondary | ICD-10-CM | POA: Diagnosis present

## 2023-07-18 DIAGNOSIS — Z888 Allergy status to other drugs, medicaments and biological substances status: Secondary | ICD-10-CM

## 2023-07-18 DIAGNOSIS — R600 Localized edema: Secondary | ICD-10-CM | POA: Diagnosis not present

## 2023-07-18 DIAGNOSIS — E785 Hyperlipidemia, unspecified: Secondary | ICD-10-CM | POA: Diagnosis present

## 2023-07-18 DIAGNOSIS — R000 Tachycardia, unspecified: Secondary | ICD-10-CM | POA: Diagnosis present

## 2023-07-18 DIAGNOSIS — R220 Localized swelling, mass and lump, head: Secondary | ICD-10-CM | POA: Diagnosis not present

## 2023-07-18 DIAGNOSIS — L03116 Cellulitis of left lower limb: Secondary | ICD-10-CM | POA: Diagnosis not present

## 2023-07-18 DIAGNOSIS — M79604 Pain in right leg: Secondary | ICD-10-CM | POA: Diagnosis not present

## 2023-07-18 DIAGNOSIS — Z79899 Other long term (current) drug therapy: Secondary | ICD-10-CM

## 2023-07-18 DIAGNOSIS — J439 Emphysema, unspecified: Secondary | ICD-10-CM | POA: Diagnosis not present

## 2023-07-18 DIAGNOSIS — S0083XA Contusion of other part of head, initial encounter: Secondary | ICD-10-CM | POA: Diagnosis present

## 2023-07-18 DIAGNOSIS — I959 Hypotension, unspecified: Secondary | ICD-10-CM | POA: Diagnosis not present

## 2023-07-18 DIAGNOSIS — Z841 Family history of disorders of kidney and ureter: Secondary | ICD-10-CM

## 2023-07-18 DIAGNOSIS — M79605 Pain in left leg: Secondary | ICD-10-CM | POA: Diagnosis not present

## 2023-07-18 DIAGNOSIS — M7731 Calcaneal spur, right foot: Secondary | ICD-10-CM | POA: Diagnosis not present

## 2023-07-18 DIAGNOSIS — I890 Lymphedema, not elsewhere classified: Secondary | ICD-10-CM | POA: Diagnosis present

## 2023-07-18 DIAGNOSIS — I493 Ventricular premature depolarization: Secondary | ICD-10-CM | POA: Diagnosis present

## 2023-07-18 LAB — CBC WITH DIFFERENTIAL/PLATELET
Abs Immature Granulocytes: 0.05 10*3/uL (ref 0.00–0.07)
Basophils Absolute: 0 10*3/uL (ref 0.0–0.1)
Basophils Relative: 0 %
Eosinophils Absolute: 0.1 10*3/uL (ref 0.0–0.5)
Eosinophils Relative: 1 %
HCT: 34.9 % — ABNORMAL LOW (ref 36.0–46.0)
Hemoglobin: 11.3 g/dL — ABNORMAL LOW (ref 12.0–15.0)
Immature Granulocytes: 1 %
Lymphocytes Relative: 6 %
Lymphs Abs: 0.6 10*3/uL — ABNORMAL LOW (ref 0.7–4.0)
MCH: 25.6 pg — ABNORMAL LOW (ref 26.0–34.0)
MCHC: 32.4 g/dL (ref 30.0–36.0)
MCV: 79.1 fL — ABNORMAL LOW (ref 80.0–100.0)
Monocytes Absolute: 0.7 10*3/uL (ref 0.1–1.0)
Monocytes Relative: 8 %
Neutro Abs: 7.8 10*3/uL — ABNORMAL HIGH (ref 1.7–7.7)
Neutrophils Relative %: 84 %
Platelets: 167 10*3/uL (ref 150–400)
RBC: 4.41 MIL/uL (ref 3.87–5.11)
RDW: 16.5 % — ABNORMAL HIGH (ref 11.5–15.5)
WBC: 9.2 10*3/uL (ref 4.0–10.5)
nRBC: 0 % (ref 0.0–0.2)

## 2023-07-18 LAB — BASIC METABOLIC PANEL WITH GFR
Anion gap: 12 (ref 5–15)
BUN: 104 mg/dL — ABNORMAL HIGH (ref 8–23)
CO2: 16 mmol/L — ABNORMAL LOW (ref 22–32)
Calcium: 9.1 mg/dL (ref 8.9–10.3)
Chloride: 103 mmol/L (ref 98–111)
Creatinine, Ser: 3.82 mg/dL — ABNORMAL HIGH (ref 0.44–1.00)
GFR, Estimated: 12 mL/min — ABNORMAL LOW (ref >60)
Glucose, Bld: 96 mg/dL (ref 70–99)
Potassium: 5.4 mmol/L — ABNORMAL HIGH (ref 3.5–5.1)
Sodium: 131 mmol/L — ABNORMAL LOW (ref 135–145)

## 2023-07-18 LAB — URINALYSIS, ROUTINE W REFLEX MICROSCOPIC
Bilirubin Urine: NEGATIVE
Glucose, UA: NEGATIVE mg/dL
Hgb urine dipstick: NEGATIVE
Ketones, ur: NEGATIVE mg/dL
Leukocytes,Ua: NEGATIVE
Nitrite: NEGATIVE
Protein, ur: 30 mg/dL — AB
Specific Gravity, Urine: 1.016 (ref 1.005–1.030)
pH: 5 (ref 5.0–8.0)

## 2023-07-18 LAB — URIC ACID: Uric Acid, Serum: 11.6 mg/dL — ABNORMAL HIGH (ref 2.5–7.1)

## 2023-07-18 LAB — LACTIC ACID, PLASMA
Lactic Acid, Venous: 1.8 mmol/L (ref 0.5–1.9)
Lactic Acid, Venous: 1.6 mmol/L (ref 0.5–1.9)

## 2023-07-18 LAB — PROCALCITONIN: Procalcitonin: 0.47 ng/mL

## 2023-07-18 LAB — CK: Total CK: 605 U/L — ABNORMAL HIGH (ref 38–234)

## 2023-07-18 MED ORDER — SODIUM CHLORIDE 0.45 % IV SOLN: INTRAVENOUS | Status: DC

## 2023-07-18 MED ORDER — FLUTICASONE PROPIONATE 50 MCG/ACT NA SUSP: 2.0000 | Freq: Every day | NASAL | Status: DC | PRN

## 2023-07-18 MED ORDER — TRIAMCINOLONE ACETONIDE 55 MCG/ACT NA AERO: 1.0000 | INHALATION_SPRAY | Freq: Two times a day (BID) | NASAL | Status: DC

## 2023-07-18 MED ORDER — LACTATED RINGERS IV BOLUS
1000.0000 mL | Freq: Once | INTRAVENOUS | Status: AC
  Administered 2023-07-18: 1000 mL via INTRAVENOUS

## 2023-07-18 MED ORDER — FLUTICASONE PROPIONATE HFA 110 MCG/ACT IN AERO: 2.0000 | INHALATION_SPRAY | Freq: Two times a day (BID) | RESPIRATORY_TRACT | Status: DC

## 2023-07-18 MED ORDER — OXYCODONE HCL 5 MG PO TABS
5.0000 mg | ORAL_TABLET | Freq: Four times a day (QID) | ORAL | Status: DC | PRN
  Administered 2023-07-18 – 2023-07-20 (×4): 5 mg via ORAL
  Filled 2023-07-18 (×4): qty 1

## 2023-07-18 MED ORDER — ONDANSETRON HCL 4 MG PO TABS: 4.0000 mg | ORAL_TABLET | Freq: Four times a day (QID) | ORAL | Status: DC | PRN

## 2023-07-18 MED ORDER — SODIUM CHLORIDE 0.9 % IV BOLUS
1000.0000 mL | Freq: Once | INTRAVENOUS | Status: AC
  Administered 2023-07-18: 1000 mL via INTRAVENOUS

## 2023-07-18 MED ORDER — HEPARIN SODIUM (PORCINE) 5000 UNIT/ML IJ SOLN
5000.0000 [IU] | Freq: Two times a day (BID) | INTRAMUSCULAR | Status: DC
  Administered 2023-07-18 – 2023-07-21 (×6): 5000 [IU] via SUBCUTANEOUS
  Filled 2023-07-18 (×6): qty 1

## 2023-07-18 MED ORDER — MEDIHONEY WOUND/BURN DRESSING EX PSTE
1.0000 | PASTE | Freq: Every day | CUTANEOUS | Status: DC
  Administered 2023-07-18 – 2023-07-21 (×4): 1 via TOPICAL
  Filled 2023-07-18 (×3): qty 44

## 2023-07-18 MED ORDER — SODIUM CHLORIDE 0.9 % IV SOLN
2.0000 g | Freq: Once | INTRAVENOUS | Status: AC
  Administered 2023-07-18: 2 g via INTRAVENOUS
  Filled 2023-07-18: qty 20

## 2023-07-18 MED ORDER — VANCOMYCIN HCL 2000 MG/400ML IV SOLN
2000.0000 mg | Freq: Once | INTRAVENOUS | Status: AC
  Administered 2023-07-18: 2000 mg via INTRAVENOUS
  Filled 2023-07-18: qty 400

## 2023-07-18 MED ORDER — HYDRALAZINE HCL 20 MG/ML IJ SOLN: 5.0000 mg | Freq: Four times a day (QID) | INTRAMUSCULAR | Status: DC | PRN

## 2023-07-18 MED ORDER — ATORVASTATIN CALCIUM 20 MG PO TABS
20.0000 mg | ORAL_TABLET | Freq: Every day | ORAL | Status: DC
  Administered 2023-07-19 – 2023-07-21 (×3): 20 mg via ORAL
  Filled 2023-07-18 (×3): qty 1

## 2023-07-18 MED ORDER — DICLOFENAC SODIUM 1 % TD GEL: 2.0000 g | Freq: Four times a day (QID) | TRANSDERMAL | Status: DC | PRN

## 2023-07-18 MED ORDER — ONDANSETRON HCL 4 MG/2ML IJ SOLN: 4.0000 mg | Freq: Four times a day (QID) | INTRAMUSCULAR | Status: DC | PRN

## 2023-07-18 MED ORDER — BUDESONIDE 0.25 MG/2ML IN SUSP
0.2500 mg | Freq: Two times a day (BID) | RESPIRATORY_TRACT | Status: DC
  Administered 2023-07-19 – 2023-07-21 (×5): 0.25 mg via RESPIRATORY_TRACT
  Filled 2023-07-18 (×5): qty 2

## 2023-07-18 MED ORDER — ALBUTEROL SULFATE (2.5 MG/3ML) 0.083% IN NEBU: 2.5000 mg | INHALATION_SOLUTION | Freq: Four times a day (QID) | RESPIRATORY_TRACT | Status: DC | PRN

## 2023-07-18 MED ORDER — HYDROMORPHONE HCL 1 MG/ML IJ SOLN
0.5000 mg | Freq: Once | INTRAMUSCULAR | Status: AC
  Administered 2023-07-18: 0.5 mg via INTRAVENOUS
  Filled 2023-07-18: qty 0.5

## 2023-07-18 MED ORDER — ASPIRIN 81 MG PO TBEC
81.0000 mg | DELAYED_RELEASE_TABLET | Freq: Every day | ORAL | Status: DC
  Administered 2023-07-19 – 2023-07-21 (×3): 81 mg via ORAL
  Filled 2023-07-18 (×3): qty 1

## 2023-07-18 MED ORDER — ACETAMINOPHEN 325 MG PO TABS: 650.0000 mg | ORAL_TABLET | Freq: Four times a day (QID) | ORAL | Status: DC | PRN

## 2023-07-18 MED ORDER — LACTATED RINGERS IV SOLN: INTRAVENOUS | Status: AC

## 2023-07-18 MED ORDER — TRIAMCINOLONE ACETONIDE 0.1 % EX CREA
TOPICAL_CREAM | Freq: Three times a day (TID) | CUTANEOUS | Status: DC
  Filled 2023-07-18: qty 15

## 2023-07-18 MED ORDER — LORATADINE 10 MG PO TABS
10.0000 mg | ORAL_TABLET | Freq: Every day | ORAL | Status: DC
  Administered 2023-07-19 – 2023-07-21 (×3): 10 mg via ORAL
  Filled 2023-07-18 (×3): qty 1

## 2023-07-18 NOTE — ED Notes (Signed)
 Patient repositioned in bed.

## 2023-07-18 NOTE — ED Notes (Signed)
 Did EKG--pt is sinus tach.

## 2023-07-18 NOTE — ED Notes (Signed)
 Pt here [facility] from home for fall out of bed/slid out of bed, no LOC. No blood thinner use. Hematoma above L eye due to hitting night stand.   126/57 98%  HR: 130

## 2023-07-18 NOTE — H&P (Addendum)
 History and Physical    Bethany Lopez WUJ:[id number] DOB: [birth year] DOA: 07/18/2023  PCP: Corwin Levins, MD (Confirm with patient/family/NH records and if not entered, this has to be entered at [facility] point of entry) Patient coming from: Home  I have personally briefly reviewed patient's old medical records in [facility]  Chief Complaint: Feeling weak and fell at home  HPI: Bethany Lopez is a 73 y.o. female with medical history significant of HTN,  HLD, morbid obesity, chronic lymphedema, asthma/COPD, chronic iron deficiency anemia, chronic thrombocytopenia, presented with worsening of generalized weakness, poor appetite, and fluid intake, dark-colored urine, frequent falls at home.  Symptoms started 2 days ago, patient started to have poor appetite and oral intake, decreased fluid intake, she has limited she drink 2 bottles of water a day for the last 2 days and she found her urine color has been darker and significant decrease of urine output.  Denied any dysuria no back pain no fever or chills.  Intermittently she also had diarrhea for the last 2 days moderate amount once a day denies any blood or pain no tenesmus.  Denies any nausea.  Vomiting.  She has been feeling weak and lightheadedness fell this morning and hit her forehead but denied any LOC, no seizure like symptoms.  ED Course: Sinus tachycardia, afebrile, blood pressure 108/63, oxygenation 100% room air.  Trauma scan CT head and neck negative for fracture or dislocation or subluxation.  Blood work showed creatinine 3.8 compared to baseline less than 1.0, bicarb 16, lactic acid 1.8, K5.4, WBC 9.2, hemoglobin 11.3.  Patient was given IV fluids bolus x 1 and vancomycin and ceftriaxone to treat the putative left shin cellulitis.  Review of Systems: As per HPI otherwise 14 point review of systems negative.    Past Medical History:  Diagnosis Date   Allergic rhinitis, cause unspecified 05/01/2012   Asthma     Carpal tunnel syndrome 06/17/2014   GERD (gastroesophageal reflux disease) 02/11/2015   Hyperlipidemia 05/30/2011   NEC/NOS     Hypertension    Obesity    Thrombocytopenia (HCC) 08/12/2015   Tobacco abuse     Past Surgical History:  Procedure Laterality Date   arthroscopic right knee     5-6 yrs ago   TUBAL LIGATION       reports that she has been smoking cigarettes. She has never used smokeless tobacco. She reports current alcohol use. She reports that she does not use drugs.  Allergies  Allergen Reactions   Lodine [Etodolac] Other (See Comments)    GI upset   Penicillins     Family History  Problem Relation Age of Onset   Diabetes Mother    Kidney disease Mother        renal failure   Heart disease Father        congestive heart failure   Diabetes Sister    Diabetes Brother    Breast cancer Neg Hx      Prior to Admission medications   Medication Sig Start Date End Date Taking? Authorizing Provider  acetaminophen (TYLENOL) 500 MG tablet Take by mouth.    [provider]  albuterol (PROVENTIL HFA;VENTOLIN HFA) 108 (90 Base) MCG/ACT inhaler Inhale 2 puffs into the lungs every 6 (six) hours as needed for wheezing or shortness of breath. 08/10/16   Corwin Levins, MD  amLODipine (NORVASC) 10 MG tablet Take 1 tablet (10 mg total) by mouth daily. 06/13/18   Corwin Levins, MD  aspirin 81 MG EC tablet Take by mouth.    [provider]  atorvastatin (LIPITOR) 20 MG tablet Take 1 tablet by mouth once daily 07/02/23   Corwin Levins, MD  B Complex-C (SUPER B COMPLEX PO) Take by mouth.    [provider]  cetirizine (ZYRTEC) 10 MG tablet Take 1 tablet (10 mg total) by mouth daily. 08/10/16 08/10/17  Corwin Levins, MD  diclofenac sodium (VOLTAREN) 1 % GEL Apply 2 g topically 4 (four) times daily as needed. 12/12/18   Corwin Levins, MD  Ferrous Sulfate (IRON PO) Take by mouth.    [provider]  fexofenadine (ALLEGRA) 180 MG tablet Take by mouth. 12/01/19    [provider]  FLOVENT HFA 110 MCG/ACT inhaler Inhale 2 puffs into the lungs 2 (two) times daily. 04/20/23   Corwin Levins, MD  fluticasone (FLONASE) 50 MCG/ACT nasal spray Place 2 sprays into both nostrils daily. 10/20/21   Corwin Levins, MD  ibuprofen (ADVIL,MOTRIN) 800 MG tablet Take 1 tablet (800 mg total) by mouth every 8 (eight) hours as needed. 06/13/18   Corwin Levins, MD  iron polysaccharides (NU-IRON) 150 MG capsule Take 1 capsule (150 mg total) by mouth daily. 05/08/21   Corwin Levins, MD  losartan (COZAAR) 50 MG tablet Take 2 tablets by mouth once daily 07/02/23   Corwin Levins, MD  meloxicam (MOBIC) 15 MG tablet TAKE 1 TABLET BY MOUTH ONCE DAILY AS NEEDED FOR PAIN 03/26/23   Corwin Levins, MD  Multiple Vitamin (MULTIVITAMIN) tablet Take 1 tablet by mouth daily.    [provider]  NIFEdipine (ADALAT CC) 30 MG 24 hr tablet nifedipine ER 30 mg tablet,extended release 01/08/20   [provider]  Omega-3 Fatty Acids (FISH OIL) 1000 MG CAPS Take 1 capsule by mouth daily.    [provider]  triamcinolone (NASACORT) 55 MCG/ACT AERO nasal inhaler USE 2 SPRAY(S) INTRANASALLY ONCE DAILY 05/29/19   Corwin Levins, MD  triamcinolone cream (KENALOG) 0.1 % APPLY  CREAM EXTERNALLY TO AFFECTED AREA TWICE DAILY AS NEEDED 12/01/19   Corwin Levins, MD  triamcinolone cream (KENALOG) 0.1 % Apply topically. 12/01/19   [provider]  VENTOLIN HFA 108 (90 Base) MCG/ACT inhaler INHALE 2 PUFFS BY MOUTH EVERY 6 HOURS AS NEEDED FOR WHEEZING FOR SHORTNESS OF BREATH 05/30/19   Corwin Levins, MD    Physical Exam: Vitals:   07/18/23 0825 07/18/23 0826 07/18/23 1112  BP: 114/63  (!) 108/36  Pulse: (!) 123  (!) 115  Resp: 20  18  Temp: 97.9 F (36.6 C)  97.8 F (36.6 C)  TempSrc: Oral    SpO2: 98%  100%  Weight:  111.1 kg   Height:  5\' 6"  (1.676 m)     Constitutional: NAD, calm, comfortable Vitals:   07/18/23 0825 07/18/23 0826 07/18/23 1112  BP: 114/63  (!) 108/36   Pulse: (!) 123  (!) 115  Resp: 20  18  Temp: 97.9 F (36.6 C)  97.8 F (36.6 C)  TempSrc: Oral    SpO2: 98%  100%  Weight:  111.1 kg   Height:  5\' 6"  (1.676 m)    Eyes: PERRL, lids and conjunctivae normal ENMT: Mucous membranes are dry. Posterior pharynx clear of any exudate or lesions.Normal dentition.  Neck: normal, supple, no masses, no thyromegaly Respiratory: clear to auscultation bilaterally, no wheezing, no crackles. Normal respiratory effort. No accessory muscle use.  Cardiovascular: Regular rate  and rhythm, no murmurs / rubs / gallops. No extremity edema. 2+ pedal pulses. No carotid bruits.  Abdomen: no tenderness, no masses palpated. No hepatosplenomegaly. Bowel sounds positive.  Musculoskeletal: no clubbing / cyanosis. No joint deformity upper and lower extremities. Good ROM, no contractures. Normal muscle tone.  Skin: Chronic lymphedema like changes with small skin openings on bilateral shin areas more on the left with clear drainage Neurologic: CN 2-12 grossly intact. Sensation intact, DTR normal. Strength 5/5 in all 4.  Psychiatric: Normal judgment and insight. Alert and oriented x 3. Normal mood.     Labs on Admission: I have personally reviewed following labs and imaging studies  CBC: Recent Labs  Lab 07/18/23 0955  WBC 9.2  NEUTROABS 7.8*  HGB 11.3*  HCT 34.9*  MCV 79.1*  PLT 167   Basic Metabolic Panel: Recent Labs  Lab 07/18/23 0955  NA 131*  K 5.4*  CL 103  CO2 16*  GLUCOSE 96  BUN 104*  CREATININE 3.82*  CALCIUM 9.1   GFR: Estimated Creatinine Clearance: 16.3 mL/min (A) (by C-G formula based on SCr of 3.82 mg/dL (H)). Liver Function Tests: No results for input(s): "AST", "ALT", "ALKPHOS", "BILITOT", "PROT", "ALBUMIN" in the last 168 hours. No results for input(s): "LIPASE", "AMYLASE" in the last 168 hours. No results for input(s): "AMMONIA" in the last 168 hours. Coagulation Profile: No results for input(s): "INR", "PROTIME" in the last  168 hours. Cardiac Enzymes: Recent Labs  Lab 07/18/23 0955  CKTOTAL 605*   BNP (last 3 results) No results for input(s): "PROBNP" in the last 8760 hours. HbA1C: No results for input(s): "HGBA1C" in the last 72 hours. CBG: No results for input(s): "GLUCAP" in the last 168 hours. Lipid Profile: No results for input(s): "CHOL", "HDL", "LDLCALC", "TRIG", "CHOLHDL", "LDLDIRECT" in the last 72 hours. Thyroid Function Tests: No results for input(s): "TSH", "T4TOTAL", "FREET4", "T3FREE", "THYROIDAB" in the last 72 hours. Anemia Panel: No results for input(s): "VITAMINB12", "FOLATE", "FERRITIN", "TIBC", "IRON", "RETICCTPCT" in the last 72 hours. Urine analysis:    Component Value Date/Time   COLORURINE YELLOW (A) 07/18/2023 0838   APPEARANCEUR CLOUDY (A) 07/18/2023 0838   LABSPEC 1.016 07/18/2023 0838   PHURINE 5.0 07/18/2023 0838   GLUCOSEU NEGATIVE 07/18/2023 0838   GLUCOSEU NEGATIVE 04/24/2023 1134   HGBUR NEGATIVE 07/18/2023 0838   BILIRUBINUR NEGATIVE 07/18/2023 0838   BILIRUBINUR neg 08/02/2011 1631   KETONESUR NEGATIVE 07/18/2023 0838   PROTEINUR 30 (A) 07/18/2023 0838   UROBILINOGEN 0.2 04/24/2023 1134   NITRITE NEGATIVE 07/18/2023 0838   LEUKOCYTESUR NEGATIVE 07/18/2023 0838    Radiological Exams on Admission: CT HEAD WO CONTRAST ( ) Result Date: 07/18/2023 CLINICAL DATA:  Trauma, fall at of bed, hematoma above left eye. EXAM: CT HEAD WITHOUT CONTRAST CT CERVICAL SPINE WITHOUT CONTRAST TECHNIQUE: Multidetector CT imaging of the head and cervical spine was performed following the standard protocol without intravenous contrast. Multiplanar CT image reconstructions of the cervical spine were also generated. RADIATION DOSE REDUCTION: This exam was performed according to the departmental dose-optimization program which includes automated exposure control, adjustment of the mA and/or kV according to patient size and/or use of iterative reconstruction technique. COMPARISON:  None  Available. FINDINGS: CT HEAD FINDINGS Brain: No acute intracranial hemorrhage. No CT evidence of acute infarct. Nonspecific hypoattenuation in the periventricular and subcortical white matter favored to reflect chronic microvascular ischemic changes. 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 No edema, mass effect, or midline shift. The basilar  cisterns are patent. Ventricles: Prominence of the ventricles suggesting underlying parenchymal volume loss. Vascular: Atherosclerotic calcifications of the carotid siphons. No hyperdense vessel. Skull: No acute or aggressive finding. Orbits: Visualized orbits are symmetric. Sinuses: The visualized paranasal sinuses are clear. Other: Left frontal scalp hematoma with soft tissue swelling extending inferiorly over the supraorbital ridge and within the preseptal soft tissues along the superolateral aspect of the left orbit. Mastoid air cells are clear. CT CERVICAL SPINE FINDINGS Alignment: Reversal of the normal cervical lordosis. Trace anterolisthesis of C3 on C4. No facet subluxation or dislocation. Skull base and vertebrae: No compression fracture or displaced fracture in the cervical spine. No suspicious osseous lesion. Soft tissues and spinal canal: No prevertebral fluid or swelling. No visible canal hematoma. Disc levels: Severe disc space narrowing at C3-4, C4-5, and C5-6. Disc osteophyte complexes at multiple levels. No high-grade osseous spinal canal stenosis. Facet arthrosis throughout the cervical spine. Foraminal narrowing is most pronounced at C4-5. Upper chest: Emphysema in the lung apices. Other: None. IMPRESSION: No CT evidence of acute intracranial abnormality. Left frontal scalp hematoma. Partial extension of soft tissue swelling into the preseptal soft tissues of the left orbit. No acute fracture or traumatic malalignment of the cervical spine. Degenerative changes as above. Emphysema in the lung apices.  Electronically Signed   By: Emily Filbert M.D.   On: 07/18/2023 10:31   CT Cervical Spine Wo Contrast Result Date: 07/18/2023 CLINICAL DATA:  Trauma, fall at of bed, hematoma above left eye. EXAM: CT HEAD WITHOUT CONTRAST CT CERVICAL SPINE WITHOUT CONTRAST TECHNIQUE: Multidetector CT imaging of the head and cervical spine was performed following the standard protocol without intravenous contrast. Multiplanar CT image reconstructions of the cervical spine were also generated. RADIATION DOSE REDUCTION: This exam was performed according to the departmental dose-optimization program which includes automated exposure control, adjustment of the mA and/or kV according to patient size and/or use of iterative reconstruction technique. COMPARISON:  None Available. FINDINGS: CT HEAD FINDINGS Brain: No acute intracranial hemorrhage. No CT evidence of acute infarct. Nonspecific hypoattenuation in the periventricular and subcortical white matter favored to reflect chronic microvascular ischemic changes. 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 No edema, mass effect, or midline shift. The basilar cisterns are patent. Ventricles: Prominence of the ventricles suggesting underlying parenchymal volume loss. Vascular: Atherosclerotic calcifications of the carotid siphons. No hyperdense vessel. Skull: No acute or aggressive finding. Orbits: Visualized orbits are symmetric. Sinuses: The visualized paranasal sinuses are clear. Other: Left frontal scalp hematoma with soft tissue swelling extending inferiorly over the supraorbital ridge and within the preseptal soft tissues along the superolateral aspect of the left orbit. Mastoid air cells are clear. CT CERVICAL SPINE FINDINGS Alignment: Reversal of the normal cervical lordosis. Trace anterolisthesis of C3 on C4. No facet subluxation or dislocation. Skull base and vertebrae: No compression fracture or displaced fracture in the  cervical spine. No suspicious osseous lesion. Soft tissues and spinal canal: No prevertebral fluid or swelling. No visible canal hematoma. Disc levels: Severe disc space narrowing at C3-4, C4-5, and C5-6. Disc osteophyte complexes at multiple levels. No high-grade osseous spinal canal stenosis. Facet arthrosis throughout the cervical spine. Foraminal narrowing is most pronounced at C4-5. Upper chest: Emphysema in the lung apices. Other: None. IMPRESSION: No CT evidence of acute intracranial abnormality. Left frontal scalp hematoma. Partial extension of soft tissue swelling into the preseptal soft tissues of the left orbit. No acute fracture or traumatic malalignment of the cervical spine.  Degenerative changes as above. Emphysema in the lung apices. Electronically Signed   By: Emily Filbert M.D.   On: 07/18/2023 10:31   DG Tibia/Fibula Left Result Date: 07/18/2023 CLINICAL DATA:  Anterior purulent lesion over the shin. EXAM: LEFT TIBIA AND FIBULA - 2 VIEW COMPARISON:  None Available. FINDINGS: Moderate degenerative changes are present in the medial compartment of the knee. Small joint effusion may be present at the knee. No acute fractures or focal osseous lesions are present. Edematous changes and possible fluid collection are noted just distal to the lateral malleolus. Underlying infection is not excluded. IMPRESSION: 1. Edematous changes and possible fluid collection just distal to the lateral malleolus. Underlying infection is not excluded. 2. Moderate degenerative changes in the medial compartment of the knee. Electronically Signed   By: Marin Roberts M.D.   On: 07/18/2023 09:52   DG Foot Complete Right Result Date: 07/18/2023 CLINICAL DATA:  Slid out of bed. Serial and lesion lateral to the lateral malleolus. EXAM: RIGHT FOOT COMPLETE - 3 VIEW COMPARISON:  None Available. FINDINGS: Focal soft tissue swelling and ulceration is evident lateral to the right ankle. No acute or healing bone lesion is  present. A plantar calcaneal spur is present. No radiopaque foreign body is present. The foot is otherwise unremarkable. IMPRESSION: 1. Focal soft tissue swelling and ulceration lateral to the right ankle. 2. No acute or healing bone lesion. 3. Plantar calcaneal spur. Electronically Signed   By: Marin Roberts M.D.   On: 07/18/2023 09:50   DG Chest Portable 1 View Result Date: 07/18/2023 CLINICAL DATA:  Slid out of bed. EXAM: PORTABLE CHEST 1 VIEW COMPARISON:  None Available. FINDINGS: The heart size and mediastinal contours are within normal limits. Both lungs are clear. The visualized skeletal structures are unremarkable. IMPRESSION: No active disease. Electronically Signed   By: Marin Roberts M.D.   On: 07/18/2023 09:47    EKG: Independently reviewed.  Sinus tachycardia, no acute ST changes.  Assessment/Plan Principal Problem:   AKI (acute kidney injury) (HCC)  (please populate well all problems here in Problem List. (For example, if patient is on BP meds at home and you resume or decide to hold them, it is a problem that needs to be her. Same for CAD, COPD, HLD and so on)  AKI -Probably prerenal, as patient appeared to be volume depleted, likely secondary to poor oral intake, hopefully not ATN -Continue LR at 125 mL/h for 1 day -Check renal ultrasound -Check CK and uric acid level  Chronic lymphedema ulcer on bilateral shin area -Left more than right, no signs of active infection, consult wound care team.  History of thrombocytopenia -Platelet level stable, low suspicion for HUS  HTN -Blood pressure borderline low, likely secondary to dehydration and volume contraction -Hold home BP medication including losartan, nifedipine -Start as needed hydralazine  Hyperlipidemia -Continue statin  Asthma/COPD -No acute concern -Continue as needed inhalers  Morbid obesity -BMI more than 39 -Calorie control recommended  DVT prophylaxis: Heparin subcu Code Status: Full  code Family Communication: None at bedside Disposition Plan: Expect less than 2 midnight hospital stay Consults called: None Admission status: Telemetry observation   Emeline General MD [facility] Pager [phone]  07/18/2023, 12:36 PM

## 2023-07-18 NOTE — ED Provider Notes (Signed)
 [facility] Provider Note    Event Date/Time   First MD Initiated Contact with Patient 07/18/23 [phone]     (approximate)   History   Fall   HPI  Bethany Lopez is a 73 y.o. female who presents to the ED for evaluation of Fall   Reviewed PCP visit from January.  Obese patient with history of HTN, HLD, smoking, IDA, asthma  Patient presents from home after a fall where she slipped out of bed and struck her left forehead on the nightstand.  No syncope.  She reports dyspnea on exertion preceding this and multiple wounds on her legs.  Generalized weakness.  No known fevers, emesis, abdominal or chest pain.   Physical Exam   Triage Vital Signs: ED Triage Vitals  Encounter Vitals Group     BP 07/18/23 0825 114/63     Systolic BP Percentile --      Diastolic BP Percentile --      Pulse Rate 07/18/23 0825 (!) 123     Resp 07/18/23 0825 20     Temp 07/18/23 0825 97.9 F (36.6 C)     Temp Source 07/18/23 0825 Oral     SpO2 07/18/23 0825 98 %     Weight 07/18/23 0826 245 lb (111.1 kg)     Height 07/18/23 0826 5\' 6"  (1.676 m)     Head Circumference --      Peak Flow --      Pain Score 07/18/23 0824 3     Pain Loc --      Pain Education --      Exclude from Growth Chart --     Most recent vital signs: Vitals:   07/18/23 0825 07/18/23 1112  BP: 114/63 (!) 108/36  Pulse: (!) 123 (!) 115  Resp: 20 18  Temp: 97.9 F (36.6 C) 97.8 F (36.6 C)  SpO2: 98% 100%    General: Awake, no distress.  Looks dry CV:  Good peripheral perfusion.  Tachycardic and regular Resp:  Normal effort.  Minimal expiratory wheezing Abd:  No distention.  Soft and benign MSK:  No deformity noted.  Multiple lesions to the bilateral lower extremities.  A purulent wound to the left anterior shin, and open wound to the lateral right foot just inferior to the lateral malleolus without purulence.  Scabbing and scaling lesion to the right shin that is nonpurulent. Neuro:  No  focal deficits appreciated. Other:  Closed hematoma to the left forehead without signs of EOM entrapment or bony step-offs     ED Results / Procedures / Treatments   Labs (all labs ordered are listed, but only abnormal results are displayed) Labs Reviewed  CBC WITH DIFFERENTIAL/PLATELET - Abnormal; Notable for the following components:      Result Value   Hemoglobin 11.3 (*)    HCT 34.9 (*)    MCV 79.1 (*)    MCH 25.6 (*)    RDW 16.5 (*)    Neutro Abs 7.8 (*)    Lymphs Abs 0.6 (*)    All other components within normal limits  BASIC METABOLIC PANEL WITH GFR - Abnormal; Notable for the following components:   Sodium 131 (*)    Potassium 5.4 (*)    CO2 16 (*)    BUN 104 (*)    Creatinine, Ser 3.82 (*)    GFR, Estimated 12 (*)    All other components within normal limits  CULTURE, BLOOD (ROUTINE X 2)  CULTURE, BLOOD (  ROUTINE X 2)  LACTIC ACID, PLASMA  URINALYSIS, ROUTINE W REFLEX MICROSCOPIC  LACTIC ACID, PLASMA  PROCALCITONIN    EKG Sinus tachycardia with a rate of 133 bpm.  Normal axis and intervals.  No clear signs of acute ischemia.  RADIOLOGY CT head interpreted by me without evidence of acute intracranial pathology CT cervical spine interpreted by me without evidence of fracture or dislocation CXR interpreted by me without evidence of acute cardiopulmonary pathology. Plain film of the left tib-fib and right ankle interpreted by me without signs of acute bony pathology  Official radiology report(s): CT HEAD WO CONTRAST ( ) Result Date: 07/18/2023 CLINICAL DATA:  Trauma, fall at of bed, hematoma above left eye. EXAM: CT HEAD WITHOUT CONTRAST CT CERVICAL SPINE WITHOUT CONTRAST TECHNIQUE: Multidetector CT imaging of the head and cervical spine was performed following the standard protocol without intravenous contrast. Multiplanar CT image reconstructions of the cervical spine were also generated. RADIATION DOSE REDUCTION: This exam was performed according to the  departmental dose-optimization program which includes automated exposure control, adjustment of the mA and/or kV according to patient size and/or use of iterative reconstruction technique. COMPARISON:  None Available. FINDINGS: CT HEAD FINDINGS Brain: No acute intracranial hemorrhage. No CT evidence of acute infarct. Nonspecific hypoattenuation in the periventricular and subcortical white matter favored to reflect chronic microvascular ischemic changes. 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 No edema, mass effect, or midline shift. The basilar cisterns are patent. Ventricles: Prominence of the ventricles suggesting underlying parenchymal volume loss. Vascular: Atherosclerotic calcifications of the carotid siphons. No hyperdense vessel. Skull: No acute or aggressive finding. Orbits: Visualized orbits are symmetric. Sinuses: The visualized paranasal sinuses are clear. Other: Left frontal scalp hematoma with soft tissue swelling extending inferiorly over the supraorbital ridge and within the preseptal soft tissues along the superolateral aspect of the left orbit. Mastoid air cells are clear. CT CERVICAL SPINE FINDINGS Alignment: Reversal of the normal cervical lordosis. Trace anterolisthesis of C3 on C4. No facet subluxation or dislocation. Skull base and vertebrae: No compression fracture or displaced fracture in the cervical spine. No suspicious osseous lesion. Soft tissues and spinal canal: No prevertebral fluid or swelling. No visible canal hematoma. Disc levels: Severe disc space narrowing at C3-4, C4-5, and C5-6. Disc osteophyte complexes at multiple levels. No high-grade osseous spinal canal stenosis. Facet arthrosis throughout the cervical spine. Foraminal narrowing is most pronounced at C4-5. Upper chest: Emphysema in the lung apices. Other: None. IMPRESSION: No CT evidence of acute intracranial abnormality. Left frontal scalp hematoma. Partial extension  of soft tissue swelling into the preseptal soft tissues of the left orbit. No acute fracture or traumatic malalignment of the cervical spine. Degenerative changes as above. Emphysema in the lung apices. Electronically Signed   By: Emily Filbert M.D.   On: 07/18/2023 10:31   CT Cervical Spine Wo Contrast Result Date: 07/18/2023 CLINICAL DATA:  Trauma, fall at of bed, hematoma above left eye. EXAM: CT HEAD WITHOUT CONTRAST CT CERVICAL SPINE WITHOUT CONTRAST TECHNIQUE: Multidetector CT imaging of the head and cervical spine was performed following the standard protocol without intravenous contrast. Multiplanar CT image reconstructions of the cervical spine were also generated. RADIATION DOSE REDUCTION: This exam was performed according to the departmental dose-optimization program which includes automated exposure control, adjustment of the mA and/or kV according to patient size and/or use of iterative reconstruction technique. COMPARISON:  None Available. FINDINGS: CT HEAD FINDINGS Brain: No acute intracranial hemorrhage. No CT evidence of  acute infarct. Nonspecific hypoattenuation in the periventricular and subcortical white matter favored to reflect chronic microvascular ischemic changes. 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 No edema, mass effect, or midline shift. The basilar cisterns are patent. Ventricles: Prominence of the ventricles suggesting underlying parenchymal volume loss. Vascular: Atherosclerotic calcifications of the carotid siphons. No hyperdense vessel. Skull: No acute or aggressive finding. Orbits: Visualized orbits are symmetric. Sinuses: The visualized paranasal sinuses are clear. Other: Left frontal scalp hematoma with soft tissue swelling extending inferiorly over the supraorbital ridge and within the preseptal soft tissues along the superolateral aspect of the left orbit. Mastoid air cells are clear. CT CERVICAL SPINE FINDINGS  Alignment: Reversal of the normal cervical lordosis. Trace anterolisthesis of C3 on C4. No facet subluxation or dislocation. Skull base and vertebrae: No compression fracture or displaced fracture in the cervical spine. No suspicious osseous lesion. Soft tissues and spinal canal: No prevertebral fluid or swelling. No visible canal hematoma. Disc levels: Severe disc space narrowing at C3-4, C4-5, and C5-6. Disc osteophyte complexes at multiple levels. No high-grade osseous spinal canal stenosis. Facet arthrosis throughout the cervical spine. Foraminal narrowing is most pronounced at C4-5. Upper chest: Emphysema in the lung apices. Other: None. IMPRESSION: No CT evidence of acute intracranial abnormality. Left frontal scalp hematoma. Partial extension of soft tissue swelling into the preseptal soft tissues of the left orbit. No acute fracture or traumatic malalignment of the cervical spine. Degenerative changes as above. Emphysema in the lung apices. Electronically Signed   By: Emily Filbert M.D.   On: 07/18/2023 10:31   DG Tibia/Fibula Left Result Date: 07/18/2023 CLINICAL DATA:  Anterior purulent lesion over the shin. EXAM: LEFT TIBIA AND FIBULA - 2 VIEW COMPARISON:  None Available. FINDINGS: Moderate degenerative changes are present in the medial compartment of the knee. Small joint effusion may be present at the knee. No acute fractures or focal osseous lesions are present. Edematous changes and possible fluid collection are noted just distal to the lateral malleolus. Underlying infection is not excluded. IMPRESSION: 1. Edematous changes and possible fluid collection just distal to the lateral malleolus. Underlying infection is not excluded. 2. Moderate degenerative changes in the medial compartment of the knee. Electronically Signed   By: Marin Roberts M.D.   On: 07/18/2023 09:52   DG Foot Complete Right Result Date: 07/18/2023 CLINICAL DATA:  Slid out of bed. Serial and lesion lateral to the lateral  malleolus. EXAM: RIGHT FOOT COMPLETE - 3 VIEW COMPARISON:  None Available. FINDINGS: Focal soft tissue swelling and ulceration is evident lateral to the right ankle. No acute or healing bone lesion is present. A plantar calcaneal spur is present. No radiopaque foreign body is present. The foot is otherwise unremarkable. IMPRESSION: 1. Focal soft tissue swelling and ulceration lateral to the right ankle. 2. No acute or healing bone lesion. 3. Plantar calcaneal spur. Electronically Signed   By: Marin Roberts M.D.   On: 07/18/2023 09:50   DG Chest Portable 1 View Result Date: 07/18/2023 CLINICAL DATA:  Slid out of bed. EXAM: PORTABLE CHEST 1 VIEW COMPARISON:  None Available. FINDINGS: The heart size and mediastinal contours are within normal limits. Both lungs are clear. The visualized skeletal structures are unremarkable. IMPRESSION: No active disease. Electronically Signed   By: Marin Roberts M.D.   On: 07/18/2023 09:47    PROCEDURES and INTERVENTIONS:  .1-3 Lead EKG Interpretation  Performed by: Delton Prairie, MD Authorized by: Delton Prairie, MD  Interpretation: abnormal     ECG rate:  120   ECG rate assessment: tachycardic     Rhythm: sinus tachycardia     Ectopy: none     Conduction: normal   .Critical Care  Performed by: Delton Prairie, MD Authorized by: Delton Prairie, MD   Critical care provider statement:    Critical care time (minutes):  30   Critical care time was exclusive of:  Separately billable procedures and treating other patients   Critical care was necessary to treat or prevent imminent or life-threatening deterioration of the following conditions:  Sepsis   Critical care was time spent personally by me on the following activities:  Development of treatment plan with patient or surrogate, discussions with consultants, evaluation of patient's response to treatment, examination of patient, ordering and review of laboratory studies, ordering and review of radiographic  studies, ordering and performing treatments and interventions, pulse oximetry, re-evaluation of patient's condition and review of old charts   Medications  cefTRIAXone (ROCEPHIN) 2 g in sodium chloride 0.9 % 100 mL IVPB (has no administration in time range)  vancomycin (VANCOREADY) IVPB 2000 mg/400 mL (has no administration in time range)  lactated ringers bolus 1,000 mL (1,000 mLs Intravenous New Bag/Given 07/18/23 1007)     IMPRESSION / MDM / ASSESSMENT AND PLAN / ED COURSE  I reviewed the triage vital signs and the nursing notes.  Differential diagnosis includes, but is not limited to, skull fracture, ICH, sepsis from skin and soft tissue infections, AKI  {Patient presents with symptoms of an acute illness or injury that is potentially life-threatening.  Patient presents after a fall out of bed with a forehead hematoma but noted to be significantly tachycardic and concerned about underlying medical illnesses such as sepsis from skin and soft tissue infection primarily.  We will obtain x-rays and CT scans as above.  Send screening blood work alongside a lactic acid, provide IV fluids and reassess.  He does not want to be admitted but I informed her on my initial evaluation that she may need this today.  Blood work resulting with AKI but normal lactic acid and normal WBC.  Reassuring imaging.  We will provide ceftriaxone and vancomycin due to her purulent wounds.  Consult with medicine for admission.  Clinical Course as of 07/18/23 1116  Wed Jul 18, 2023  1110 Reassessed and discussed metabolic panel with significant AKI, renal failure, concerned about infection, need for antibiotics and I recommend medical admission.  She is agreeable. [DS]    Clinical Course User Index [DS] Delton Prairie, MD     FINAL CLINICAL IMPRESSION(S) / ED DIAGNOSES   Final diagnoses:  Fall, initial encounter  Injury of head, initial encounter  AKI (acute kidney injury) (HCC)  Abscess of left lower leg      Rx / DC Orders   ED Discharge Orders     None        Note:  This document was prepared using Dragon voice recognition software and may include unintentional dictation errors.   Delton Prairie, MD 07/18/23 1116

## 2023-07-18 NOTE — ED Triage Notes (Signed)
 Pt to ED from home via [facility] for mechanical fall, slid out of bed this AM and hit forehead on table. Slight swelling noted to L forehead. Pt in NAD. No thinners, no LOC.

## 2023-07-18 NOTE — Consult Note (Addendum)
 WOC Nurse Consult Note: Reason for Consult: Consult requested for bilat shins and right ankle. Performed remotely after review of progress notes and photos in the EMR.  Right outer ankle with chronic full thickness wound, 100% yellow and moist. Left and right shins with chronic full thickness wounds, 50% red, 50% yellow, mod amt yellow drainage.  Topical treatment orders provided for bedside nurses to perform as follows to assist with removal of nonviable tissue: Interchangeable with TheraHoney Apply Medihoney to right outer ankle and bilat shins Q day, then cover with foam dressings.  Change foam dressings Q 3 days or PRN soiling. Please re-consult if further assistance is needed.  Thank-you,  Cammie Mcgee MSN, RN, CWOCN, [place], CNS [phone]

## 2023-07-19 DIAGNOSIS — L02416 Cutaneous abscess of left lower limb: Secondary | ICD-10-CM | POA: Diagnosis present

## 2023-07-19 DIAGNOSIS — E860 Dehydration: Secondary | ICD-10-CM | POA: Diagnosis present

## 2023-07-19 DIAGNOSIS — N179 Acute kidney failure, unspecified: Secondary | ICD-10-CM | POA: Diagnosis present

## 2023-07-19 DIAGNOSIS — E875 Hyperkalemia: Secondary | ICD-10-CM | POA: Diagnosis present

## 2023-07-19 DIAGNOSIS — Z8249 Family history of ischemic heart disease and other diseases of the circulatory system: Secondary | ICD-10-CM | POA: Diagnosis not present

## 2023-07-19 DIAGNOSIS — L97829 Non-pressure chronic ulcer of other part of left lower leg with unspecified severity: Secondary | ICD-10-CM | POA: Diagnosis present

## 2023-07-19 DIAGNOSIS — M79604 Pain in right leg: Secondary | ICD-10-CM | POA: Diagnosis not present

## 2023-07-19 DIAGNOSIS — E785 Hyperlipidemia, unspecified: Secondary | ICD-10-CM | POA: Diagnosis present

## 2023-07-19 DIAGNOSIS — Z833 Family history of diabetes mellitus: Secondary | ICD-10-CM | POA: Diagnosis not present

## 2023-07-19 DIAGNOSIS — G8929 Other chronic pain: Secondary | ICD-10-CM | POA: Diagnosis present

## 2023-07-19 DIAGNOSIS — J4489 Other specified chronic obstructive pulmonary disease: Secondary | ICD-10-CM | POA: Diagnosis present

## 2023-07-19 DIAGNOSIS — R000 Tachycardia, unspecified: Secondary | ICD-10-CM | POA: Diagnosis not present

## 2023-07-19 DIAGNOSIS — L03116 Cellulitis of left lower limb: Secondary | ICD-10-CM | POA: Diagnosis present

## 2023-07-19 DIAGNOSIS — L304 Erythema intertrigo: Secondary | ICD-10-CM | POA: Diagnosis present

## 2023-07-19 DIAGNOSIS — E871 Hypo-osmolality and hyponatremia: Secondary | ICD-10-CM | POA: Diagnosis present

## 2023-07-19 DIAGNOSIS — Y92009 Unspecified place in unspecified non-institutional (private) residence as the place of occurrence of the external cause: Secondary | ICD-10-CM | POA: Diagnosis not present

## 2023-07-19 DIAGNOSIS — A084 Viral intestinal infection, unspecified: Secondary | ICD-10-CM | POA: Diagnosis present

## 2023-07-19 DIAGNOSIS — I10 Essential (primary) hypertension: Secondary | ICD-10-CM | POA: Diagnosis present

## 2023-07-19 DIAGNOSIS — W06XXXA Fall from bed, initial encounter: Secondary | ICD-10-CM | POA: Diagnosis present

## 2023-07-19 DIAGNOSIS — D509 Iron deficiency anemia, unspecified: Secondary | ICD-10-CM | POA: Diagnosis present

## 2023-07-19 DIAGNOSIS — Z87891 Personal history of nicotine dependence: Secondary | ICD-10-CM | POA: Diagnosis not present

## 2023-07-19 DIAGNOSIS — R296 Repeated falls: Secondary | ICD-10-CM | POA: Diagnosis present

## 2023-07-19 DIAGNOSIS — L97819 Non-pressure chronic ulcer of other part of right lower leg with unspecified severity: Secondary | ICD-10-CM | POA: Diagnosis present

## 2023-07-19 DIAGNOSIS — Z1152 Encounter for screening for COVID-19: Secondary | ICD-10-CM | POA: Diagnosis not present

## 2023-07-19 DIAGNOSIS — M79605 Pain in left leg: Secondary | ICD-10-CM | POA: Diagnosis not present

## 2023-07-19 DIAGNOSIS — R600 Localized edema: Secondary | ICD-10-CM | POA: Diagnosis not present

## 2023-07-19 DIAGNOSIS — I2510 Atherosclerotic heart disease of native coronary artery without angina pectoris: Secondary | ICD-10-CM | POA: Diagnosis present

## 2023-07-19 DIAGNOSIS — D696 Thrombocytopenia, unspecified: Secondary | ICD-10-CM | POA: Diagnosis present

## 2023-07-19 DIAGNOSIS — Z7951 Long term (current) use of inhaled steroids: Secondary | ICD-10-CM | POA: Diagnosis not present

## 2023-07-19 LAB — IRON AND TIBC
Iron: 22 ug/dL — ABNORMAL LOW (ref 28–170)
Saturation Ratios: 13 % (ref 10.4–31.8)
TIBC: 169 ug/dL — ABNORMAL LOW (ref 250–450)
UIBC: 147 ug/dL

## 2023-07-19 LAB — CBC
HCT: 32.1 % — ABNORMAL LOW (ref 36.0–46.0)
Hemoglobin: 9.9 g/dL — ABNORMAL LOW (ref 12.0–15.0)
MCH: 25.8 pg — ABNORMAL LOW (ref 26.0–34.0)
MCHC: 30.8 g/dL (ref 30.0–36.0)
MCV: 83.8 fL (ref 80.0–100.0)
Platelets: 161 10*3/uL (ref 150–400)
RBC: 3.83 MIL/uL — ABNORMAL LOW (ref 3.87–5.11)
RDW: 16.6 % — ABNORMAL HIGH (ref 11.5–15.5)
WBC: 8.1 10*3/uL (ref 4.0–10.5)
nRBC: 0 % (ref 0.0–0.2)

## 2023-07-19 LAB — BASIC METABOLIC PANEL WITH GFR
Anion gap: 12 (ref 5–15)
BUN: 87 mg/dL — ABNORMAL HIGH (ref 8–23)
CO2: 16 mmol/L — ABNORMAL LOW (ref 22–32)
Calcium: 8.9 mg/dL (ref 8.9–10.3)
Chloride: 109 mmol/L (ref 98–111)
Creatinine, Ser: 2.26 mg/dL — ABNORMAL HIGH (ref 0.44–1.00)
GFR, Estimated: 22 mL/min — ABNORMAL LOW (ref >60)
Glucose, Bld: 74 mg/dL (ref 70–99)
Potassium: 5.2 mmol/L — ABNORMAL HIGH (ref 3.5–5.1)
Sodium: 137 mmol/L (ref 135–145)

## 2023-07-19 LAB — RESP PANEL BY RT-PCR (RSV, FLU A&B, COVID)  RVPGX2
Influenza A by PCR: NEGATIVE
Influenza B by PCR: NEGATIVE
Resp Syncytial Virus by PCR: NEGATIVE
SARS Coronavirus 2 by RT PCR: NEGATIVE

## 2023-07-19 LAB — VITAMIN B12: Vitamin B-12: 3791 pg/mL — ABNORMAL HIGH (ref 180–914)

## 2023-07-19 LAB — RETICULOCYTES
Immature Retic Fract: 16.5 % — ABNORMAL HIGH (ref 2.3–15.9)
RBC.: 4.05 MIL/uL (ref 3.87–5.11)
Retic Count, Absolute: 56.3 10*3/uL (ref 19.0–186.0)
Retic Ct Pct: 1.4 % (ref 0.4–3.1)

## 2023-07-19 LAB — FERRITIN: Ferritin: 290 ng/mL (ref 11–307)

## 2023-07-19 LAB — FOLATE: Folate: >40 ng/mL (ref >5.9)

## 2023-07-19 MED ORDER — SODIUM ZIRCONIUM CYCLOSILICATE 10 G PO PACK
10.0000 g | PACK | Freq: Once | ORAL | Status: AC
  Administered 2023-07-19: 10 g via ORAL
  Filled 2023-07-19: qty 1

## 2023-07-19 MED ORDER — NYSTATIN 100000 UNIT/GM EX POWD
Freq: Three times a day (TID) | CUTANEOUS | Status: DC
  Filled 2023-07-19: qty 15

## 2023-07-19 NOTE — ED Notes (Addendum)
 Pt running sinus tachy attending notified, see new orders.

## 2023-07-19 NOTE — ED Notes (Signed)
 Pt offered pericare and bed bath, declines at this time.

## 2023-07-19 NOTE — ED Notes (Signed)
 Lab pulling blood from tubes drawn earlier, SST and swab needed by this RN.

## 2023-07-19 NOTE — Progress Notes (Signed)
 PROGRESS NOTE    Bethany Lopez  ZOX:[id number] DOB: November 06, 1950 DOA: 07/18/2023 PCP: Corwin Levins, MD  Chief Complaint  Patient presents with   [facility] Course:  Bethany Lopez is 73 y.o. female with hypertension, hyperlipidemia, morbid obesity, chronic lymphedema, asthma/COPD, chronic iron deficiency anemia, chronic thrombocytopenia, who presented with generalized weakness, poor appetite, dark-colored urine, and frequent falls.  Patient endorsed that her symptoms began 2 days prior accompanied by intermittent diarrhea.  Denies any nausea or vomiting.  In the ED she was found to be tachycardic, blood pressure 108/63.  Trauma scans for CT head and neck were negative for fracture.  Physical exam revealed left leg cellulitis.  Blood work revealed creatinine of 3.8, hyperkalemia 5.4.  Patient received IV fluid bolus, vancomycin, ceftriaxone.  Subjective: On evaluation this morning patient reports she is feeling much better.  She is now more tolerant of p.o.  Denies having any further diarrhea. When discussing her leg wounds that she reports that they feel better and look better than they have in a long time.  She does not consistently follow with anyone outpatient for this.   Objective: Vitals:   07/19/23 0430 07/19/23 0600 07/19/23 0730 07/19/23 0755  BP: 108/69 91/61 (!) 100/46   Pulse: (!) 114 (!) 113 (!) 109   Resp: 18 18 19    Temp:  98.1 F (36.7 C)  98.3 F (36.8 C)  TempSrc:  Oral  Oral  SpO2: 100% 97% 97%   Weight:      Height:        Intake/Output Summary (Last 24 hours) at 07/19/2023 0803 Last data filed at 07/19/2023 0755 Gross per 24 hour  Intake --  Output 300 ml  Net -300 ml   Filed Weights   07/18/23 0826  Weight: 111.1 kg    Examination: General exam: Appears calm and comfortable, NAD  Respiratory system: No work of breathing, symmetric chest wall expansion Cardiovascular system: S1 & S2 heard, RRR.  Gastrointestinal system: Abdomen  is nondistended, soft and nontender.  Neuro: Alert and oriented. No focal neurological deficits. Extremities: Lower extremities are freshly bandaged, no oozing, bleeding or soaking of the bandage.  No surrounding erythema, bilateral shins are mildly tender to palpation. Psychiatry: Demonstrates appropriate judgement and insight. Mood & affect appropriate for situation.  Breast folds:   Assessment & Plan:  Principal Problem:   AKI (acute kidney injury) (HCC)    AKI - Likely prerenal in setting of dehydration - Baseline creatinine 0.95, creatinine 3.82 on arrival. - Continue IV fluids, creatinine is downtrending now - Renal ultrasound: Within normal limits. - CK is mildly elevated  Diarrhea - Now resolved.  No further episodes since admitted.  Suspect viral gastroenteritis.  Rash under breast folds - Likely intertrigo. -- Nystatin power - Continue moisture wicking, keep dry. - Wound care consult.  Tachycardia - May be secondary to dehydration, improving some now. -- Flu/RSV/COVID negative -- Chest CT negative on arrival for PE -- Is sinus tach, occasional PVCs  Chronic lymphedema ulcer and bilateral shin areas - Worse on left than right, no signs of active infection - Wound care team consulted - Patient received ceftriaxone and vancomycin in the ED - Will discontinue further antibiotics given no concerns for active infection at this time. Patient reports this is the best her legs have ever looked. - Will need outpatient follow-up with wound clinic  Normocytic anemia - Hemoglobin 11.3 on arrival, down trended to 9.9.  Suspect this downtrend  reflects hemodilution though given patient's contraction on arrival she is likely closer to her baseline now. - Iron mildly low.  TIBC low  Thrombocytopenia - Chronic.  Stable. - Trend CBC  Hypertension - Blood pressures low/normal on arrival.  Hold antihypertensives for now - Suspect low blood pressure secondary to dehydration and  volume contraction  Hyperlipidemia - Continue statin  Asthma/COPD - Not in exacerbation at this time - As needed inhalers  Morbid obesity BMI 39 - Outpatient follow up for lifestyle modification and risk factor management  DVT prophylaxis: Heparin   Code Status: Full Code Family Communication:  Discussed directly with patient Disposition: Will admit to inpatient for ongoing AKI.  Continued hospitalization for creatinine monitoring, IV fluids.   Consultants:    Procedures:    Antimicrobials:  Anti-infectives (From admission, onward)    Start     Dose/Rate Route Frequency Ordered Stop   07/18/23 1115  cefTRIAXone (ROCEPHIN) 2 g in sodium chloride 0.9 % 100 mL IVPB        2 g 200 mL/hr over 30 Minutes Intravenous  Once 07/18/23 1111 07/18/23 1154   07/18/23 1115  vancomycin (VANCOREADY) IVPB 2000 mg/400 mL        2,000 mg 200 mL/hr over 120 Minutes Intravenous  Once 07/18/23 1111 07/18/23 1511       Data Reviewed: I have personally reviewed following labs and imaging studies CBC: Recent Labs  Lab 07/18/23 0955 07/19/23 0429  WBC 9.2 8.1  NEUTROABS 7.8*  --   HGB 11.3* 9.9*  HCT 34.9* 32.1*  MCV 79.1* 83.8  PLT 167 161   Basic Metabolic Panel: Recent Labs  Lab 07/18/23 0955 07/19/23 0429  NA 131* 137  K 5.4* 5.2*  CL 103 109  CO2 16* 16*  GLUCOSE 96 74  BUN 104* 87*  CREATININE 3.82* 2.26*  CALCIUM 9.1 8.9   GFR: Estimated Creatinine Clearance: 27.6 mL/min (A) (by C-G formula based on SCr of 2.26 mg/dL (H)). Liver Function Tests: No results for input(s): "AST", "ALT", "ALKPHOS", "BILITOT", "PROT", "ALBUMIN" in the last 168 hours. CBG: No results for input(s): "GLUCAP" in the last 168 hours.  No results found for this or any previous visit (from the past 240 hours).   Radiology Studies: US RENAL Result Date: 07/18/2023 CLINICAL DATA:  AKI. EXAM: RENAL / URINARY TRACT ULTRASOUND COMPLETE COMPARISON:  None Available. FINDINGS: Right Kidney: Renal  measurements: 9.0 x 4.9 x 3.9 cm = volume: 91 mL. Echogenicity within normal limits. No gross abnormality. Left Kidney: Renal measurements: 8.7 x 5.2 x 5.3 cm = volume: 124 mL. Echogenicity within normal limits. No gross abnormality. Bladder: Appears normal for degree of bladder distention. Other: None. IMPRESSION: No acute sonographic abnormality. Electronically Signed   By: Hart Robinsons M.D.   On: 07/18/2023 15:40   CT HEAD WO CONTRAST ( ) Result Date: 07/18/2023 CLINICAL DATA:  Trauma, fall at of bed, hematoma above left eye. EXAM: CT HEAD WITHOUT CONTRAST CT CERVICAL SPINE WITHOUT CONTRAST TECHNIQUE: Multidetector CT imaging of the head and cervical spine was performed following the standard protocol without intravenous contrast. Multiplanar CT image reconstructions of the cervical spine were also generated. RADIATION DOSE REDUCTION: This exam was performed according to the departmental dose-optimization program which includes automated exposure control, adjustment of the mA and/or kV according to patient size and/or use of iterative reconstruction technique. COMPARISON:  None Available. FINDINGS: CT HEAD FINDINGS Brain: No acute intracranial hemorrhage. No CT evidence of acute infarct. Nonspecific hypoattenuation in the periventricular  and subcortical white matter favored to reflect chronic microvascular ischemic changes. 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 No edema, mass effect, or midline shift. The basilar cisterns are patent. Ventricles: Prominence of the ventricles suggesting underlying parenchymal volume loss. Vascular: Atherosclerotic calcifications of the carotid siphons. No hyperdense vessel. Skull: No acute or aggressive finding. Orbits: Visualized orbits are symmetric. Sinuses: The visualized paranasal sinuses are clear. Other: Left frontal scalp hematoma with soft tissue swelling extending inferiorly over the supraorbital ridge and  within the preseptal soft tissues along the superolateral aspect of the left orbit. Mastoid air cells are clear. CT CERVICAL SPINE FINDINGS Alignment: Reversal of the normal cervical lordosis. Trace anterolisthesis of C3 on C4. No facet subluxation or dislocation. Skull base and vertebrae: No compression fracture or displaced fracture in the cervical spine. No suspicious osseous lesion. Soft tissues and spinal canal: No prevertebral fluid or swelling. No visible canal hematoma. Disc levels: Severe disc space narrowing at C3-4, C4-5, and C5-6. Disc osteophyte complexes at multiple levels. No high-grade osseous spinal canal stenosis. Facet arthrosis throughout the cervical spine. Foraminal narrowing is most pronounced at C4-5. Upper chest: Emphysema in the lung apices. Other: None. IMPRESSION: No CT evidence of acute intracranial abnormality. Left frontal scalp hematoma. Partial extension of soft tissue swelling into the preseptal soft tissues of the left orbit. No acute fracture or traumatic malalignment of the cervical spine. Degenerative changes as above. Emphysema in the lung apices. Electronically Signed   By: Emily Filbert M.D.   On: 07/18/2023 10:31   CT Cervical Spine Wo Contrast Result Date: 07/18/2023 CLINICAL DATA:  Trauma, fall at of bed, hematoma above left eye. EXAM: CT HEAD WITHOUT CONTRAST CT CERVICAL SPINE WITHOUT CONTRAST TECHNIQUE: Multidetector CT imaging of the head and cervical spine was performed following the standard protocol without intravenous contrast. Multiplanar CT image reconstructions of the cervical spine were also generated. RADIATION DOSE REDUCTION: This exam was performed according to the departmental dose-optimization program which includes automated exposure control, adjustment of the mA and/or kV according to patient size and/or use of iterative reconstruction technique. COMPARISON:  None Available. FINDINGS: CT HEAD FINDINGS Brain: No acute intracranial hemorrhage. No CT  evidence of acute infarct. Nonspecific hypoattenuation in the periventricular and subcortical white matter favored to reflect chronic microvascular ischemic changes. 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 No edema, mass effect, or midline shift. The basilar cisterns are patent. Ventricles: Prominence of the ventricles suggesting underlying parenchymal volume loss. Vascular: Atherosclerotic calcifications of the carotid siphons. No hyperdense vessel. Skull: No acute or aggressive finding. Orbits: Visualized orbits are symmetric. Sinuses: The visualized paranasal sinuses are clear. Other: Left frontal scalp hematoma with soft tissue swelling extending inferiorly over the supraorbital ridge and within the preseptal soft tissues along the superolateral aspect of the left orbit. Mastoid air cells are clear. CT CERVICAL SPINE FINDINGS Alignment: Reversal of the normal cervical lordosis. Trace anterolisthesis of C3 on C4. No facet subluxation or dislocation. Skull base and vertebrae: No compression fracture or displaced fracture in the cervical spine. No suspicious osseous lesion. Soft tissues and spinal canal: No prevertebral fluid or swelling. No visible canal hematoma. Disc levels: Severe disc space narrowing at C3-4, C4-5, and C5-6. Disc osteophyte complexes at multiple levels. No high-grade osseous spinal canal stenosis. Facet arthrosis throughout the cervical spine. Foraminal narrowing is most pronounced at C4-5. Upper chest: Emphysema in the lung apices. Other: None. IMPRESSION: No CT evidence  of acute intracranial abnormality. Left frontal scalp hematoma. Partial extension of soft tissue swelling into the preseptal soft tissues of the left orbit. No acute fracture or traumatic malalignment of the cervical spine. Degenerative changes as above. Emphysema in the lung apices. Electronically Signed   By: Emily Filbert M.D.   On: 07/18/2023 10:31   DG Tibia/Fibula  Left Result Date: 07/18/2023 CLINICAL DATA:  Anterior purulent lesion over the shin. EXAM: LEFT TIBIA AND FIBULA - 2 VIEW COMPARISON:  None Available. FINDINGS: Moderate degenerative changes are present in the medial compartment of the knee. Small joint effusion may be present at the knee. No acute fractures or focal osseous lesions are present. Edematous changes and possible fluid collection are noted just distal to the lateral malleolus. Underlying infection is not excluded. IMPRESSION: 1. Edematous changes and possible fluid collection just distal to the lateral malleolus. Underlying infection is not excluded. 2. Moderate degenerative changes in the medial compartment of the knee. Electronically Signed   By: Marin Roberts M.D.   On: 07/18/2023 09:52   DG Foot Complete Right Result Date: 07/18/2023 CLINICAL DATA:  Slid out of bed. Serial and lesion lateral to the lateral malleolus. EXAM: RIGHT FOOT COMPLETE - 3 VIEW COMPARISON:  None Available. FINDINGS: Focal soft tissue swelling and ulceration is evident lateral to the right ankle. No acute or healing bone lesion is present. A plantar calcaneal spur is present. No radiopaque foreign body is present. The foot is otherwise unremarkable. IMPRESSION: 1. Focal soft tissue swelling and ulceration lateral to the right ankle. 2. No acute or healing bone lesion. 3. Plantar calcaneal spur. Electronically Signed   By: Marin Roberts M.D.   On: 07/18/2023 09:50   DG Chest Portable 1 View Result Date: 07/18/2023 CLINICAL DATA:  Slid out of bed. EXAM: PORTABLE CHEST 1 VIEW COMPARISON:  None Available. FINDINGS: The heart size and mediastinal contours are within normal limits. Both lungs are clear. The visualized skeletal structures are unremarkable. IMPRESSION: No active disease. Electronically Signed   By: Marin Roberts M.D.   On: 07/18/2023 09:47    Scheduled Meds:  aspirin EC  81 mg Oral Daily   atorvastatin  20 mg Oral Daily   budesonide  (PULMICORT) nebulizer solution  0.25 mg Nebulization BID   heparin  5,000 Units Subcutaneous Q12H   leptospermum manuka honey  1 Application Topical Daily   loratadine  10 mg Oral Daily   Continuous Infusions:  lactated ringers 125 mL/hr at 07/19/23 0249     LOS: 0 days  MDM: Patient is high risk for one or more organ failure.  They necessitate ongoing hospitalization for continued IV therapies and subsequent lab monitoring. Total time spent interpreting labs and vitals, coordinating care amongst consultants and care team members, directly assessing and discussing care with the patient and/or family: 55 min    Debarah Crape, DO [facility]  To contact the attending physician between 7A-7P please use Epic Chat. To contact the covering physician during after hours 7P-7A, please review Amion.   07/19/2023, 8:03 AM   *This document has been created with the assistance of dictation software. Please excuse typographical errors. *

## 2023-07-19 NOTE — ED Notes (Signed)
 Fall precautions in place for Pt. This RN placed fall band, fall grip socks, bed alarm and fall sign.

## 2023-07-19 NOTE — ED Notes (Signed)
Pt given breakfast tray and setup 

## 2023-07-19 NOTE — Care Management Obs Status (Signed)
 MEDICARE OBSERVATION STATUS NOTIFICATION   Patient Details  Name: Kalkidan Caudell MRN: [id number] Date of Birth: [birth year]   Medicare Observation Status Notification Given:  Orland Dec, CMA 07/19/2023, 1:40 PM

## 2023-07-20 ENCOUNTER — Inpatient Hospital Stay

## 2023-07-20 DIAGNOSIS — N179 Acute kidney failure, unspecified: Secondary | ICD-10-CM | POA: Diagnosis not present

## 2023-07-20 LAB — CBC WITH DIFFERENTIAL/PLATELET
Abs Immature Granulocytes: 0.03 10*3/uL (ref 0.00–0.07)
Basophils Absolute: 0 10*3/uL (ref 0.0–0.1)
Basophils Relative: 0 %
Eosinophils Absolute: 0.2 10*3/uL (ref 0.0–0.5)
Eosinophils Relative: 3 %
HCT: 29.6 % — ABNORMAL LOW (ref 36.0–46.0)
Hemoglobin: 9.8 g/dL — ABNORMAL LOW (ref 12.0–15.0)
Immature Granulocytes: 1 %
Lymphocytes Relative: 20 %
Lymphs Abs: 1.2 10*3/uL (ref 0.7–4.0)
MCH: 25.9 pg — ABNORMAL LOW (ref 26.0–34.0)
MCHC: 33.1 g/dL (ref 30.0–36.0)
MCV: 78.1 fL — ABNORMAL LOW (ref 80.0–100.0)
Monocytes Absolute: 0.9 10*3/uL (ref 0.1–1.0)
Monocytes Relative: 14 %
Neutro Abs: 3.7 10*3/uL (ref 1.7–7.7)
Neutrophils Relative %: 62 %
Platelets: 159 10*3/uL (ref 150–400)
RBC: 3.79 MIL/uL — ABNORMAL LOW (ref 3.87–5.11)
RDW: 16.4 % — ABNORMAL HIGH (ref 11.5–15.5)
WBC: 6 10*3/uL (ref 4.0–10.5)
nRBC: 0 % (ref 0.0–0.2)

## 2023-07-20 LAB — COMPREHENSIVE METABOLIC PANEL WITH GFR
ALT: 38 U/L (ref 0–44)
AST: 39 U/L (ref 15–41)
Albumin: 2.2 g/dL — ABNORMAL LOW (ref 3.5–5.0)
Alkaline Phosphatase: 82 U/L (ref 38–126)
Anion gap: 7 (ref 5–15)
BUN: 55 mg/dL — ABNORMAL HIGH (ref 8–23)
CO2: 20 mmol/L — ABNORMAL LOW (ref 22–32)
Calcium: 8.7 mg/dL — ABNORMAL LOW (ref 8.9–10.3)
Chloride: 114 mmol/L — ABNORMAL HIGH (ref 98–111)
Creatinine, Ser: 1.29 mg/dL — ABNORMAL HIGH (ref 0.44–1.00)
GFR, Estimated: 44 mL/min — ABNORMAL LOW (ref >60)
Glucose, Bld: 83 mg/dL (ref 70–99)
Potassium: 4.6 mmol/L (ref 3.5–5.1)
Sodium: 141 mmol/L (ref 135–145)
Total Bilirubin: 0.4 mg/dL (ref 0.0–1.2)
Total Protein: 6.4 g/dL — ABNORMAL LOW (ref 6.5–8.1)

## 2023-07-20 LAB — MAGNESIUM: Magnesium: 1.8 mg/dL (ref 1.7–2.4)

## 2023-07-20 LAB — PHOSPHORUS: Phosphorus: 3 mg/dL (ref 2.5–4.6)

## 2023-07-20 MED ORDER — AMLODIPINE BESYLATE 5 MG PO TABS
5.0000 mg | ORAL_TABLET | Freq: Every day | ORAL | Status: DC
  Administered 2023-07-20 – 2023-07-21 (×2): 5 mg via ORAL
  Filled 2023-07-20 (×2): qty 1

## 2023-07-20 NOTE — Evaluation (Signed)
 Physical Therapy Evaluation Patient Details Name: Bethany Lopez MRN: [id number] DOB: [birth year] Today's Date: 07/20/2023  History of Present Illness  Pt is a 73 y.o. female presenting to hospital 07/18/23 with c/o fall (slipped out of bed and struck her L forehead on nightstand).  Pt reports dyspnea on exertion preceding this and multiple wounds on her legs.  Pt with L frontal scalp hematoma.  Pt admitted with AKI and chronic lymphedema ulcer on B shin area.  PMH includes htn, HLD, morbid obesity, chronic lymphedema, asthma/COPD, chronic iron deficiency anemia, chronic thrombocytopenia, carpal tunnel syndrome.  Clinical Impression  Prior to recent medical concerns, pt reports being independent with ambulation; lives with her husband in 1 level home.  No c/o pain during session.  Currently pt is mod assist semi-supine to sitting EOB (pt reports her husband is able to assist her and she has an adjustable bed at home); min assist progressing to CGA with transfers (with walker use); CGA to ambulate 75 feet with bariatric RW use; and SBA sit to supine in bed.  Pt's HR 115 bpm at rest prior to ambulation; HR 142 bpm immediately post ambulation; and HR decreased to 124 bpm post ambulation (MD and nurse notified).  Pt would currently benefit from skilled PT to address noted impairments and functional limitations (see below for any additional details).  Upon hospital discharge, pt would benefit from ongoing therapy.     If plan is discharge home, recommend the following: A little help with walking and/or transfers;A little help with bathing/dressing/bathroom;Assistance with cooking/housework;Assist for transportation;Help with stairs or ramp for entrance   Can travel by private vehicle        Equipment Recommendations Rolling walker (2 wheels; bariatric);BSC/3in1 (bariatric)  Recommendations for Other Services       Functional Status Assessment Patient has had a recent decline in their functional  status and demonstrates the ability to make significant improvements in function in a reasonable and predictable amount of time.     Precautions / Restrictions Precautions Precautions: Fall Restrictions Weight Bearing Restrictions Per Provider Order: No      Mobility  Bed Mobility Overal bed mobility: Needs Assistance Bed Mobility: Supine to Sit, Sit to Supine     Supine to sit: Mod assist, HOB elevated, Used rails (assist for trunk) Sit to supine: Supervision, Used rails        Transfers Overall transfer level: Needs assistance Equipment used: Rolling walker (2 wheels) Transfers: Sit to/from Stand Sit to Stand: Min assist, Contact guard assist           General transfer comment: min assist 1st trial standing from bed but improved to CGA 2nd trial standing from bed; vc's for UE placement    Ambulation/Gait Ambulation/Gait assistance: Contact guard assist Gait Distance (Feet): 75 Feet Assistive device: Rolling walker (2 wheels) (bariatric) Gait Pattern/deviations: Step-through pattern, Decreased step length - right, Decreased step length - left Gait velocity: decreased     General Gait Details: steady ambulation with walker use  Stairs            Wheelchair Mobility     Tilt Bed    Modified Rankin (Stroke Patients Only)       Balance Overall balance assessment: Needs assistance Sitting-balance support: No upper extremity supported, Feet supported Sitting balance-Leahy Scale: Good Sitting balance - Comments: steady reaching within BOS   Standing balance support: Bilateral upper extremity supported, During functional activity, Reliant on assistive device for balance Standing balance-Leahy Scale: Good Standing balance comment:  steady ambulating with RW use                             Pertinent Vitals/Pain Pain Assessment Pain Assessment: No/denies pain SpO2 sats 94% or greater on room air when checked during session.    Home  Living Family/patient expects to be discharged to:: Private residence Living Arrangements: Spouse/significant other Available Help at Discharge: Family;Available PRN/intermittently Type of Home: House Home Access: Stairs to enter Entrance Stairs-Rails: Left Entrance Stairs-Number of Steps: 2   Home Layout: One level Home Equipment: Grab bars - tub/shower;Cane - single point      Prior Function Prior Level of Function : Independent/Modified Independent             Mobility Comments: Independent with ambulation; no additional recent falls reported.       Extremity/Trunk Assessment   Upper Extremity Assessment Upper Extremity Assessment: Overall WFL for tasks assessed    Lower Extremity Assessment Lower Extremity Assessment: Generalized weakness       Communication   Communication Communication: No apparent difficulties    Cognition Arousal: Alert Behavior During Therapy: WFL for tasks assessed/performed   PT - Cognitive impairments: No apparent impairments                       PT - Cognition Comments: A&Ox4 Following commands: Intact       Cueing Cueing Techniques: Verbal cues     General Comments  Pt agreeable to PT session.    Exercises     Assessment/Plan    PT Assessment Patient needs continued PT services  PT Problem List Decreased strength;Decreased activity tolerance;Decreased balance;Decreased mobility       PT Treatment Interventions DME instruction;Gait training;Stair training;Functional mobility training;Therapeutic activities;Therapeutic exercise;Balance training;Patient/family education    PT Goals (Current goals can be found in the Care Plan section)  Acute Rehab PT Goals Patient Stated Goal: to improve strength and walking PT Goal Formulation: With patient Time For Goal Achievement: 08/03/23 Potential to Achieve Goals: Good    Frequency Min 2X/week     Co-evaluation               AM-PAC PT "6 Clicks"  Mobility  Outcome Measure Help needed turning from your back to your side while in a flat bed without using bedrails?: A Little Help needed moving from lying on your back to sitting on the side of a flat bed without using bedrails?: A Lot Help needed moving to and from a bed to a chair (including a wheelchair)?: A Little Help needed standing up from a chair using your arms (e.g., wheelchair or bedside chair)?: A Little Help needed to walk in hospital room?: A Little Help needed climbing 3-5 steps with a railing? : A Little 6 Click Score: 17    End of Session Equipment Utilized During Treatment: Gait belt Activity Tolerance: Patient tolerated treatment well Patient left: in bed;with call bell/phone within reach;with bed alarm set Nurse Communication: Mobility status;Precautions;Other (comment) (Pt's HR during session) PT Visit Diagnosis: Other abnormalities of gait and mobility (R26.89);Muscle weakness (generalized) (M62.81);History of falling (Z91.81)    Time: 4098-1191 PT Time Calculation (min) (ACUTE ONLY): 38 min   Charges:   PT Evaluation $PT Eval Low Complexity: 1 Low PT Treatments $Gait Training: 8-22 mins $Therapeutic Activity: 8-22 mins PT General Charges $$ ACUTE PT VISIT: 1 Visit        Hendricks Limes, PT 07/20/23, 2:26  PM

## 2023-07-20 NOTE — Progress Notes (Signed)
 PROGRESS NOTE    Bethany Lopez  ZOX:[id number] DOB: 09-Jan-1951 DOA: 07/18/2023 PCP: Corwin Levins, MD  Chief Complaint  Patient presents with   [facility] Course:  Bethany Lopez is 73 y.o. female with hypertension, hyperlipidemia, morbid obesity, chronic lymphedema, asthma/COPD, chronic iron deficiency anemia, chronic thrombocytopenia, who presented with generalized weakness, poor appetite, dark-colored urine, and frequent falls.  Patient endorsed that her symptoms began 2 days prior accompanied by intermittent diarrhea.  Denies any nausea or vomiting.  In the ED she was found to be tachycardic, blood pressure 108/63.  Trauma scans for CT head and neck were negative for fracture.  Physical exam revealed left leg cellulitis.  Blood work revealed creatinine of 3.8, hyperkalemia 5.4.  Patient received IV fluid bolus, vancomycin, ceftriaxone.  Subjective: Patient reports overall she is feeling much better.  No further diarrhea.  Still endorsing leg pain.  Has required pain medications.  She reports her legs are not typically this painful even when swollen.  She was able to participate some with physical therapy.   Objective: Vitals:   07/20/23 0729 07/20/23 0751 07/20/23 1344 07/20/23 1519  BP:  127/65 114/66 (!) 141/64  Pulse:  (!) 109 (!) 102 (!) 110  Resp:  15 17 19   Temp:  98.7 F (37.1 C) 98.6 F (37 C) 98.2 F (36.8 C)  TempSrc:  Oral Oral Oral  SpO2: 97% 93% 100% 99%  Weight:      Height:        Intake/Output Summary (Last 24 hours) at 07/20/2023 1638 Last data filed at 07/20/2023 1538 Gross per 24 hour  Intake 0 ml  Output --  Net 0 ml   Filed Weights   07/18/23 0826  Weight: 111.1 kg    Examination: General exam: Appears calm and comfortable, NAD  Respiratory system: No work of breathing, symmetric chest wall expansion Cardiovascular system: S1 & S2 heard, RRR.  Gastrointestinal system: Abdomen is nondistended, soft and nontender.  Neuro:  Alert and oriented. No focal neurological deficits. Extremities: Lower extremities are freshly bandaged, no oozing, bleeding or soaking of the bandage.  No surrounding erythema, legs are bilaterally tender to minimal palpation. Psychiatry: Demonstrates appropriate judgement and insight. Mood & affect appropriate for situation.   Assessment & Plan:  Principal Problem:   AKI (acute kidney injury) (HCC)    AKI - Likely prerenal in setting of dehydration - Baseline creatinine 0.95, creatinine 3.82 on arrival. - Has been on IV fluids.  Creatinine has downtrended near baseline.  Will need close outpatient follow-up for repeat creatinine. - Encouraged p.o. intake - Renal ultrasound: Within normal limits. - CK is mildly elevated  Diarrhea - Now resolved.  No further episodes since admitted.  Suspect viral gastroenteritis.  Rash under breast folds - Consistent with intertrigo -- Nystatin power - Continue moisture wicking, keep dry. - Wound care consult.  Tachycardia - Though has been persistent.  Given worsening leg pain we will also perform lower extremity Dopplers today. -- Flu/RSV/COVID negative -- Chest CT negative on arrival for PE -- EKG: Sinus tach, occasional PVCs  Chronic lymphedema ulcer and bilateral shin areas - Worse on left than right, no signs of active infection.  Very tender to palpation.  Dopplers as above. - Wound care team consulted need close outpatient follow-up with wound clinic - Patient received ceftriaxone and vancomycin in the ED, but given no concerns of active infection these have been discontinued  Normocytic anemia - Hemoglobin 11.3 on arrival,  down trended to 9.9.  Suspect this downtrend reflects hemodilution though given patient's contraction on arrival she is likely closer to her baseline now. - Continue to trend hemoglobin on daily CBC - Iron mildly low.  TIBC low  Thrombocytopenia - Chronic.  Stable. - Trend CBC  Hypertension - Blood  pressures low/normal on arrival.  Hold antihypertensives for now-continue to monitor, titrate as needed - Suspect low blood pressure secondary to dehydration and volume contraction  Hyperlipidemia - Continue statin  Asthma/COPD - Not in exacerbation at this time - As needed inhalers  Morbid obesity BMI 39 - Outpatient follow up for lifestyle modification and risk factor management  DVT prophylaxis: Heparin   Code Status: Full Code Family Communication:  Discussed directly with patient Disposition: Initially planned for discharge today however patient has been persistently tachycardic with worsening leg pain.  Will perform Dopplers.  Home health and DME has been ordered and arranged  consultants:    Procedures:    Antimicrobials:  Anti-infectives (From admission, onward)    Start     Dose/Rate Route Frequency Ordered Stop   07/18/23 1115  cefTRIAXone (ROCEPHIN) 2 g in sodium chloride 0.9 % 100 mL IVPB        2 g 200 mL/hr over 30 Minutes Intravenous  Once 07/18/23 1111 07/18/23 1154   07/18/23 1115  vancomycin (VANCOREADY) IVPB 2000 mg/400 mL        2,000 mg 200 mL/hr over 120 Minutes Intravenous  Once 07/18/23 1111 07/18/23 1511       Data Reviewed: I have personally reviewed following labs and imaging studies CBC: Recent Labs  Lab 07/18/23 0955 07/19/23 0429 07/20/23 0504  WBC 9.2 8.1 6.0  NEUTROABS 7.8*  --  3.7  HGB 11.3* 9.9* 9.8*  HCT 34.9* 32.1* 29.6*  MCV 79.1* 83.8 78.1*  PLT 167 161 159   Basic Metabolic Panel: Recent Labs  Lab 07/18/23 0955 07/19/23 0429 07/20/23 0504  NA 131* 137 141  K 5.4* 5.2* 4.6  CL 103 109 114*  CO2 16* 16* 20*  GLUCOSE 96 74 83  BUN 104* 87* 55*  CREATININE 3.82* 2.26* 1.29*  CALCIUM 9.1 8.9 8.7*  MG  --   --  1.8  PHOS  --   --  3.0   GFR: Estimated Creatinine Clearance: 48.3 mL/min (A) (by C-G formula based on SCr of 1.29 mg/dL (H)). Liver Function Tests: Recent Labs  Lab 07/20/23 0504  AST 39  ALT 38   ALKPHOS 82  BILITOT 0.4  PROT 6.4*  ALBUMIN 2.2*   CBG: No results for input(s): "GLUCAP" in the last 168 hours.  Recent Results (from the past 240 hours)  Blood culture (routine x 2)     Status: None (Preliminary result)   Collection Time: 07/18/23  9:55 AM   Specimen: BLOOD  Result Value Ref Range Status   Specimen Description BLOOD RIGHT FA  Final   Special Requests   Final    BOTTLES DRAWN AEROBIC AND ANAEROBIC Blood Culture results may not be optimal due to an inadequate volume of blood received in culture bottles   Culture   Final    NO GROWTH 2 DAYS Performed at [facility], [address] [place], [place] [address]    Report Status PENDING  Incomplete  Blood culture (routine x 2)     Status: None (Preliminary result)   Collection Time: 07/18/23  9:56 AM   Specimen: BLOOD  Result Value Ref Range Status   Specimen  Description BLOOD RIGHT AC  Final   Special Requests   Final    BOTTLES DRAWN AEROBIC AND ANAEROBIC Blood Culture results may not be optimal due to an inadequate volume of blood received in culture bottles   Culture   Final    NO GROWTH 2 DAYS Performed at [facility], [address] [place], [place] [address]    Report Status PENDING  Incomplete  Resp panel by RT-PCR (RSV, Flu A&B, Covid) Anterior Nasal Swab     Status: None   Collection Time: 07/19/23  9:14 AM   Specimen: Anterior Nasal Swab  Result Value Ref Range Status   SARS Coronavirus 2 by RT PCR NEGATIVE NEGATIVE Final    Comment: (NOTE) SARS-CoV-2 target nucleic acids are NOT DETECTED.  The SARS-CoV-2 RNA is generally detectable in upper respiratory specimens during the acute phase of infection. The lowest concentration of SARS-CoV-2 viral copies this assay can detect is 138 copies/mL. A negative result does not preclude SARS-Cov-2 infection and should not be used as the sole basis for treatment or other patient management decisions. A negative result may occur with   improper specimen collection/handling, submission of specimen other than nasopharyngeal swab, presence of viral mutation(s) within the areas targeted by this assay, and inadequate number of viral copies(<138 copies/mL). A negative result must be combined with clinical observations, patient history, and epidemiological information. The expected result is Negative.  Fact Sheet for Patients:  BloggerCourse.com  Fact Sheet for Healthcare Providers:  SeriousBroker.it  This test is no t yet approved or cleared by the [place] FDA and  has been authorized for detection and/or diagnosis of SARS-CoV-2 by FDA under an Emergency Use Authorization (EUA). This EUA will remain  in effect (meaning this test can be used) for the duration of the COVID-19 declaration under Section 564(b)(1) of the Act, 21 U.S.C.section 360bbb-3(b)(1), unless the authorization is terminated  or revoked sooner.       Influenza A by PCR NEGATIVE NEGATIVE Final   Influenza B by PCR NEGATIVE NEGATIVE Final    Comment: (NOTE) The Xpert Xpress SARS-CoV-2/FLU/RSV plus assay is intended as an aid in the diagnosis of influenza from Nasopharyngeal swab specimens and should not be used as a sole basis for treatment. Nasal washings and aspirates are unacceptable for Xpert Xpress SARS-CoV-2/FLU/RSV testing.  Fact Sheet for Patients: BloggerCourse.com  Fact Sheet for Healthcare Providers: SeriousBroker.it  This test is not yet approved or cleared by the [place] FDA and has been authorized for detection and/or diagnosis of SARS-CoV-2 by FDA under an Emergency Use Authorization (EUA). This EUA will remain in effect (meaning this test can be used) for the duration of the COVID-19 declaration under Section 564(b)(1) of the Act, 21 U.S.C. section 360bbb-3(b)(1), unless the authorization is terminated or revoked.      Resp Syncytial Virus by PCR NEGATIVE NEGATIVE Final    Comment: (NOTE) Fact Sheet for Patients: BloggerCourse.com  Fact Sheet for Healthcare Providers: SeriousBroker.it  This test is not yet approved or cleared by the [place] FDA and has been authorized for detection and/or diagnosis of SARS-CoV-2 by FDA under an Emergency Use Authorization (EUA). This EUA will remain in effect (meaning this test can be used) for the duration of the COVID-19 declaration under Section 564(b)(1) of the Act, 21 U.S.C. section 360bbb-3(b)(1), unless the authorization is terminated or revoked.  Performed at [facility], [address] [place], [place] [address]      Radiology Studies: No results  found.   Scheduled Meds:  aspirin EC  81 mg Oral Daily   atorvastatin  20 mg Oral Daily   budesonide (PULMICORT) nebulizer solution  0.25 mg Nebulization BID   heparin  5,000 Units Subcutaneous Q12H   leptospermum manuka honey  1 Application Topical Daily   loratadine  10 mg Oral Daily   nystatin   Topical TID   Continuous Infusions:     LOS: 1 day  MDM: Patient is high risk for one or more organ failure.  They necessitate ongoing hospitalization for continued IV therapies and subsequent lab monitoring. Total time spent interpreting labs and vitals, coordinating care amongst consultants and care team members, directly assessing and discussing care with the patient and/or family: 55 min    Debarah Crape, DO [facility]  To contact the attending physician between 7A-7P please use Epic Chat. To contact the covering physician during after hours 7P-7A, please review Amion.   07/20/2023, 4:38 PM   *This document has been created with the assistance of dictation software. Please excuse typographical errors. *

## 2023-07-20 NOTE — TOC Initial Note (Signed)
 Transition of Care [facility]) - Initial/Assessment Note    Patient Details  Name: Bethany Lopez MRN: [id number] Date of Birth: November 23, 1950  [facility] of [facility]) CM/SW Contact:    Marlowe Sax, RN Phone Number: 07/20/2023, 2:14 PM  Clinical Narrative:                 Spoke with the patient and discussed DC plan and needs, Notified her that [employer] wioll deliver a 3 in 1 and RW to the bedside She is agreeable to [facility] and does not have a preference [facility] was sent the referral and attached their information to the AVS  Expected Discharge Plan: Home w Home Health Services Barriers to Discharge: Barriers Resolved   Patient Goals and CMS Choice            Expected Discharge Plan and Services   Discharge Planning Services: CM Consult   Living arrangements for the past 2 months: Single Family Home                 DME Arranged: 3-N-1, Walker rolling DME Agency: [employer] Date DME Agency Contacted: 07/20/23 Time DME Agency Contacted: [phone] Representative spoke with at DME Agency: Cletis [place] HH Arranged: PT, OT HH Agency: [facility] Date [facility] Agency Contacted: 07/20/23 Time HH Agency Contacted: 1411 Representative spoke with at [facility] Agency: Kandee Keen  Prior Living Arrangements/Services Living arrangements for the past 2 months: Single Family Home Lives with:: Spouse Patient language and need for interpreter reviewed:: Yes Do you feel safe going back to the place where you live?: Yes      Need for Family Participation in Patient Care: Yes (Comment) Care giver support system in place?: Yes (comment)   Criminal Activity/Legal Involvement Pertinent to Current Situation/Hospitalization: No - Comment as needed  Activities of Daily Living   ADL Screening (condition at time of admission) Independently performs ADLs?: Yes (appropriate for developmental age) Is the patient deaf or have difficulty hearing?: No Does the patient have difficulty seeing, even when wearing  glasses/contacts?: No Does the patient have difficulty concentrating, remembering, or making decisions?: No  Permission Sought/Granted Permission sought to share information with : Case Manager, Family Supports Permission granted to share information with : Yes, Verbal Permission Granted     Permission granted to share info w AGENCY: Home health        Emotional Assessment Appearance:: Appears stated age Attitude/Demeanor/Rapport: Engaged Affect (typically observed): Pleasant Orientation: : Oriented to Self, Oriented to Place, Oriented to  Time, Oriented to Situation Alcohol / Substance Use: Not Applicable Psych Involvement: No (comment)  Admission diagnosis:  AKI (acute kidney injury) (HCC) [N17.9] Injury of head, initial encounter [S09.90XA] Fall, initial encounter [W19.XXXA] Abscess of left lower leg [L02.416] Patient Active Problem List   Diagnosis Date Noted   AKI (acute kidney injury) (HCC) 07/18/2023   Hyperglycemia 10/26/2022   Arthritis of left knee 11/02/2021   Renal insufficiency 05/03/2021   Chronic pain of left knee 12/12/2018   Sciatica, left side 08/10/2016   Depression 08/10/2016   Asthma 02/10/2016   Asthma with acute exacerbation 02/10/2016   Thrombocytopenia (HCC) 08/12/2015   GERD (gastroesophageal reflux disease) 02/11/2015   Leg swelling 02/11/2015   Carpal tunnel syndrome 06/17/2014   Pain and swelling of toe of left foot 06/17/2014   Thrombocytopenia, unspecified (HCC) 08/07/2012   Heme positive stool 05/02/2012   Fatigue 05/02/2012   Allergic rhinitis 05/01/2012   Obesity 05/01/2012   Anemia, iron deficiency 04/30/2012   Encounter for  preventative adult health care exam with abnormal findings 12/01/2011   Tobacco use 08/02/2011   Hypertension 08/02/2011   Dermatitis 08/02/2011   Hyperlipidemia 05/30/2011   PCP:  Corwin Levins, MD Pharmacy:   [facility] [address] [place] - [address] [place]  [address] Phone: [phone] Fax: [phone]  [id number] G[place] Otto, [place] - [address] AT [facility] [address] [facility] [address] [place] [address] Phone: [phone] Fax: [phone]     Social Drivers of Health (SDOH) Social History: SDOH Screenings   Food Insecurity: No Food Insecurity (07/18/2023)  Housing: Low Risk  (07/18/2023)  Transportation Needs: No Transportation Needs (07/18/2023)  Utilities: Not At Risk (07/18/2023)  Alcohol Screen: Low Risk  (06/26/2023)  Depression (PHQ2-9): Low Risk  (06/26/2023)  Financial Resource Strain: Medium Risk (06/26/2023)  Physical Activity: Inactive (06/26/2023)  Social Connections: Moderately Integrated (07/18/2023)  Stress: No Stress Concern Present (06/26/2023)  Tobacco Use: High Risk (07/18/2023)  Health Literacy: Adequate Health Literacy (06/26/2023)   SDOH Interventions:     Readmission Risk Interventions     No data to display

## 2023-07-20 NOTE — Discharge Instructions (Signed)
 [facility] [address]  [place], [place]  [phone] Open 24 hours  They will call you to set up when they can come for assessment

## 2023-07-20 NOTE — Progress Notes (Signed)
 Patient is not able to walk the distance required to go the bathroom, or he/she is unable to safely negotiate stairs required to access the bathroom.  A 3in1 BSC will alleviate this problem

## 2023-07-21 DIAGNOSIS — N179 Acute kidney failure, unspecified: Secondary | ICD-10-CM | POA: Diagnosis not present

## 2023-07-21 LAB — CBC WITH DIFFERENTIAL/PLATELET
Abs Immature Granulocytes: 0.03 10*3/uL (ref 0.00–0.07)
Basophils Absolute: 0 10*3/uL (ref 0.0–0.1)
Basophils Relative: 1 %
Eosinophils Absolute: 0.2 10*3/uL (ref 0.0–0.5)
Eosinophils Relative: 4 %
HCT: 31.3 % — ABNORMAL LOW (ref 36.0–46.0)
Hemoglobin: 10 g/dL — ABNORMAL LOW (ref 12.0–15.0)
Immature Granulocytes: 1 %
Lymphocytes Relative: 24 %
Lymphs Abs: 1.5 10*3/uL (ref 0.7–4.0)
MCH: 25.7 pg — ABNORMAL LOW (ref 26.0–34.0)
MCHC: 31.9 g/dL (ref 30.0–36.0)
MCV: 80.5 fL (ref 80.0–100.0)
Monocytes Absolute: 0.9 10*3/uL (ref 0.1–1.0)
Monocytes Relative: 14 %
Neutro Abs: 3.5 10*3/uL (ref 1.7–7.7)
Neutrophils Relative %: 56 %
Platelets: 162 10*3/uL (ref 150–400)
RBC: 3.89 MIL/uL (ref 3.87–5.11)
RDW: 16.4 % — ABNORMAL HIGH (ref 11.5–15.5)
WBC: 6.2 10*3/uL (ref 4.0–10.5)
nRBC: 0 % (ref 0.0–0.2)

## 2023-07-21 LAB — COMPREHENSIVE METABOLIC PANEL WITH GFR
ALT: 35 U/L (ref 0–44)
AST: 32 U/L (ref 15–41)
Albumin: 2.3 g/dL — ABNORMAL LOW (ref 3.5–5.0)
Alkaline Phosphatase: 87 U/L (ref 38–126)
Anion gap: 8 (ref 5–15)
BUN: 38 mg/dL — ABNORMAL HIGH (ref 8–23)
CO2: 21 mmol/L — ABNORMAL LOW (ref 22–32)
Calcium: 8.9 mg/dL (ref 8.9–10.3)
Chloride: 113 mmol/L — ABNORMAL HIGH (ref 98–111)
Creatinine, Ser: 1.08 mg/dL — ABNORMAL HIGH (ref 0.44–1.00)
GFR, Estimated: 54 mL/min — ABNORMAL LOW (ref >60)
Glucose, Bld: 94 mg/dL (ref 70–99)
Potassium: 4.3 mmol/L (ref 3.5–5.1)
Sodium: 142 mmol/L (ref 135–145)
Total Bilirubin: 0.7 mg/dL (ref 0.0–1.2)
Total Protein: 6.4 g/dL — ABNORMAL LOW (ref 6.5–8.1)

## 2023-07-21 LAB — PHOSPHORUS: Phosphorus: 2.6 mg/dL (ref 2.5–4.6)

## 2023-07-21 LAB — MAGNESIUM: Magnesium: 1.5 mg/dL — ABNORMAL LOW (ref 1.7–2.4)

## 2023-07-21 MED ORDER — AMLODIPINE BESYLATE 5 MG PO TABS: 5.0000 mg | ORAL_TABLET | Freq: Every day | ORAL | 0 refills | Status: DC

## 2023-07-21 NOTE — Hospital Course (Signed)
 Bethany Lopez is 73 y.o. female with hypertension, hyperlipidemia, morbid obesity, chronic lymphedema, asthma/COPD, chronic iron deficiency anemia, chronic thrombocytopenia, who presented with generalized weakness, poor appetite, dark-colored urine, and frequent falls.  Patient endorsed that her symptoms began 2 days prior accompanied by intermittent diarrhea.  Denies any nausea or vomiting.  In the ED she was found to be tachycardic, blood pressure 108/63.  Trauma scans for CT head and neck were negative for fracture.  Physical exam lower extremity wounds.  Blood work revealed creatinine of 3.8, hyperkalemia 5.4.  Patient received IV fluid bolus, vancomycin, ceftriaxone.  She was admitted and started on IV fluids.  Creatinine gradually down trended towards her baseline.  Renal ultrasound was within normal limits.  Renal injury likely secondary to dehydration given her gastroenteritis prior to her arrival, she had no further bowel movements while admitted. Patient's losartan was held and she was initiated on amlodipine.  We have also recommended discontinuing all NSAIDs including Mobic which patient is prescribed for chronic pain. Patient's lower extremity wounds did not appear acutely infected and she did not require any further antibiotics.  She will follow-up with the wound care clinic, referral was placed at discharge. Patient worked with physical therapy and home health DME and home health PT was arranged prior to DC   AKI - Likely prerenal in setting of dehydration - Baseline creatinine 0.95, creatinine 3.82 on arrival. - Creatinine has downtrended near baseline.  Will need close outpatient follow-up for repeat creatinine. - Encouraged p.o. intake - Renal ultrasound: Within normal limits. - CK is mildly elevated   Diarrhea - Now resolved.  No further episodes since admitted.  Suspect viral gastroenteritis.   Rash under breast folds - Consistent with intertrigo -- Nystatin power -  Continue moisture wicking, keep dry. - Wound care consulted   Tachycardia -Resolved now - Dopplers negative for DVT -- Flu/RSV/COVID negative -- Chest CT negative on arrival for PE -- EKG: Sinus tach, occasional PVCs   Chronic lymphedema ulcer and bilateral shin areas - Worse on left than right, no signs of active infection.   - Wound care team consulted need close outpatient follow-up with wound clinic - Patient received ceftriaxone and vancomycin in the ED, but given no concerns of active infection these have been discontinued   Normocytic anemia -Stable.  Thrombocytopenia - Chronic.  Stable.  Hypertension -On ARB outpatient.  Hold for now given AKI.  Proceed with amlodipine for now.    Hyperlipidemia - Continue statin   Asthma/COPD - Not in exacerbation at this time - As needed inhalers   Morbid obesity BMI 39 - Outpatient follow up for lifestyle modification and risk factor management

## 2023-07-21 NOTE — Progress Notes (Signed)
  Progress Note   Date: 07/20/2023  Patient Name: Bethany Lopez        MRN#: [id number]  Review the patient's clinical findings supports the diagnosis of:   Hyponatremia

## 2023-07-21 NOTE — Progress Notes (Signed)
 Discharge instructions provided. All medications, follow up appointments, and discharge instructions provided. IV out. Discharging home.

## 2023-07-21 NOTE — Discharge Summary (Signed)
 Physician Discharge Summary   Patient: Bethany Lopez MRN: [id number] DOB: 1950/07/27  Admit date:     07/18/2023  Discharge date: 07/21/23  Discharge Physician: Debarah Crape   PCP: Corwin Levins, MD   Recommendations at discharge:   Follow up with PCP for kidney function testing Follow up with wound care clinic  Discharge Diagnoses: Principal Problem:   AKI (acute kidney injury) (HCC)  Resolved Problems:   * No resolved hospital problems. *  Hospital Course: Bethany Lopez is 73 y.o. female with hypertension, hyperlipidemia, morbid obesity, chronic lymphedema, asthma/COPD, chronic iron deficiency anemia, chronic thrombocytopenia, who presented with generalized weakness, poor appetite, dark-colored urine, and frequent falls.  Patient endorsed that her symptoms began 2 days prior accompanied by intermittent diarrhea.  Denies any nausea or vomiting.  In the ED she was found to be tachycardic, blood pressure 108/63.  Trauma scans for CT head and neck were negative for fracture.  Physical exam lower extremity wounds.  Blood work revealed creatinine of 3.8, hyperkalemia 5.4.  Patient received IV fluid bolus, vancomycin, ceftriaxone.  She was admitted and started on IV fluids.  Creatinine gradually down trended towards her baseline.  Renal ultrasound was within normal limits.  Renal injury likely secondary to dehydration given her gastroenteritis prior to her arrival, she had no further bowel movements while admitted. Patient's losartan was held and she was initiated on amlodipine.  We have also recommended discontinuing all NSAIDs including Mobic which patient is prescribed for chronic pain. Patient's lower extremity wounds did not appear acutely infected and she did not require any further antibiotics.  She will follow-up with the wound care clinic, referral was placed at discharge. Patient worked with physical therapy and home health DME and home health PT was arranged prior  to DC   AKI - Likely prerenal in setting of dehydration - Baseline creatinine 0.95, creatinine 3.82 on arrival. - Creatinine has downtrended near baseline.  Will need close outpatient follow-up for repeat creatinine. - Encouraged p.o. intake - Renal ultrasound: Within normal limits. - CK is mildly elevated   Diarrhea - Now resolved.  No further episodes since admitted.  Suspect viral gastroenteritis.   Rash under breast folds - Consistent with intertrigo -- Nystatin power - Continue moisture wicking, keep dry. - Wound care consulted   Tachycardia -Resolved now - Dopplers negative for DVT -- Flu/RSV/COVID negative -- Chest CT negative on arrival for PE -- EKG: Sinus tach, occasional PVCs   Chronic lymphedema ulcer and bilateral shin areas - Worse on left than right, no signs of active infection.   - Wound care team consulted need close outpatient follow-up with wound clinic - Patient received ceftriaxone and vancomycin in the ED, but given no concerns of active infection these have been discontinued   Normocytic anemia -Stable.  Thrombocytopenia - Chronic.  Stable.  Hypertension -On ARB outpatient.  Hold for now given AKI.  Proceed with amlodipine for now.    Hyperlipidemia - Continue statin   Asthma/COPD - Not in exacerbation at this time - As needed inhalers   Morbid obesity BMI 39 - Outpatient follow up for lifestyle modification and risk factor management  Diet recommendation:  Discharge Diet Orders (From admission, onward)     Start     Ordered   07/21/23 0000  Diet general        07/21/23 1334         Discharge Instructions     Ambulatory referral to Wound Clinic   Complete  by: As directed    Call MD for:  difficulty breathing, headache or visual disturbances   Complete by: As directed    Call MD for:  persistant dizziness or light-headedness   Complete by: As directed    Call MD for:  persistant nausea and vomiting   Complete by: As  directed    Call MD for:  severe uncontrolled pain   Complete by: As directed    Call MD for:  temperature >100.4   Complete by: As directed    Diet general   Complete by: As directed    Discharge instructions   Complete by: As directed    You were admitted for kidney injury. Your kidneys are doing much better now. Please follow-up with your primary care physician in 1 week to recheck your kidney function.  We have changed your blood pressure medication from losartan to amlodipine. You may be able to go back on losartan at a later time once your kidney function has returned to normal.  Please follow-up with your primary care doctor to discuss this. It appears that you were taking ibuprofen and meloxicam.  Both of these medications are hard on your kidneys.  Please stop taking them.  If you have pain you may proceed with Tylenol/acetaminophen event as needed   Increase activity slowly   Complete by: As directed    No wound care   Complete by: As directed          DISCHARGE MEDICATION: Allergies as of 07/21/2023       Reactions   Lodine [etodolac] Other (See Comments)   GI upset   Penicillins         Medication List     PAUSE taking these medications    losartan 50 MG tablet Wait to take this until your doctor or other care provider tells you to start again. Commonly known as: COZAAR Take 2 tablets by mouth once daily       STOP taking these medications    diclofenac sodium 1 % Gel Commonly known as: VOLTAREN   ibuprofen 800 MG tablet Commonly known as: ADVIL   meloxicam 15 MG tablet Commonly known as: MOBIC       TAKE these medications    acetaminophen 500 MG tablet Commonly known as: TYLENOL Take 1-2 tablets by mouth every 6 (six) hours as needed.   albuterol 108 (90 Base) MCG/ACT inhaler Commonly known as: VENTOLIN HFA Inhale 2 puffs into the lungs every 6 (six) hours as needed for wheezing or shortness of breath.   ALPRAZolam 0.25 MG  tablet Commonly known as: XANAX Take 0.25 mg by mouth 2 (two) times daily as needed.   amLODipine 5 MG tablet Commonly known as: NORVASC Take 1 tablet (5 mg total) by mouth daily. Start taking on: July 22, 2023   aspirin EC 81 MG tablet Take by mouth.   atorvastatin 20 MG tablet Commonly known as: LIPITOR Take 1 tablet by mouth once daily   cetirizine 10 MG tablet Commonly known as: ZYRTEC Take 1 tablet (10 mg total) by mouth daily. What changed:  when to take this reasons to take this   Fish Oil 1000 MG Caps Take 1 capsule by mouth daily.   Flovent HFA 110 MCG/ACT inhaler Generic drug: fluticasone Inhale 2 puffs into the lungs 2 (two) times daily. What changed:  when to take this reasons to take this   fluticasone 50 MCG/ACT nasal spray Commonly known as: FLONASE Place 2 sprays into  both nostrils daily. What changed:  when to take this reasons to take this   multivitamin tablet Take 1 tablet by mouth daily.   Nasacort Allergy 24HR 55 MCG/ACT Aero nasal inhaler Generic drug: triamcinolone Place 2 sprays into the nose daily.   SUPER B COMPLEX PO Take by mouth.               Durable Medical Equipment  (From admission, onward)           Start     Ordered   07/20/23 1356  For home use only DME Bedside commode  Once       Comments: Bariatric  Question:  Patient needs a bedside commode to treat with the following condition  Answer:  Lymphedema   07/20/23 1355   07/20/23 1356  For home use only DME Walker rolling  Once       Comments: Bariatric  Question Answer Comment  Walker: With 5 Inch Wheels   Patient needs a walker to treat with the following condition Lymphedema      07/20/23 1355            Discharge Exam: Filed Weights   07/18/23 0826  Weight: 111.1 kg   General exam: Appears calm and comfortable, NAD  Respiratory system: No work of breathing, symmetric chest wall expansion Cardiovascular system: S1 & S2 heard, RRR.   Gastrointestinal system: Abdomen is nondistended, soft and nontender.  Neuro: Alert and oriented. No focal neurological deficits. Extremities: Lower extremities are bandaged, no oozing, bleeding or soaking of the bandage.  No surrounding erythema, legs are bilaterally tender to minimal palpation. Psychiatry: Demonstrates appropriate judgement and insight. Mood & affect appropriate for situation.   Condition at discharge: good  The results of significant diagnostics from this hospitalization (including imaging, microbiology, ancillary and laboratory) are listed below for reference.   Imaging Studies: US Venous Img Lower Bilateral (DVT) Result Date: 07/20/2023 CLINICAL DATA:  Tachycardia.  Pain and edema. EXAM: Bilateral lower Extremity Venous Doppler Ultrasound TECHNIQUE: Gray-scale sonography with compression, as well as color and duplex ultrasound, were performed to evaluate the deep venous system(s) from the level of the common femoral vein through the popliteal and proximal calf veins. COMPARISON:  None available FINDINGS: VENOUS Normal compressibility of the common femoral, superficial femoral, and popliteal veins, as well as the visualized calf veins. Visualized portions of profunda femoral vein and great saphenous vein unremarkable. No filling defects to suggest DVT on grayscale or color Doppler imaging. Doppler waveforms show normal direction of venous flow, normal respiratory plasticity and response to augmentation. OTHER None. Limitations: none IMPRESSION: No  lower extremity DVT. Electronically Signed   By: Acquanetta Belling M.D.   On: 07/20/2023 17:00   US RENAL Result Date: 07/18/2023 CLINICAL DATA:  AKI. EXAM: RENAL / URINARY TRACT ULTRASOUND COMPLETE COMPARISON:  None Available. FINDINGS: Right Kidney: Renal measurements: 9.0 x 4.9 x 3.9 cm = volume: 91 mL. Echogenicity within normal limits. No gross abnormality. Left Kidney: Renal measurements: 8.7 x 5.2 x 5.3 cm = volume: 124 mL.  Echogenicity within normal limits. No gross abnormality. Bladder: Appears normal for degree of bladder distention. Other: None. IMPRESSION: No acute sonographic abnormality. Electronically Signed   By: Hart Robinsons M.D.   On: 07/18/2023 15:40   CT HEAD WO CONTRAST ( ) Result Date: 07/18/2023 CLINICAL DATA:  Trauma, fall at of bed, hematoma above left eye. EXAM: CT HEAD WITHOUT CONTRAST CT CERVICAL SPINE WITHOUT CONTRAST TECHNIQUE: Multidetector CT imaging of the head  and cervical spine was performed following the standard protocol without intravenous contrast. Multiplanar CT image reconstructions of the cervical spine were also generated. RADIATION DOSE REDUCTION: This exam was performed according to the departmental dose-optimization program which includes automated exposure control, adjustment of the mA and/or kV according to patient size and/or use of iterative reconstruction technique. COMPARISON:  None Available. FINDINGS: CT HEAD FINDINGS Brain: No acute intracranial hemorrhage. No CT evidence of acute infarct. Nonspecific hypoattenuation in the periventricular and subcortical white matter favored to reflect chronic microvascular ischemic changes. 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 No edema, mass effect, or midline shift. The basilar cisterns are patent. Ventricles: Prominence of the ventricles suggesting underlying parenchymal volume loss. Vascular: Atherosclerotic calcifications of the carotid siphons. No hyperdense vessel. Skull: No acute or aggressive finding. Orbits: Visualized orbits are symmetric. Sinuses: The visualized paranasal sinuses are clear. Other: Left frontal scalp hematoma with soft tissue swelling extending inferiorly over the supraorbital ridge and within the preseptal soft tissues along the superolateral aspect of the left orbit. Mastoid air cells are clear. CT CERVICAL SPINE FINDINGS Alignment: Reversal of the normal  cervical lordosis. Trace anterolisthesis of C3 on C4. No facet subluxation or dislocation. Skull base and vertebrae: No compression fracture or displaced fracture in the cervical spine. No suspicious osseous lesion. Soft tissues and spinal canal: No prevertebral fluid or swelling. No visible canal hematoma. Disc levels: Severe disc space narrowing at C3-4, C4-5, and C5-6. Disc osteophyte complexes at multiple levels. No high-grade osseous spinal canal stenosis. Facet arthrosis throughout the cervical spine. Foraminal narrowing is most pronounced at C4-5. Upper chest: Emphysema in the lung apices. Other: None. IMPRESSION: No CT evidence of acute intracranial abnormality. Left frontal scalp hematoma. Partial extension of soft tissue swelling into the preseptal soft tissues of the left orbit. No acute fracture or traumatic malalignment of the cervical spine. Degenerative changes as above. Emphysema in the lung apices. Electronically Signed   By: Emily Filbert M.D.   On: 07/18/2023 10:31   CT Cervical Spine Wo Contrast Result Date: 07/18/2023 CLINICAL DATA:  Trauma, fall at of bed, hematoma above left eye. EXAM: CT HEAD WITHOUT CONTRAST CT CERVICAL SPINE WITHOUT CONTRAST TECHNIQUE: Multidetector CT imaging of the head and cervical spine was performed following the standard protocol without intravenous contrast. Multiplanar CT image reconstructions of the cervical spine were also generated. RADIATION DOSE REDUCTION: This exam was performed according to the departmental dose-optimization program which includes automated exposure control, adjustment of the mA and/or kV according to patient size and/or use of iterative reconstruction technique. COMPARISON:  None Available. FINDINGS: CT HEAD FINDINGS Brain: No acute intracranial hemorrhage. No CT evidence of acute infarct. Nonspecific hypoattenuation in the periventricular and subcortical white matter favored to reflect chronic microvascular ischemic changes.  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 No edema, mass effect, or midline shift. The basilar cisterns are patent. Ventricles: Prominence of the ventricles suggesting underlying parenchymal volume loss. Vascular: Atherosclerotic calcifications of the carotid siphons. No hyperdense vessel. Skull: No acute or aggressive finding. Orbits: Visualized orbits are symmetric. Sinuses: The visualized paranasal sinuses are clear. Other: Left frontal scalp hematoma with soft tissue swelling extending inferiorly over the supraorbital ridge and within the preseptal soft tissues along the superolateral aspect of the left orbit. Mastoid air cells are clear. CT CERVICAL SPINE FINDINGS Alignment: Reversal of the normal cervical lordosis. Trace anterolisthesis of C3 on C4. No facet subluxation or dislocation.  Skull base and vertebrae: No compression fracture or displaced fracture in the cervical spine. No suspicious osseous lesion. Soft tissues and spinal canal: No prevertebral fluid or swelling. No visible canal hematoma. Disc levels: Severe disc space narrowing at C3-4, C4-5, and C5-6. Disc osteophyte complexes at multiple levels. No high-grade osseous spinal canal stenosis. Facet arthrosis throughout the cervical spine. Foraminal narrowing is most pronounced at C4-5. Upper chest: Emphysema in the lung apices. Other: None. IMPRESSION: No CT evidence of acute intracranial abnormality. Left frontal scalp hematoma. Partial extension of soft tissue swelling into the preseptal soft tissues of the left orbit. No acute fracture or traumatic malalignment of the cervical spine. Degenerative changes as above. Emphysema in the lung apices. Electronically Signed   By: Emily Filbert M.D.   On: 07/18/2023 10:31   DG Tibia/Fibula Left Result Date: 07/18/2023 CLINICAL DATA:  Anterior purulent lesion over the shin. EXAM: LEFT TIBIA AND FIBULA - 2 VIEW COMPARISON:  None Available. FINDINGS:  Moderate degenerative changes are present in the medial compartment of the knee. Small joint effusion may be present at the knee. No acute fractures or focal osseous lesions are present. Edematous changes and possible fluid collection are noted just distal to the lateral malleolus. Underlying infection is not excluded. IMPRESSION: 1. Edematous changes and possible fluid collection just distal to the lateral malleolus. Underlying infection is not excluded. 2. Moderate degenerative changes in the medial compartment of the knee. Electronically Signed   By: Marin Roberts M.D.   On: 07/18/2023 09:52   DG Foot Complete Right Result Date: 07/18/2023 CLINICAL DATA:  Slid out of bed. Serial and lesion lateral to the lateral malleolus. EXAM: RIGHT FOOT COMPLETE - 3 VIEW COMPARISON:  None Available. FINDINGS: Focal soft tissue swelling and ulceration is evident lateral to the right ankle. No acute or healing bone lesion is present. A plantar calcaneal spur is present. No radiopaque foreign body is present. The foot is otherwise unremarkable. IMPRESSION: 1. Focal soft tissue swelling and ulceration lateral to the right ankle. 2. No acute or healing bone lesion. 3. Plantar calcaneal spur. Electronically Signed   By: Marin Roberts M.D.   On: 07/18/2023 09:50   DG Chest Portable 1 View Result Date: 07/18/2023 CLINICAL DATA:  Slid out of bed. EXAM: PORTABLE CHEST 1 VIEW COMPARISON:  None Available. FINDINGS: The heart size and mediastinal contours are within normal limits. Both lungs are clear. The visualized skeletal structures are unremarkable. IMPRESSION: No active disease. Electronically Signed   By: Marin Roberts M.D.   On: 07/18/2023 09:47    Microbiology: Results for orders placed or performed during the hospital encounter of 07/18/23  Blood culture (routine x 2)     Status: None (Preliminary result)   Collection Time: 07/18/23  9:55 AM   Specimen: BLOOD  Result Value Ref Range Status    Specimen Description BLOOD RIGHT FA  Final   Special Requests   Final    BOTTLES DRAWN AEROBIC AND ANAEROBIC Blood Culture results may not be optimal due to an inadequate volume of blood received in culture bottles   Culture   Final    NO GROWTH 3 DAYS Performed at [facility], [address] [place], [place] [address]    Report Status PENDING  Incomplete  Blood culture (routine x 2)     Status: None (Preliminary result)   Collection Time: 07/18/23  9:56 AM   Specimen: BLOOD  Result Value Ref Range Status   Specimen Description BLOOD RIGHT  [facility]  Final   Special Requests   Final    BOTTLES DRAWN AEROBIC AND ANAEROBIC Blood Culture results may not be optimal due to an inadequate volume of blood received in culture bottles   Culture   Final    NO GROWTH 3 DAYS Performed at [facility], [address], [place], [place] [address]    Report Status PENDING  Incomplete  Resp panel by RT-PCR (RSV, Flu A&B, Covid) Anterior Nasal Swab     Status: None   Collection Time: 07/19/23  9:14 AM   Specimen: Anterior Nasal Swab  Result Value Ref Range Status   SARS Coronavirus 2 by RT PCR NEGATIVE NEGATIVE Final    Comment: (NOTE) SARS-CoV-2 target nucleic acids are NOT DETECTED.  The SARS-CoV-2 RNA is generally detectable in upper respiratory specimens during the acute phase of infection. The lowest concentration of SARS-CoV-2 viral copies this assay can detect is 138 copies/mL. A negative result does not preclude SARS-Cov-2 infection and should not be used as the sole basis for treatment or other patient management decisions. A negative result may occur with  improper specimen collection/handling, submission of specimen other than nasopharyngeal swab, presence of viral mutation(s) within the areas targeted by this assay, and inadequate number of viral copies(<138 copies/mL). A negative result must be combined with clinical observations, patient history, and  epidemiological information. The expected result is Negative.  Fact Sheet for Patients:  BloggerCourse.com  Fact Sheet for Healthcare Providers:  SeriousBroker.it  This test is no t yet approved or cleared by the [place] FDA and  has been authorized for detection and/or diagnosis of SARS-CoV-2 by FDA under an Emergency Use Authorization (EUA). This EUA will remain  in effect (meaning this test can be used) for the duration of the COVID-19 declaration under Section 564(b)(1) of the Act, 21 U.S.C.section 360bbb-3(b)(1), unless the authorization is terminated  or revoked sooner.       Influenza A by PCR NEGATIVE NEGATIVE Final   Influenza B by PCR NEGATIVE NEGATIVE Final    Comment: (NOTE) The Xpert Xpress SARS-CoV-2/FLU/RSV plus assay is intended as an aid in the diagnosis of influenza from Nasopharyngeal swab specimens and should not be used as a sole basis for treatment. Nasal washings and aspirates are unacceptable for Xpert Xpress SARS-CoV-2/FLU/RSV testing.  Fact Sheet for Patients: BloggerCourse.com  Fact Sheet for Healthcare Providers: SeriousBroker.it  This test is not yet approved or cleared by the [place] FDA and has been authorized for detection and/or diagnosis of SARS-CoV-2 by FDA under an Emergency Use Authorization (EUA). This EUA will remain in effect (meaning this test can be used) for the duration of the COVID-19 declaration under Section 564(b)(1) of the Act, 21 U.S.C. section 360bbb-3(b)(1), unless the authorization is terminated or revoked.     Resp Syncytial Virus by PCR NEGATIVE NEGATIVE Final    Comment: (NOTE) Fact Sheet for Patients: BloggerCourse.com  Fact Sheet for Healthcare Providers: SeriousBroker.it  This test is not yet approved or cleared by the [place] FDA and has been  authorized for detection and/or diagnosis of SARS-CoV-2 by FDA under an Emergency Use Authorization (EUA). This EUA will remain in effect (meaning this test can be used) for the duration of the COVID-19 declaration under Section 564(b)(1) of the Act, 21 U.S.C. section 360bbb-3(b)(1), unless the authorization is terminated or revoked.  Performed at [facility], [address] [place], [place] [address]     Labs: CBC: Recent Labs  Lab 07/18/23 [phone]  07/19/23 0429 07/20/23 0504 07/21/23 0503  WBC 9.2 8.1 6.0 6.2  NEUTROABS 7.8*  --  3.7 3.5  HGB 11.3* 9.9* 9.8* 10.0*  HCT 34.9* 32.1* 29.6* 31.3*  MCV 79.1* 83.8 78.1* 80.5  PLT 167 161 159 162   Basic Metabolic Panel: Recent Labs  Lab 07/18/23 0955 07/19/23 0429 07/20/23 0504 07/21/23 0503  NA 131* 137 141 142  K 5.4* 5.2* 4.6 4.3  CL 103 109 114* 113*  CO2 16* 16* 20* 21*  GLUCOSE 96 74 83 94  BUN 104* 87* 55* 38*  CREATININE 3.82* 2.26* 1.29* 1.08*  CALCIUM 9.1 8.9 8.7* 8.9  MG  --   --  1.8 1.5*  PHOS  --   --  3.0 2.6   Liver Function Tests: Recent Labs  Lab 07/20/23 0504 07/21/23 0503  AST 39 32  ALT 38 35  ALKPHOS 82 87  BILITOT 0.4 0.7  PROT 6.4* 6.4*  ALBUMIN 2.2* 2.3*   CBG: No results for input(s): "GLUCAP" in the last 168 hours.  Discharge time spent: 31 minutes.  Signed: Debarah Crape, DO [facility] 07/21/2023

## 2023-07-23 ENCOUNTER — Telehealth: Payer: Self-pay | Admitting: *Deleted

## 2023-07-23 LAB — CULTURE, BLOOD (ROUTINE X 2)
Culture: NO GROWTH
Culture: NO GROWTH

## 2023-07-23 NOTE — Transitions of Care (Post Inpatient/ED Visit) (Signed)
 07/23/2023  Name: Bethany Lopez MRN: [id number] DOB: 1951/02/25  Today's TOC FU Call Status: Today's TOC FU Call Status:: Successful TOC FU Call Completed TOC FU Call Complete Date: 07/23/23 Patient's Name and Date of Birth confirmed.  Transition Care Management Follow-up Telephone Call Date of Discharge: 07/21/23 Discharge Facility: [facility]) Type of Discharge: Inpatient Admission Primary Inpatient Discharge Diagnosis:: AKI secondary to dehydration How have you been since you were released from the hospital?: Better ("I am okay I guess.  You are getting on my nerves, asking me all these questions.  You do what I tell you to- and you get back with me about all of this.  This should have all been done before I left the hospital") Any questions or concerns?: Yes Patient Questions/Concerns:: Did not receive DME for rolling walker and BSC; has not yet heard from [facility]: Care Coordination outreaches placed to [employer] and to [facility] Patient Questions/Concerns Addressed: Other:  Patient confrontational throughout [facility] call today: she is blatantly irritated by call/ initiation of TOC- she tells me that my questions do not matter to her, and she wants me to listen to her and not to talk.  She tells me that she has not received BSC nor rolling walker from hospital discharge and she has not heard from [facility]; she demands that I "figure that out" and get back with her "when you do;"  I was able to complete medication review, although patient was confrontational and verbally abusive, with verbal arguing behaviors throughout all aspects of TOC call today, despite multiple attempts to try to engage patient in purpose of TOC call  Care Coordination outreaches x 3: -- scheduling care guide to facilitate PCP hospital follow up office visit at exactly the time/ day patient insists on -- [facility]: spoke with [facility]: she confirms receipt of home health referral and  reported that they have reached out to PCP office to obtain additional orders for skilled nursing- she reports they will contact patient "probably tomorrow" to schedule initial home visits -- [employer] [facility]: required 2 lengthy calls, to [place] branch and to [facility] branch: eventually spoke with "Osvaldo Angst" [facility] [phone]) who tells me she has contacted representative Jon who accepted initial referral while patient was hospitalizes as inpatient:  she advises me to tell patient she will contact her with an update and to schedule time for delivery of equipment  Re-contacted patient back and updated her on all of above: attempted to give her the correct number for the [employer] D[employer] company in [place]: unsure if patient took number  Items Reviewed: Did you receive and understand the discharge instructions provided?: No (Patient is adamant that she did not receive discharge summary at timje of hospital discharge: attempted to review with patient during [facility] call, however, patient is verbally confrontational throughout [facility] call- difficult to complete) Medications obtained,verified, and reconciled?: Yes (Medications Reviewed) (Full medication reconciliation/ review completed; no concerns or discrepancies identified; confirmed patient obtained/ is taking all newly Rx'd medications as instructed; self-manages medications and denies questions/ concerns around medications today) Any new allergies since your discharge?: No Dietary orders reviewed?: Yes Type of Diet Ordered:: "Regular" Do you have support at home?: Yes People in Home [RPT]: spouse Name of Support/Comfort Primary Source: Reports essentially independent in self-care activities; supportive spouse assists as/ if needed/ indicated  Medications Reviewed Today: Medications Reviewed Today     Reviewed by Michaela Corner, RN (Registered Nurse) on 07/23/23 at 1650  Med List Status: <  None>   Medication Order Taking? Sig Documenting Provider Last  Dose Status Informant  acetaminophen (TYLENOL) 500 MG tablet [id number] Yes Take 1-2 tablets by mouth every 6 (six) hours as needed. [provider] Taking Active Self  albuterol (PROVENTIL HFA;VENTOLIN HFA) 108 (90 Base) MCG/ACT inhaler [id number] Yes Inhale 2 puffs into the lungs every 6 (six) hours as needed for wheezing or shortness of breath. Corwin Levins, MD Taking Active Self  ALPRAZolam Prudy Feeler) 0.25 MG tablet [id number] No Take 0.25 mg by mouth 2 (two) times daily as needed.  Patient not taking: Reported on 07/23/2023   [provider] Not Taking Active Self  amLODipine (NORVASC) 5 MG tablet [id number] Yes Take 1 tablet (5 mg total) by mouth daily. Debarah Crape, DO Taking Active   aspirin 81 MG EC tablet [id number] No Take by mouth.  Patient not taking: Reported on 07/18/2023   [provider] Not Taking Active Self  atorvastatin (LIPITOR) 20 MG tablet [id number] Yes Take 1 tablet by mouth once daily Corwin Levins, MD Taking Active Self  B Complex-C (SUPER B COMPLEX PO) [id number] Yes Take by mouth. [provider] Taking Active Self  cetirizine (ZYRTEC) 10 MG tablet [id number]  Take 1 tablet (10 mg total) by mouth daily.  Patient taking differently: Take 10 mg by mouth daily as needed for allergies.   Corwin Levins, MD  Expired 07/18/23 2359 Self  FLOVENT HFA 110 MCG/ACT inhaler [id number] Yes Inhale 2 puffs into the lungs 2 (two) times daily.  Patient taking differently: Inhale 2 puffs into the lungs 2 (two) times daily as needed.   Corwin Levins, MD Taking Active Self  fluticasone [facility]) 50 MCG/ACT nasal spray [id number] Yes Place 2 sprays into both nostrils daily.  Patient taking differently: Place 2 sprays into both nostrils daily as needed for allergies or rhinitis.   Corwin Levins, MD Taking Active Self  losartan (COZAAR) 50 MG tablet [id number] No Take 2 tablets by mouth once daily  Patient not taking: Reported on 07/23/2023   Corwin Levins, MD Not  Taking Active Self  Multiple Vitamin (MULTIVITAMIN) tablet [id number] Yes Take 1 tablet by mouth daily. [provider] Taking Active Self  Omega-3 Fatty Acids (FISH OIL) 1000 MG CAPS [id number] Yes Take 1 capsule by mouth daily. [provider] Taking Active Self  triamcinolone (NASACORT ALLERGY 24HR) 55 MCG/ACT AERO nasal inhaler [id number] Yes Place 2 sprays into the nose daily. [provider] Taking Active Self           Home Care and Equipment/Supplies: Were Home Health Services Ordered?: Yes Name of Home Health Agency:: [facility] [phone] Has Agency set up a time to come to your home?: No EMR reviewed for Home Health Orders: Orders present/patient has not received call (refer to CM for follow-up) (Care Coordination outreach completed in real time with "Jasmine;" she reports they are awaiting finalization of referral by PCP for skilled nursing: they have reached out to PCP and will be contacting patient tomorrow to initiate scheduling disciplines) Any new equipment or medical supplies ordered?: Yes (BSC and Rolling walker) Name of Medical supply agency?: [employer]:  [place] Office:  [phone] Were you able to get the equipment/medical supplies?: No (care coordination outreach to [employer]- [place] branch [phone]: spoke with "Osvaldo Angst;" she reports she will contact [facility] who accepted orders, and will outreach patient with follow up on order status) Do you have any questions related to the use of the equipment/supplies?: No (reports she already  has and is using one walker she paid out of pocket for)  Functional Questionnaire: Do you need assistance with bathing/showering or dressing?: Yes (husband supervises/ assists as indicated) Do you need assistance with meal preparation?: Yes (husband supervises/ assists as indicated) Do you need assistance with eating?: No Do you have difficulty maintaining continence: No Do you need assistance with getting out of  bed/getting out of a chair/moving?: No Do you have difficulty managing or taking your medications?: No  Follow up appointments reviewed: PCP Follow-up appointment confirmed?: Yes (care coordination outreach in real-time with scheduling care guide to successfully schedule hospital follow up PCP appointment 07/26/23) Date of PCP follow-up appointment?: 07/26/23 Follow-up Provider: PCP- Dr. Jonny Ruiz Specialist [facility] Follow-up appointment confirmed?: No Reason Specialist Follow-Up Not Confirmed: Patient has Specialist Provider Number and will Call for Appointment Do you need transportation to your follow-up appointment?: No Do you understand care options if your condition(s) worsen?: Yes-patient verbalized understanding  SDOH Interventions Today    Flowsheet Row Most Recent Value  SDOH Interventions   Food Insecurity Interventions Intervention Not Indicated  Housing Interventions Intervention Not Indicated  Transportation Interventions Intervention Not Indicated  [husband provides transportation]  Utilities Interventions Intervention Not Indicated      Total time spent from review to signing of note/ including any care coordination interventions:  98 minutes  Pls call/ message for questions,  Caryl Pina, RN, BSN, CCRN Alumnus RN Care Manager  Transitions of Care  [facility] - [facility] [phone]: direct office

## 2023-07-24 ENCOUNTER — Telehealth: Payer: Self-pay | Admitting: Internal Medicine

## 2023-07-24 NOTE — Telephone Encounter (Signed)
 Called and gave verbals.

## 2023-07-24 NOTE — Telephone Encounter (Unsigned)
 Copied from CRM [id number]. Topic: Clinical - Home Health Verbal Orders >> Jul 23, 2023  3:17 PM Carlatta H wrote: Caller/Agency: Lucilla L[facility] Number: [phone] Service Requested: Skilled Nursing Frequency: Will call for further orders//request to open Any new concerns about the patient? No

## 2023-07-24 NOTE — Telephone Encounter (Signed)
Ok for verbal if that is ok 

## 2023-07-25 ENCOUNTER — Telehealth: Payer: Self-pay | Admitting: Internal Medicine

## 2023-07-25 DIAGNOSIS — I10 Essential (primary) hypertension: Secondary | ICD-10-CM | POA: Diagnosis not present

## 2023-07-25 DIAGNOSIS — I890 Lymphedema, not elsewhere classified: Secondary | ICD-10-CM | POA: Diagnosis not present

## 2023-07-25 DIAGNOSIS — L03116 Cellulitis of left lower limb: Secondary | ICD-10-CM | POA: Diagnosis not present

## 2023-07-25 DIAGNOSIS — L97811 Non-pressure chronic ulcer of other part of right lower leg limited to breakdown of skin: Secondary | ICD-10-CM | POA: Diagnosis not present

## 2023-07-25 DIAGNOSIS — L03115 Cellulitis of right lower limb: Secondary | ICD-10-CM | POA: Diagnosis not present

## 2023-07-25 DIAGNOSIS — I872 Venous insufficiency (chronic) (peripheral): Secondary | ICD-10-CM | POA: Diagnosis not present

## 2023-07-25 DIAGNOSIS — L97821 Non-pressure chronic ulcer of other part of left lower leg limited to breakdown of skin: Secondary | ICD-10-CM | POA: Diagnosis not present

## 2023-07-25 DIAGNOSIS — J4489 Other specified chronic obstructive pulmonary disease: Secondary | ICD-10-CM | POA: Diagnosis not present

## 2023-07-25 DIAGNOSIS — N179 Acute kidney failure, unspecified: Secondary | ICD-10-CM | POA: Diagnosis not present

## 2023-07-25 NOTE — Telephone Encounter (Unsigned)
 Copied from CRM [id number]. Topic: Clinical - Home Health Verbal Orders >> Jul 25, 2023  2:54 PM Truddie Crumble wrote: Caller/Agency: laurie from [facility] Callback Number: [phone] (can leave a voicemail) Service Requested: wound care (patient is already getting physical and occupational therapy also home health aid) Frequency: two week five and one week three Any new concerns about the patient? No

## 2023-07-26 ENCOUNTER — Encounter: Payer: Self-pay | Admitting: Internal Medicine

## 2023-07-26 ENCOUNTER — Ambulatory Visit: Admitting: Internal Medicine

## 2023-07-26 VITALS — BP 132/84 | HR 102 | Temp 98.0°F | Resp 18 | Ht 65.0 in | Wt 268.4 lb

## 2023-07-26 DIAGNOSIS — E7800 Pure hypercholesterolemia, unspecified: Secondary | ICD-10-CM | POA: Diagnosis not present

## 2023-07-26 DIAGNOSIS — R739 Hyperglycemia, unspecified: Secondary | ICD-10-CM

## 2023-07-26 DIAGNOSIS — J309 Allergic rhinitis, unspecified: Secondary | ICD-10-CM | POA: Diagnosis not present

## 2023-07-26 DIAGNOSIS — N179 Acute kidney failure, unspecified: Secondary | ICD-10-CM

## 2023-07-26 DIAGNOSIS — I10 Essential (primary) hypertension: Secondary | ICD-10-CM | POA: Diagnosis not present

## 2023-07-26 DIAGNOSIS — Z720 Tobacco use: Secondary | ICD-10-CM | POA: Diagnosis not present

## 2023-07-26 DIAGNOSIS — R790 Abnormal level of blood mineral: Secondary | ICD-10-CM | POA: Diagnosis not present

## 2023-07-26 LAB — BASIC METABOLIC PANEL WITH GFR
BUN: 21 mg/dL (ref 6–23)
CO2: 23 meq/L (ref 19–32)
Calcium: 9.1 mg/dL (ref 8.4–10.5)
Chloride: 104 meq/L (ref 96–112)
Creatinine, Ser: 1.24 mg/dL — ABNORMAL HIGH (ref 0.40–1.20)
GFR: 42.85 mL/min — ABNORMAL LOW (ref >60.00)
Glucose, Bld: 90 mg/dL (ref 70–99)
Potassium: 4.6 meq/L (ref 3.5–5.1)
Sodium: 137 meq/L (ref 135–145)

## 2023-07-26 LAB — MAGNESIUM: Magnesium: 1.5 mg/dL (ref 1.5–2.5)

## 2023-07-26 LAB — HEMOGLOBIN A1C: Hgb A1c MFr Bld: 6.9 % — ABNORMAL HIGH (ref 4.6–6.5)

## 2023-07-26 MED ORDER — LOSARTAN POTASSIUM 50 MG PO TABS: 50.0000 mg | ORAL_TABLET | Freq: Every day | ORAL | 3 refills | Status: AC

## 2023-07-26 MED ORDER — ATORVASTATIN CALCIUM 40 MG PO TABS: 40.0000 mg | ORAL_TABLET | Freq: Every day | ORAL | 3 refills | Status: AC

## 2023-07-26 MED ORDER — METHYLPREDNISOLONE ACETATE 80 MG/ML IJ SUSP
80.0000 mg | Freq: Once | INTRAMUSCULAR | Status: AC
  Administered 2023-07-26: 80 mg via INTRAMUSCULAR

## 2023-07-26 NOTE — Assessment & Plan Note (Signed)
 Pt counsled to quit, pt not ready

## 2023-07-26 NOTE — Assessment & Plan Note (Signed)
 Improved, losartan 100 gm has been held; ok to restart at 50 mg every day only

## 2023-07-26 NOTE — Assessment & Plan Note (Signed)
 Lab Results  Component Value Date   LDLCALC 74 04/24/2023   Mild uncontrolled, for increased lipitor 40 mg qd

## 2023-07-26 NOTE — Assessment & Plan Note (Signed)
Mild to mod, for depomedrol 80 mg IM, to f/u any worsening symptoms or concerns 

## 2023-07-26 NOTE — Patient Instructions (Signed)
 You had the steroid shot today  Ok to increase the lipitor (atorvastatin) to 40 mg per day  Ok to restart the Losartan 50 mg at ONE pill only per day  Please quit smoking  Please continue all other medications as before, and refills have been done if requested.  Please have the pharmacy call with any other refills you may need.  Please keep your appointments with your specialists as you may have planned - Home Health with PT, and wound clinic late may 2025  Please go to the LAB at the blood drawing area for the tests to be done  You will be contacted by phone if any changes need to be made immediately.  Otherwise, you will receive a letter about your results with an explanation, but please check with MyChart first.  Please make an Appointment to return in 3 months, or sooner if needed

## 2023-07-26 NOTE — Assessment & Plan Note (Signed)
 Due to AGE at last hospn with GI symptoms resolved and afeb.  For f/u lab today, including magnesium that was low in jan 2025

## 2023-07-26 NOTE — Telephone Encounter (Signed)
 Ok for wound care verbal order

## 2023-07-26 NOTE — Telephone Encounter (Signed)
 Called and gave verbals.

## 2023-07-26 NOTE — Progress Notes (Signed)
 Patient ID: Bethany Lopez, female   DOB: [birth year], 73 y.o.   MRN: [id number]        Chief Complaint: follow up post hospn apr 2 - 5 2025 with AKI with AGE, and possible wound infection       HPI:  Bethany Lopez is a 73 y.o. female here with husband, overall doing ok after recent hospn with above, tx with IVFs and initially antibiotic IV able to be discontinued. Denies worsening reflux, abd pain, dysphagia, n/v, bowel change or blood.  Pt denies chest pain, increased sob or doe, wheezing, orthopnea, PND, increased LE swelling, palpitations, dizziness or syncope.   Pt denies polydipsia, polyuria, or new focal neuro s/s.    Pt denies fever, wt loss, night sweats, loss of appetite, or other constitutional symptoms  Does have several wks ongoing nasal allergy symptoms with clearish congestion, itch and sneezing, without fever, pain, ST, cough, swelling or wheezing.  Pt is ok with increased lipitor.  Has ongoing leg wounds with wound clinic appt late may 2025.  Has HH with RN and PT to start soon, already has her walker and BSC,        Wt Readings from Last 3 Encounters:  07/26/23 268 lb 6.4 oz (121.7 kg)  07/18/23 245 lb (111.1 kg)  06/26/23 289 lb (131.1 kg)   BP Readings from Last 3 Encounters:  07/26/23 132/84  07/21/23 131/62  04/24/23 124/74         Past Medical History:  Diagnosis Date   Allergic rhinitis, cause unspecified 05/01/2012   Asthma    Carpal tunnel syndrome 06/17/2014   GERD (gastroesophageal reflux disease) 02/11/2015   Hyperlipidemia 05/30/2011   NEC/NOS     Hypertension    Obesity    Thrombocytopenia (HCC) 08/12/2015   Tobacco abuse    Past Surgical History:  Procedure Laterality Date   arthroscopic right knee     5-6 yrs ago   TUBAL LIGATION      reports that she has been smoking cigarettes. She has never used smokeless tobacco. She reports current alcohol use. She reports that she does not use drugs. family history includes Diabetes in her  brother, mother, and sister; Heart disease in her father; Kidney disease in her mother. Allergies  Allergen Reactions   Lodine [Etodolac] Other (See Comments)    GI upset   Penicillins    Current Outpatient Medications on File Prior to Visit  Medication Sig Dispense Refill   acetaminophen (TYLENOL) 500 MG tablet Take 1-2 tablets by mouth every 6 (six) hours as needed.     albuterol (PROVENTIL HFA;VENTOLIN HFA) 108 (90 Base) MCG/ACT inhaler Inhale 2 puffs into the lungs every 6 (six) hours as needed for wheezing or shortness of breath. 1 Inhaler 11   ALPRAZolam (XANAX) 0.25 MG tablet Take 0.25 mg by mouth 2 (two) times daily as needed.     amLODipine (NORVASC) 5 MG tablet Take 1 tablet (5 mg total) by mouth daily. 30 tablet 0   aspirin 81 MG EC tablet Take by mouth.     B Complex-C (SUPER B COMPLEX PO) Take by mouth.     FLOVENT HFA 110 MCG/ACT inhaler Inhale 2 puffs into the lungs 2 (two) times daily. (Patient taking differently: Inhale 2 puffs into the lungs 2 (two) times daily as needed.) 1 each 0   fluticasone (FLONASE) 50 MCG/ACT nasal spray Place 2 sprays into both nostrils daily. (Patient taking differently: Place 2 sprays into both nostrils daily as  needed for allergies or rhinitis.) 45 g 3   Multiple Vitamin (MULTIVITAMIN) tablet Take 1 tablet by mouth daily.     Omega-3 Fatty Acids (FISH OIL) 1000 MG CAPS Take 1 capsule by mouth daily.     triamcinolone (NASACORT ALLERGY 24HR) 55 MCG/ACT AERO nasal inhaler Place 2 sprays into the nose daily.     cetirizine (ZYRTEC) 10 MG tablet Take 1 tablet (10 mg total) by mouth daily. (Patient taking differently: Take 10 mg by mouth daily as needed for allergies.) 90 tablet 3   No current facility-administered medications on file prior to visit.        ROS:  All others reviewed and negative.  Objective        PE:  BP 132/84 (BP Location: Left Arm, Patient Position: Sitting, Cuff Size: Large)   Pulse (!) 102   Temp 98 F (36.7 C) (Temporal)    Resp 18   Ht 5\' 5"  (1.651 m)   Wt 268 lb 6.4 oz (121.7 kg)   SpO2 98%   BMI 44.66 kg/m                 Constitutional: Pt appears in NAD but fatigued               HENT: Head: NCAT.                Right Ear: External ear normal.                 Left Ear: External ear normal.                Eyes: . Pupils are equal, round, and reactive to light. Conjunctivae and EOM are normal               Nose: without d/c or deformity               Neck: Neck supple. Gross normal ROM               Cardiovascular: Normal rate and regular rhythm.                 Pulmonary/Chest: Effort normal and breath sounds without rales or wheezing.                Abd:  Soft, NT, ND, + BS, no organomegaly               Neurological: Pt is alert. At baseline orientation, motor grossly intact               Skin: Skin is warm. No rashes, no other new lesions, LE edema - none               Psychiatric: Pt behavior is normal without agitation   Micro: none  Cardiac tracings I have personally interpreted today:  none  Pertinent Radiological findings (summarize): none   Lab Results  Component Value Date   WBC 6.2 07/21/2023   HGB 10.0 (L) 07/21/2023   HCT 31.3 (L) 07/21/2023   PLT 162 07/21/2023   GLUCOSE 94 07/21/2023   CHOL 149 04/24/2023   TRIG 128.0 04/24/2023   HDL 48.60 04/24/2023   LDLDIRECT 74.0 06/13/2018   LDLCALC 74 04/24/2023   ALT 35 07/21/2023   AST 32 07/21/2023   NA 142 07/21/2023   K 4.3 07/21/2023   CL 113 (H) 07/21/2023   CREATININE 1.08 (H) 07/21/2023   BUN 38 (H) 07/21/2023   CO2 21 (L) 07/21/2023  TSH 2.98 04/24/2023   INR 1.1 11/12/2012   HGBA1C 5.9 04/24/2023   Assessment/Plan:  Shylyn Younce is a 73 y.o. Black or African American [2] female with  has a past medical history of Allergic rhinitis, cause unspecified (05/01/2012), Asthma, Carpal tunnel syndrome (06/17/2014), GERD (gastroesophageal reflux disease) (02/11/2015), Hyperlipidemia (05/30/2011), Hypertension,  Obesity, Thrombocytopenia (HCC) (08/12/2015), and Tobacco abuse.  AKI (acute kidney injury) (HCC) Due to AGE at last hospn with GI symptoms resolved and afeb.  For f/u lab today, including magnesium that was low in jan 2025  Allergic rhinitis Mild to mod, for depomedrol 80 mg IM,  to f/u any worsening symptoms or concerns   Tobacco use Pt counsled to quit, pt not ready  Hyperlipidemia Lab Results  Component Value Date   LDLCALC 74 04/24/2023   Mild uncontrolled, for increased lipitor 40 mg qd   Hypertension Improved, losartan 100 gm has been held; ok to restart at 50 mg every day only  Followup: Return in about 3 months (around 10/25/2023).  Oliver Barre, MD 07/26/2023 2:25 PM [facility] [place] [facility]

## 2023-07-27 ENCOUNTER — Telehealth: Payer: Self-pay | Admitting: *Deleted

## 2023-07-27 DIAGNOSIS — I890 Lymphedema, not elsewhere classified: Secondary | ICD-10-CM | POA: Diagnosis not present

## 2023-07-27 DIAGNOSIS — J4489 Other specified chronic obstructive pulmonary disease: Secondary | ICD-10-CM | POA: Diagnosis not present

## 2023-07-27 DIAGNOSIS — L03116 Cellulitis of left lower limb: Secondary | ICD-10-CM | POA: Diagnosis not present

## 2023-07-27 DIAGNOSIS — L03115 Cellulitis of right lower limb: Secondary | ICD-10-CM | POA: Diagnosis not present

## 2023-07-27 DIAGNOSIS — W19XXXA Unspecified fall, initial encounter: Secondary | ICD-10-CM

## 2023-07-27 DIAGNOSIS — I10 Essential (primary) hypertension: Secondary | ICD-10-CM | POA: Diagnosis not present

## 2023-07-27 DIAGNOSIS — L97811 Non-pressure chronic ulcer of other part of right lower leg limited to breakdown of skin: Secondary | ICD-10-CM | POA: Diagnosis not present

## 2023-07-27 DIAGNOSIS — N179 Acute kidney failure, unspecified: Secondary | ICD-10-CM

## 2023-07-27 DIAGNOSIS — L02416 Cutaneous abscess of left lower limb: Secondary | ICD-10-CM

## 2023-07-27 DIAGNOSIS — L97821 Non-pressure chronic ulcer of other part of left lower leg limited to breakdown of skin: Secondary | ICD-10-CM | POA: Diagnosis not present

## 2023-07-27 DIAGNOSIS — I872 Venous insufficiency (chronic) (peripheral): Secondary | ICD-10-CM | POA: Diagnosis not present

## 2023-07-27 NOTE — Progress Notes (Signed)
 Complex Care Management Note  Care Guide Note 07/27/2023 Name: Bethany Lopez MRN: [id number] DOB: [birth year]  Bethany Lopez is a 73 y.o. year old female who sees Corwin Levins, MD for primary care. I reached out to Nathen May by phone today to offer complex care management services.  Ms. Romito was given information about Complex Care Management services today including:   The Complex Care Management services include support from the care team which includes your Nurse Care Manager, Clinical Social Worker, or Pharmacist.  The Complex Care Management team is here to help remove barriers to the health concerns and goals most important to you. Complex Care Management services are voluntary, and the patient may decline or stop services at any time by request to their care team member.   Complex Care Management Consent Status: Patient agreed to services and verbal consent obtained.   Follow up plan:  Telephone appointment with complex care management team member scheduled for:  4/21  Encounter Outcome:  Patient Scheduled  Gwenevere Ghazi  [facility]  [facility], [facility] Guide  Direct Dial: [phone]  Fax [phone]

## 2023-07-28 DIAGNOSIS — I872 Venous insufficiency (chronic) (peripheral): Secondary | ICD-10-CM | POA: Diagnosis not present

## 2023-07-28 DIAGNOSIS — L97811 Non-pressure chronic ulcer of other part of right lower leg limited to breakdown of skin: Secondary | ICD-10-CM | POA: Diagnosis not present

## 2023-07-28 DIAGNOSIS — J4489 Other specified chronic obstructive pulmonary disease: Secondary | ICD-10-CM | POA: Diagnosis not present

## 2023-07-28 DIAGNOSIS — N179 Acute kidney failure, unspecified: Secondary | ICD-10-CM | POA: Diagnosis not present

## 2023-07-28 DIAGNOSIS — I890 Lymphedema, not elsewhere classified: Secondary | ICD-10-CM | POA: Diagnosis not present

## 2023-07-28 DIAGNOSIS — I10 Essential (primary) hypertension: Secondary | ICD-10-CM | POA: Diagnosis not present

## 2023-07-28 DIAGNOSIS — L97821 Non-pressure chronic ulcer of other part of left lower leg limited to breakdown of skin: Secondary | ICD-10-CM | POA: Diagnosis not present

## 2023-07-28 DIAGNOSIS — L03116 Cellulitis of left lower limb: Secondary | ICD-10-CM | POA: Diagnosis not present

## 2023-07-28 DIAGNOSIS — L03115 Cellulitis of right lower limb: Secondary | ICD-10-CM | POA: Diagnosis not present

## 2023-07-30 ENCOUNTER — Telehealth: Payer: Self-pay | Admitting: Internal Medicine

## 2023-07-30 DIAGNOSIS — N179 Acute kidney failure, unspecified: Secondary | ICD-10-CM | POA: Diagnosis not present

## 2023-07-30 DIAGNOSIS — L03116 Cellulitis of left lower limb: Secondary | ICD-10-CM | POA: Diagnosis not present

## 2023-07-30 DIAGNOSIS — I890 Lymphedema, not elsewhere classified: Secondary | ICD-10-CM | POA: Diagnosis not present

## 2023-07-30 DIAGNOSIS — I872 Venous insufficiency (chronic) (peripheral): Secondary | ICD-10-CM | POA: Diagnosis not present

## 2023-07-30 DIAGNOSIS — J4489 Other specified chronic obstructive pulmonary disease: Secondary | ICD-10-CM | POA: Diagnosis not present

## 2023-07-30 DIAGNOSIS — L97811 Non-pressure chronic ulcer of other part of right lower leg limited to breakdown of skin: Secondary | ICD-10-CM | POA: Diagnosis not present

## 2023-07-30 DIAGNOSIS — L03115 Cellulitis of right lower limb: Secondary | ICD-10-CM | POA: Diagnosis not present

## 2023-07-30 DIAGNOSIS — L97821 Non-pressure chronic ulcer of other part of left lower leg limited to breakdown of skin: Secondary | ICD-10-CM | POA: Diagnosis not present

## 2023-07-30 DIAGNOSIS — I10 Essential (primary) hypertension: Secondary | ICD-10-CM | POA: Diagnosis not present

## 2023-07-30 NOTE — Telephone Encounter (Signed)
 Copied from CRM [id number]. Topic: Clinical - Home Health Verbal Orders >> Jul 30, 2023  1:47 PM Elita Guitar wrote: Caller/Agency: Polly Brink PT w/ [facility] Callback Number: [phone] Service Requested: Physical Therapy Frequency: 1 week 4, and add a social worker eval  Any new concerns about the patient? No

## 2023-07-31 DIAGNOSIS — I10 Essential (primary) hypertension: Secondary | ICD-10-CM | POA: Diagnosis not present

## 2023-07-31 DIAGNOSIS — L97821 Non-pressure chronic ulcer of other part of left lower leg limited to breakdown of skin: Secondary | ICD-10-CM | POA: Diagnosis not present

## 2023-07-31 DIAGNOSIS — L97811 Non-pressure chronic ulcer of other part of right lower leg limited to breakdown of skin: Secondary | ICD-10-CM | POA: Diagnosis not present

## 2023-07-31 DIAGNOSIS — I872 Venous insufficiency (chronic) (peripheral): Secondary | ICD-10-CM | POA: Diagnosis not present

## 2023-07-31 DIAGNOSIS — L03115 Cellulitis of right lower limb: Secondary | ICD-10-CM | POA: Diagnosis not present

## 2023-07-31 DIAGNOSIS — J4489 Other specified chronic obstructive pulmonary disease: Secondary | ICD-10-CM | POA: Diagnosis not present

## 2023-07-31 DIAGNOSIS — I890 Lymphedema, not elsewhere classified: Secondary | ICD-10-CM | POA: Diagnosis not present

## 2023-07-31 DIAGNOSIS — L03116 Cellulitis of left lower limb: Secondary | ICD-10-CM | POA: Diagnosis not present

## 2023-07-31 DIAGNOSIS — N179 Acute kidney failure, unspecified: Secondary | ICD-10-CM | POA: Diagnosis not present

## 2023-08-02 DIAGNOSIS — L97811 Non-pressure chronic ulcer of other part of right lower leg limited to breakdown of skin: Secondary | ICD-10-CM | POA: Diagnosis not present

## 2023-08-02 DIAGNOSIS — L03116 Cellulitis of left lower limb: Secondary | ICD-10-CM | POA: Diagnosis not present

## 2023-08-02 DIAGNOSIS — I872 Venous insufficiency (chronic) (peripheral): Secondary | ICD-10-CM | POA: Diagnosis not present

## 2023-08-02 DIAGNOSIS — I890 Lymphedema, not elsewhere classified: Secondary | ICD-10-CM | POA: Diagnosis not present

## 2023-08-02 DIAGNOSIS — I10 Essential (primary) hypertension: Secondary | ICD-10-CM | POA: Diagnosis not present

## 2023-08-02 DIAGNOSIS — L03115 Cellulitis of right lower limb: Secondary | ICD-10-CM | POA: Diagnosis not present

## 2023-08-02 DIAGNOSIS — N179 Acute kidney failure, unspecified: Secondary | ICD-10-CM | POA: Diagnosis not present

## 2023-08-02 DIAGNOSIS — J4489 Other specified chronic obstructive pulmonary disease: Secondary | ICD-10-CM | POA: Diagnosis not present

## 2023-08-02 DIAGNOSIS — L97821 Non-pressure chronic ulcer of other part of left lower leg limited to breakdown of skin: Secondary | ICD-10-CM | POA: Diagnosis not present

## 2023-08-03 DIAGNOSIS — N179 Acute kidney failure, unspecified: Secondary | ICD-10-CM | POA: Diagnosis not present

## 2023-08-03 DIAGNOSIS — I872 Venous insufficiency (chronic) (peripheral): Secondary | ICD-10-CM | POA: Diagnosis not present

## 2023-08-03 DIAGNOSIS — L97821 Non-pressure chronic ulcer of other part of left lower leg limited to breakdown of skin: Secondary | ICD-10-CM | POA: Diagnosis not present

## 2023-08-03 DIAGNOSIS — L97811 Non-pressure chronic ulcer of other part of right lower leg limited to breakdown of skin: Secondary | ICD-10-CM | POA: Diagnosis not present

## 2023-08-03 DIAGNOSIS — L03116 Cellulitis of left lower limb: Secondary | ICD-10-CM | POA: Diagnosis not present

## 2023-08-03 DIAGNOSIS — L03115 Cellulitis of right lower limb: Secondary | ICD-10-CM | POA: Diagnosis not present

## 2023-08-03 DIAGNOSIS — I890 Lymphedema, not elsewhere classified: Secondary | ICD-10-CM | POA: Diagnosis not present

## 2023-08-03 DIAGNOSIS — I10 Essential (primary) hypertension: Secondary | ICD-10-CM | POA: Diagnosis not present

## 2023-08-03 DIAGNOSIS — J4489 Other specified chronic obstructive pulmonary disease: Secondary | ICD-10-CM | POA: Diagnosis not present

## 2023-08-06 ENCOUNTER — Other Ambulatory Visit: Payer: Self-pay | Admitting: Licensed Clinical Social Worker

## 2023-08-06 NOTE — Patient Outreach (Signed)
 Complex Care Management   Visit Note  08/06/2023  Name:  Bethany Lopez MRN: [id number] DOB: February 26, 1951  Situation: Referral received for Complex Care Management related to [facility]/Behavioral Health diagnosis Depression  I obtained verbal consent from Patient.  Visit completed with Patient  on the phone  Background:   Past Medical History:  Diagnosis Date   Allergic rhinitis, cause unspecified 05/01/2012   Asthma    Carpal tunnel syndrome 06/17/2014   GERD (gastroesophageal reflux disease) 02/11/2015   Hyperlipidemia 05/30/2011   NEC/NOS     Hypertension    Obesity    Thrombocytopenia (HCC) 08/12/2015   Tobacco abuse     Assessment: Patient Reported Symptoms:  Cognitive Cognitive Status: Alert and oriented to person, place, and time, Normal speech and language skills Cognitive/Intellectual Conditions Management [RPT]: None reported or documented in medical history or problem list      Neurological Neurological Review of Symptoms: Not assessed    HEENT HEENT Symptoms Reported: No symptoms reported, Not assessed      Cardiovascular Cardiovascular Symptoms Reported: Not assessed    Respiratory Respiratory Symptoms Reported: No symptoms reported    Endocrine Patient reports the following symptoms related to hypoglycemia or hyperglycemia : No symptoms reported    Gastrointestinal Gastrointestinal Symptoms Reported: No symptoms reported      Genitourinary Genitourinary Symptoms Reported: No symptoms reported    Integumentary Integumentary Symptoms Reported: No symptoms reported    Musculoskeletal Musculoskelatal Symptoms Reviewed: No symptoms reported        Psychosocial Psychosocial Symptoms Reported: No symptoms reported     Quality of Family Relationships: involved, helpful Do you feel physically threatened by others?: No      06/26/2023    3:54 PM  Depression screen PHQ 2/9  Decreased Interest 0  Down, Depressed, Hopeless 0  PHQ - 2 Score 0  Altered  sleeping 0  Tired, decreased energy 0  Change in appetite 0  Feeling bad or failure about yourself  0  Trouble concentrating 0  Moving slowly or fidgety/restless 0  Suicidal thoughts 0  PHQ-9 Score 0  Difficult doing work/chores Not difficult at all    There were no vitals filed for this visit.  Medications Reviewed Today   Medications were not reviewed in this encounter     Recommendation:   Continue taking your medication as prescribed. Follow up with PCP as needed.    Follow Up Plan:   Patient has met all care management goals. Care Management case will be closed. Patient has been provided contact information should new needs arise.   Hale Level, LCSW [facility], [facility] Licensed Clinical Social Worker Care Coordinator [phone]

## 2023-08-06 NOTE — Telephone Encounter (Signed)
 Ok for [employer]

## 2023-08-07 DIAGNOSIS — L03116 Cellulitis of left lower limb: Secondary | ICD-10-CM | POA: Diagnosis not present

## 2023-08-07 DIAGNOSIS — I890 Lymphedema, not elsewhere classified: Secondary | ICD-10-CM | POA: Diagnosis not present

## 2023-08-07 DIAGNOSIS — I10 Essential (primary) hypertension: Secondary | ICD-10-CM | POA: Diagnosis not present

## 2023-08-07 DIAGNOSIS — J4489 Other specified chronic obstructive pulmonary disease: Secondary | ICD-10-CM | POA: Diagnosis not present

## 2023-08-07 DIAGNOSIS — L97811 Non-pressure chronic ulcer of other part of right lower leg limited to breakdown of skin: Secondary | ICD-10-CM | POA: Diagnosis not present

## 2023-08-07 DIAGNOSIS — N179 Acute kidney failure, unspecified: Secondary | ICD-10-CM | POA: Diagnosis not present

## 2023-08-07 DIAGNOSIS — L03115 Cellulitis of right lower limb: Secondary | ICD-10-CM | POA: Diagnosis not present

## 2023-08-07 DIAGNOSIS — L97821 Non-pressure chronic ulcer of other part of left lower leg limited to breakdown of skin: Secondary | ICD-10-CM | POA: Diagnosis not present

## 2023-08-07 DIAGNOSIS — I872 Venous insufficiency (chronic) (peripheral): Secondary | ICD-10-CM | POA: Diagnosis not present

## 2023-08-08 DIAGNOSIS — L03116 Cellulitis of left lower limb: Secondary | ICD-10-CM | POA: Diagnosis not present

## 2023-08-08 DIAGNOSIS — L97811 Non-pressure chronic ulcer of other part of right lower leg limited to breakdown of skin: Secondary | ICD-10-CM | POA: Diagnosis not present

## 2023-08-08 DIAGNOSIS — J4489 Other specified chronic obstructive pulmonary disease: Secondary | ICD-10-CM | POA: Diagnosis not present

## 2023-08-08 DIAGNOSIS — N179 Acute kidney failure, unspecified: Secondary | ICD-10-CM | POA: Diagnosis not present

## 2023-08-08 DIAGNOSIS — I872 Venous insufficiency (chronic) (peripheral): Secondary | ICD-10-CM | POA: Diagnosis not present

## 2023-08-08 DIAGNOSIS — L03115 Cellulitis of right lower limb: Secondary | ICD-10-CM | POA: Diagnosis not present

## 2023-08-08 DIAGNOSIS — L97821 Non-pressure chronic ulcer of other part of left lower leg limited to breakdown of skin: Secondary | ICD-10-CM | POA: Diagnosis not present

## 2023-08-08 DIAGNOSIS — I10 Essential (primary) hypertension: Secondary | ICD-10-CM | POA: Diagnosis not present

## 2023-08-08 DIAGNOSIS — I890 Lymphedema, not elsewhere classified: Secondary | ICD-10-CM | POA: Diagnosis not present

## 2023-08-08 NOTE — Telephone Encounter (Signed)
 Attempted to call and had to leave a voice message. Voice mailbox and note did not specifiy if mail box was secure so I was not able to leave verbal order

## 2023-08-08 NOTE — Telephone Encounter (Signed)
 Polly Brink from [facility] called back and I gave the verbal

## 2023-08-10 DIAGNOSIS — I872 Venous insufficiency (chronic) (peripheral): Secondary | ICD-10-CM | POA: Diagnosis not present

## 2023-08-10 DIAGNOSIS — J4489 Other specified chronic obstructive pulmonary disease: Secondary | ICD-10-CM | POA: Diagnosis not present

## 2023-08-10 DIAGNOSIS — L97821 Non-pressure chronic ulcer of other part of left lower leg limited to breakdown of skin: Secondary | ICD-10-CM | POA: Diagnosis not present

## 2023-08-10 DIAGNOSIS — L97811 Non-pressure chronic ulcer of other part of right lower leg limited to breakdown of skin: Secondary | ICD-10-CM | POA: Diagnosis not present

## 2023-08-10 DIAGNOSIS — I890 Lymphedema, not elsewhere classified: Secondary | ICD-10-CM | POA: Diagnosis not present

## 2023-08-10 DIAGNOSIS — L03116 Cellulitis of left lower limb: Secondary | ICD-10-CM | POA: Diagnosis not present

## 2023-08-10 DIAGNOSIS — L03115 Cellulitis of right lower limb: Secondary | ICD-10-CM | POA: Diagnosis not present

## 2023-08-10 DIAGNOSIS — N179 Acute kidney failure, unspecified: Secondary | ICD-10-CM | POA: Diagnosis not present

## 2023-08-10 DIAGNOSIS — I10 Essential (primary) hypertension: Secondary | ICD-10-CM | POA: Diagnosis not present

## 2023-08-13 ENCOUNTER — Telehealth: Payer: Self-pay | Admitting: Internal Medicine

## 2023-08-13 DIAGNOSIS — L03115 Cellulitis of right lower limb: Secondary | ICD-10-CM | POA: Diagnosis not present

## 2023-08-13 DIAGNOSIS — J4489 Other specified chronic obstructive pulmonary disease: Secondary | ICD-10-CM | POA: Diagnosis not present

## 2023-08-13 DIAGNOSIS — I890 Lymphedema, not elsewhere classified: Secondary | ICD-10-CM | POA: Diagnosis not present

## 2023-08-13 DIAGNOSIS — L03116 Cellulitis of left lower limb: Secondary | ICD-10-CM | POA: Diagnosis not present

## 2023-08-13 DIAGNOSIS — I10 Essential (primary) hypertension: Secondary | ICD-10-CM | POA: Diagnosis not present

## 2023-08-13 DIAGNOSIS — L97811 Non-pressure chronic ulcer of other part of right lower leg limited to breakdown of skin: Secondary | ICD-10-CM | POA: Diagnosis not present

## 2023-08-13 DIAGNOSIS — I872 Venous insufficiency (chronic) (peripheral): Secondary | ICD-10-CM | POA: Diagnosis not present

## 2023-08-13 DIAGNOSIS — L97821 Non-pressure chronic ulcer of other part of left lower leg limited to breakdown of skin: Secondary | ICD-10-CM | POA: Diagnosis not present

## 2023-08-13 DIAGNOSIS — N179 Acute kidney failure, unspecified: Secondary | ICD-10-CM | POA: Diagnosis not present

## 2023-08-13 NOTE — Telephone Encounter (Signed)
 Copied from CRM [id number]. Topic: Clinical - Home Health Verbal Orders >> Aug 13, 2023  1:12 PM Crispin Dolphin wrote: Caller/Agency: Avanell Bob Nurse with [facility] Number: [phone] Service Requested: Wound Care - One wound opened back up Left lower leg  Frequency: 2 times per week continuance  Any new concerns about the patient? Yes -wound opened back up Left lower leg

## 2023-08-13 NOTE — Telephone Encounter (Signed)
 Ok for verbal

## 2023-08-14 NOTE — Telephone Encounter (Signed)
 Called and left voicemail giving verbals.

## 2023-08-15 DIAGNOSIS — N179 Acute kidney failure, unspecified: Secondary | ICD-10-CM | POA: Diagnosis not present

## 2023-08-15 DIAGNOSIS — L97811 Non-pressure chronic ulcer of other part of right lower leg limited to breakdown of skin: Secondary | ICD-10-CM | POA: Diagnosis not present

## 2023-08-15 DIAGNOSIS — L03116 Cellulitis of left lower limb: Secondary | ICD-10-CM | POA: Diagnosis not present

## 2023-08-15 DIAGNOSIS — I872 Venous insufficiency (chronic) (peripheral): Secondary | ICD-10-CM | POA: Diagnosis not present

## 2023-08-15 DIAGNOSIS — L97821 Non-pressure chronic ulcer of other part of left lower leg limited to breakdown of skin: Secondary | ICD-10-CM | POA: Diagnosis not present

## 2023-08-15 DIAGNOSIS — J4489 Other specified chronic obstructive pulmonary disease: Secondary | ICD-10-CM | POA: Diagnosis not present

## 2023-08-15 DIAGNOSIS — I890 Lymphedema, not elsewhere classified: Secondary | ICD-10-CM | POA: Diagnosis not present

## 2023-08-15 DIAGNOSIS — I10 Essential (primary) hypertension: Secondary | ICD-10-CM | POA: Diagnosis not present

## 2023-08-15 DIAGNOSIS — L03115 Cellulitis of right lower limb: Secondary | ICD-10-CM | POA: Diagnosis not present

## 2023-08-16 DIAGNOSIS — L03115 Cellulitis of right lower limb: Secondary | ICD-10-CM | POA: Diagnosis not present

## 2023-08-16 DIAGNOSIS — L97811 Non-pressure chronic ulcer of other part of right lower leg limited to breakdown of skin: Secondary | ICD-10-CM | POA: Diagnosis not present

## 2023-08-16 DIAGNOSIS — I872 Venous insufficiency (chronic) (peripheral): Secondary | ICD-10-CM | POA: Diagnosis not present

## 2023-08-16 DIAGNOSIS — L03116 Cellulitis of left lower limb: Secondary | ICD-10-CM | POA: Diagnosis not present

## 2023-08-16 DIAGNOSIS — J4489 Other specified chronic obstructive pulmonary disease: Secondary | ICD-10-CM | POA: Diagnosis not present

## 2023-08-16 DIAGNOSIS — I10 Essential (primary) hypertension: Secondary | ICD-10-CM | POA: Diagnosis not present

## 2023-08-16 DIAGNOSIS — N179 Acute kidney failure, unspecified: Secondary | ICD-10-CM | POA: Diagnosis not present

## 2023-08-16 DIAGNOSIS — L97821 Non-pressure chronic ulcer of other part of left lower leg limited to breakdown of skin: Secondary | ICD-10-CM | POA: Diagnosis not present

## 2023-08-16 DIAGNOSIS — I890 Lymphedema, not elsewhere classified: Secondary | ICD-10-CM | POA: Diagnosis not present

## 2023-08-20 ENCOUNTER — Other Ambulatory Visit: Payer: Self-pay | Admitting: Internal Medicine

## 2023-08-20 ENCOUNTER — Other Ambulatory Visit: Payer: Self-pay

## 2023-08-20 MED ORDER — AMLODIPINE BESYLATE 5 MG PO TABS: 5.0000 mg | ORAL_TABLET | Freq: Every day | ORAL | 0 refills | Status: DC

## 2023-08-20 NOTE — Telephone Encounter (Unsigned)
 Copied from CRM [id number]. Topic: Clinical - Medication Refill >> Aug 20, 2023  9:36 AM Orien Bird wrote: Most Recent Primary Care Visit:  Provider: Roslyn Coombe  Department: [facility]  Visit Type: HOSPITAL FU  Date: 07/26/2023  Medication: amLODipine  (NORVASC ) 5 MG tablet  Has the patient contacted their pharmacy? Yes (Agent: If no, request that the patient contact the pharmacy for the refill. If patient does not wish to contact the pharmacy document the reason why and proceed with request.) (Agent: If yes, when and what did the pharmacy advise?)  Is this the correct pharmacy for this prescription? Yes If no, delete pharmacy and type the correct one.  This is the patient's preferred pharmacy:  [facility] [address], [place] - [address] [place] [address] Phone: [phone] Fax: [phone]   Has the prescription been filled recently? Yes  Is the patient out of the medication? Yes  Has the patient been seen for an appointment in the last year OR does the patient have an upcoming appointment? Yes  Can we respond through MyChart? No  Agent: Please be advised that Rx refills may take up to 3 business days. We ask that you follow-up with your pharmacy.

## 2023-08-21 ENCOUNTER — Telehealth: Payer: Self-pay | Admitting: Internal Medicine

## 2023-08-21 DIAGNOSIS — L97821 Non-pressure chronic ulcer of other part of left lower leg limited to breakdown of skin: Secondary | ICD-10-CM | POA: Diagnosis not present

## 2023-08-21 DIAGNOSIS — N179 Acute kidney failure, unspecified: Secondary | ICD-10-CM | POA: Diagnosis not present

## 2023-08-21 DIAGNOSIS — J4489 Other specified chronic obstructive pulmonary disease: Secondary | ICD-10-CM | POA: Diagnosis not present

## 2023-08-21 DIAGNOSIS — I10 Essential (primary) hypertension: Secondary | ICD-10-CM | POA: Diagnosis not present

## 2023-08-21 DIAGNOSIS — L03116 Cellulitis of left lower limb: Secondary | ICD-10-CM | POA: Diagnosis not present

## 2023-08-21 DIAGNOSIS — L03115 Cellulitis of right lower limb: Secondary | ICD-10-CM | POA: Diagnosis not present

## 2023-08-21 DIAGNOSIS — I872 Venous insufficiency (chronic) (peripheral): Secondary | ICD-10-CM | POA: Diagnosis not present

## 2023-08-21 DIAGNOSIS — L97811 Non-pressure chronic ulcer of other part of right lower leg limited to breakdown of skin: Secondary | ICD-10-CM | POA: Diagnosis not present

## 2023-08-21 DIAGNOSIS — I890 Lymphedema, not elsewhere classified: Secondary | ICD-10-CM | POA: Diagnosis not present

## 2023-08-21 NOTE — Telephone Encounter (Signed)
 Ok for verbal

## 2023-08-21 NOTE — Telephone Encounter (Signed)
 Copied from CRM [id number]. Topic: Clinical - Home Health Verbal Orders >> Aug 21, 2023  8:12 AM Allison Arena wrote: Caller/Agency: Alston Jerry, PT with [facility] Callback Number: [phone] Service Requested: Physical Therapy, extend services. Frequency: 1x / week for 3 weeks beginning next week. Any new concerns about the patient? No

## 2023-08-22 DIAGNOSIS — I890 Lymphedema, not elsewhere classified: Secondary | ICD-10-CM | POA: Diagnosis not present

## 2023-08-22 DIAGNOSIS — I10 Essential (primary) hypertension: Secondary | ICD-10-CM | POA: Diagnosis not present

## 2023-08-22 DIAGNOSIS — L97811 Non-pressure chronic ulcer of other part of right lower leg limited to breakdown of skin: Secondary | ICD-10-CM | POA: Diagnosis not present

## 2023-08-22 DIAGNOSIS — L03116 Cellulitis of left lower limb: Secondary | ICD-10-CM | POA: Diagnosis not present

## 2023-08-22 DIAGNOSIS — J4489 Other specified chronic obstructive pulmonary disease: Secondary | ICD-10-CM | POA: Diagnosis not present

## 2023-08-22 DIAGNOSIS — L97821 Non-pressure chronic ulcer of other part of left lower leg limited to breakdown of skin: Secondary | ICD-10-CM | POA: Diagnosis not present

## 2023-08-22 DIAGNOSIS — N179 Acute kidney failure, unspecified: Secondary | ICD-10-CM | POA: Diagnosis not present

## 2023-08-22 DIAGNOSIS — I872 Venous insufficiency (chronic) (peripheral): Secondary | ICD-10-CM | POA: Diagnosis not present

## 2023-08-22 DIAGNOSIS — L03115 Cellulitis of right lower limb: Secondary | ICD-10-CM | POA: Diagnosis not present

## 2023-08-22 NOTE — Telephone Encounter (Signed)
 Called and left voicemail giving verbals.

## 2023-08-24 DIAGNOSIS — L97811 Non-pressure chronic ulcer of other part of right lower leg limited to breakdown of skin: Secondary | ICD-10-CM | POA: Diagnosis not present

## 2023-08-24 DIAGNOSIS — J4489 Other specified chronic obstructive pulmonary disease: Secondary | ICD-10-CM | POA: Diagnosis not present

## 2023-08-24 DIAGNOSIS — N179 Acute kidney failure, unspecified: Secondary | ICD-10-CM | POA: Diagnosis not present

## 2023-08-24 DIAGNOSIS — I890 Lymphedema, not elsewhere classified: Secondary | ICD-10-CM | POA: Diagnosis not present

## 2023-08-24 DIAGNOSIS — I872 Venous insufficiency (chronic) (peripheral): Secondary | ICD-10-CM | POA: Diagnosis not present

## 2023-08-24 DIAGNOSIS — L03115 Cellulitis of right lower limb: Secondary | ICD-10-CM | POA: Diagnosis not present

## 2023-08-24 DIAGNOSIS — L97821 Non-pressure chronic ulcer of other part of left lower leg limited to breakdown of skin: Secondary | ICD-10-CM | POA: Diagnosis not present

## 2023-08-24 DIAGNOSIS — I10 Essential (primary) hypertension: Secondary | ICD-10-CM | POA: Diagnosis not present

## 2023-08-24 DIAGNOSIS — L03116 Cellulitis of left lower limb: Secondary | ICD-10-CM | POA: Diagnosis not present

## 2023-08-28 DIAGNOSIS — L97811 Non-pressure chronic ulcer of other part of right lower leg limited to breakdown of skin: Secondary | ICD-10-CM | POA: Diagnosis not present

## 2023-08-28 DIAGNOSIS — L03115 Cellulitis of right lower limb: Secondary | ICD-10-CM | POA: Diagnosis not present

## 2023-08-28 DIAGNOSIS — L97821 Non-pressure chronic ulcer of other part of left lower leg limited to breakdown of skin: Secondary | ICD-10-CM | POA: Diagnosis not present

## 2023-08-28 DIAGNOSIS — I10 Essential (primary) hypertension: Secondary | ICD-10-CM | POA: Diagnosis not present

## 2023-08-28 DIAGNOSIS — L03116 Cellulitis of left lower limb: Secondary | ICD-10-CM | POA: Diagnosis not present

## 2023-08-28 DIAGNOSIS — I872 Venous insufficiency (chronic) (peripheral): Secondary | ICD-10-CM | POA: Diagnosis not present

## 2023-08-28 DIAGNOSIS — I890 Lymphedema, not elsewhere classified: Secondary | ICD-10-CM | POA: Diagnosis not present

## 2023-08-28 DIAGNOSIS — J4489 Other specified chronic obstructive pulmonary disease: Secondary | ICD-10-CM | POA: Diagnosis not present

## 2023-08-28 DIAGNOSIS — N179 Acute kidney failure, unspecified: Secondary | ICD-10-CM | POA: Diagnosis not present

## 2023-08-30 DIAGNOSIS — J4489 Other specified chronic obstructive pulmonary disease: Secondary | ICD-10-CM | POA: Diagnosis not present

## 2023-08-30 DIAGNOSIS — I872 Venous insufficiency (chronic) (peripheral): Secondary | ICD-10-CM | POA: Diagnosis not present

## 2023-08-30 DIAGNOSIS — L03116 Cellulitis of left lower limb: Secondary | ICD-10-CM | POA: Diagnosis not present

## 2023-08-30 DIAGNOSIS — I890 Lymphedema, not elsewhere classified: Secondary | ICD-10-CM | POA: Diagnosis not present

## 2023-08-30 DIAGNOSIS — I10 Essential (primary) hypertension: Secondary | ICD-10-CM | POA: Diagnosis not present

## 2023-08-30 DIAGNOSIS — L03115 Cellulitis of right lower limb: Secondary | ICD-10-CM | POA: Diagnosis not present

## 2023-08-30 DIAGNOSIS — N179 Acute kidney failure, unspecified: Secondary | ICD-10-CM | POA: Diagnosis not present

## 2023-08-30 DIAGNOSIS — L97821 Non-pressure chronic ulcer of other part of left lower leg limited to breakdown of skin: Secondary | ICD-10-CM | POA: Diagnosis not present

## 2023-08-30 DIAGNOSIS — L97811 Non-pressure chronic ulcer of other part of right lower leg limited to breakdown of skin: Secondary | ICD-10-CM | POA: Diagnosis not present

## 2023-08-31 DIAGNOSIS — L03116 Cellulitis of left lower limb: Secondary | ICD-10-CM | POA: Diagnosis not present

## 2023-08-31 DIAGNOSIS — L97821 Non-pressure chronic ulcer of other part of left lower leg limited to breakdown of skin: Secondary | ICD-10-CM | POA: Diagnosis not present

## 2023-08-31 DIAGNOSIS — I872 Venous insufficiency (chronic) (peripheral): Secondary | ICD-10-CM | POA: Diagnosis not present

## 2023-08-31 DIAGNOSIS — N179 Acute kidney failure, unspecified: Secondary | ICD-10-CM | POA: Diagnosis not present

## 2023-08-31 DIAGNOSIS — J4489 Other specified chronic obstructive pulmonary disease: Secondary | ICD-10-CM | POA: Diagnosis not present

## 2023-08-31 DIAGNOSIS — I10 Essential (primary) hypertension: Secondary | ICD-10-CM | POA: Diagnosis not present

## 2023-08-31 DIAGNOSIS — L03115 Cellulitis of right lower limb: Secondary | ICD-10-CM | POA: Diagnosis not present

## 2023-08-31 DIAGNOSIS — L97811 Non-pressure chronic ulcer of other part of right lower leg limited to breakdown of skin: Secondary | ICD-10-CM | POA: Diagnosis not present

## 2023-08-31 DIAGNOSIS — I890 Lymphedema, not elsewhere classified: Secondary | ICD-10-CM | POA: Diagnosis not present

## 2023-09-03 ENCOUNTER — Telehealth: Payer: Self-pay

## 2023-09-03 NOTE — Telephone Encounter (Signed)
 Copied from CRM [id number]. Topic: Clinical - Home Health Verbal Orders >> Aug 31, 2023 11:11 AM Artemio Larry wrote: Caller/Agency: Avanell Bob from [facility]  Callback Number: [phone] Service Requested: Skilled Nursing Frequency: Wants to increase to 2x per week for wound care  Any new concerns about the patient? No

## 2023-09-03 NOTE — Telephone Encounter (Signed)
 Ok for [employer]

## 2023-09-03 NOTE — Telephone Encounter (Signed)
 Called and left voicemail giving verbals.

## 2023-09-04 DIAGNOSIS — N179 Acute kidney failure, unspecified: Secondary | ICD-10-CM | POA: Diagnosis not present

## 2023-09-04 DIAGNOSIS — L03116 Cellulitis of left lower limb: Secondary | ICD-10-CM | POA: Diagnosis not present

## 2023-09-04 DIAGNOSIS — L97821 Non-pressure chronic ulcer of other part of left lower leg limited to breakdown of skin: Secondary | ICD-10-CM | POA: Diagnosis not present

## 2023-09-04 DIAGNOSIS — I872 Venous insufficiency (chronic) (peripheral): Secondary | ICD-10-CM | POA: Diagnosis not present

## 2023-09-04 DIAGNOSIS — I10 Essential (primary) hypertension: Secondary | ICD-10-CM | POA: Diagnosis not present

## 2023-09-04 DIAGNOSIS — J4489 Other specified chronic obstructive pulmonary disease: Secondary | ICD-10-CM | POA: Diagnosis not present

## 2023-09-04 DIAGNOSIS — L97811 Non-pressure chronic ulcer of other part of right lower leg limited to breakdown of skin: Secondary | ICD-10-CM | POA: Diagnosis not present

## 2023-09-04 DIAGNOSIS — L03115 Cellulitis of right lower limb: Secondary | ICD-10-CM | POA: Diagnosis not present

## 2023-09-04 DIAGNOSIS — I890 Lymphedema, not elsewhere classified: Secondary | ICD-10-CM | POA: Diagnosis not present

## 2023-09-05 DIAGNOSIS — I10 Essential (primary) hypertension: Secondary | ICD-10-CM | POA: Diagnosis not present

## 2023-09-05 DIAGNOSIS — L03116 Cellulitis of left lower limb: Secondary | ICD-10-CM | POA: Diagnosis not present

## 2023-09-05 DIAGNOSIS — L03115 Cellulitis of right lower limb: Secondary | ICD-10-CM | POA: Diagnosis not present

## 2023-09-05 DIAGNOSIS — I890 Lymphedema, not elsewhere classified: Secondary | ICD-10-CM | POA: Diagnosis not present

## 2023-09-05 DIAGNOSIS — L97811 Non-pressure chronic ulcer of other part of right lower leg limited to breakdown of skin: Secondary | ICD-10-CM | POA: Diagnosis not present

## 2023-09-05 DIAGNOSIS — J4489 Other specified chronic obstructive pulmonary disease: Secondary | ICD-10-CM | POA: Diagnosis not present

## 2023-09-05 DIAGNOSIS — N179 Acute kidney failure, unspecified: Secondary | ICD-10-CM | POA: Diagnosis not present

## 2023-09-05 DIAGNOSIS — I872 Venous insufficiency (chronic) (peripheral): Secondary | ICD-10-CM | POA: Diagnosis not present

## 2023-09-05 DIAGNOSIS — L97821 Non-pressure chronic ulcer of other part of left lower leg limited to breakdown of skin: Secondary | ICD-10-CM | POA: Diagnosis not present

## 2023-09-07 ENCOUNTER — Telehealth: Payer: Self-pay | Admitting: Internal Medicine

## 2023-09-07 DIAGNOSIS — L97821 Non-pressure chronic ulcer of other part of left lower leg limited to breakdown of skin: Secondary | ICD-10-CM | POA: Diagnosis not present

## 2023-09-07 DIAGNOSIS — N179 Acute kidney failure, unspecified: Secondary | ICD-10-CM | POA: Diagnosis not present

## 2023-09-07 DIAGNOSIS — L97811 Non-pressure chronic ulcer of other part of right lower leg limited to breakdown of skin: Secondary | ICD-10-CM | POA: Diagnosis not present

## 2023-09-07 DIAGNOSIS — I10 Essential (primary) hypertension: Secondary | ICD-10-CM | POA: Diagnosis not present

## 2023-09-07 DIAGNOSIS — L03116 Cellulitis of left lower limb: Secondary | ICD-10-CM | POA: Diagnosis not present

## 2023-09-07 DIAGNOSIS — L03115 Cellulitis of right lower limb: Secondary | ICD-10-CM | POA: Diagnosis not present

## 2023-09-07 DIAGNOSIS — J4489 Other specified chronic obstructive pulmonary disease: Secondary | ICD-10-CM | POA: Diagnosis not present

## 2023-09-07 DIAGNOSIS — I872 Venous insufficiency (chronic) (peripheral): Secondary | ICD-10-CM | POA: Diagnosis not present

## 2023-09-07 DIAGNOSIS — I890 Lymphedema, not elsewhere classified: Secondary | ICD-10-CM | POA: Diagnosis not present

## 2023-09-07 NOTE — Telephone Encounter (Signed)
 Copied from CRM [id number]. Topic: General - Other >> Sep 07, 2023 12:18 PM Jenice Mitts wrote: Reason for CRM: Avanell Bob a nurse from [facility] is calling to let Dr Autry Legions know the patient is getting an ulcer on her right shoulder that she is going to treat with the other

## 2023-09-07 NOTE — Telephone Encounter (Signed)
Ok this is noted 

## 2023-09-11 ENCOUNTER — Ambulatory Visit: Admitting: Physician Assistant

## 2023-09-11 DIAGNOSIS — I10 Essential (primary) hypertension: Secondary | ICD-10-CM | POA: Diagnosis not present

## 2023-09-11 DIAGNOSIS — L03115 Cellulitis of right lower limb: Secondary | ICD-10-CM | POA: Diagnosis not present

## 2023-09-11 DIAGNOSIS — L97821 Non-pressure chronic ulcer of other part of left lower leg limited to breakdown of skin: Secondary | ICD-10-CM | POA: Diagnosis not present

## 2023-09-11 DIAGNOSIS — J4489 Other specified chronic obstructive pulmonary disease: Secondary | ICD-10-CM | POA: Diagnosis not present

## 2023-09-11 DIAGNOSIS — L97811 Non-pressure chronic ulcer of other part of right lower leg limited to breakdown of skin: Secondary | ICD-10-CM | POA: Diagnosis not present

## 2023-09-11 DIAGNOSIS — I872 Venous insufficiency (chronic) (peripheral): Secondary | ICD-10-CM | POA: Diagnosis not present

## 2023-09-11 DIAGNOSIS — N179 Acute kidney failure, unspecified: Secondary | ICD-10-CM | POA: Diagnosis not present

## 2023-09-11 DIAGNOSIS — L03116 Cellulitis of left lower limb: Secondary | ICD-10-CM | POA: Diagnosis not present

## 2023-09-11 DIAGNOSIS — I890 Lymphedema, not elsewhere classified: Secondary | ICD-10-CM | POA: Diagnosis not present

## 2023-09-12 DIAGNOSIS — N179 Acute kidney failure, unspecified: Secondary | ICD-10-CM | POA: Diagnosis not present

## 2023-09-12 DIAGNOSIS — L97821 Non-pressure chronic ulcer of other part of left lower leg limited to breakdown of skin: Secondary | ICD-10-CM | POA: Diagnosis not present

## 2023-09-12 DIAGNOSIS — L97811 Non-pressure chronic ulcer of other part of right lower leg limited to breakdown of skin: Secondary | ICD-10-CM | POA: Diagnosis not present

## 2023-09-12 DIAGNOSIS — L03115 Cellulitis of right lower limb: Secondary | ICD-10-CM | POA: Diagnosis not present

## 2023-09-12 DIAGNOSIS — I10 Essential (primary) hypertension: Secondary | ICD-10-CM | POA: Diagnosis not present

## 2023-09-12 DIAGNOSIS — I890 Lymphedema, not elsewhere classified: Secondary | ICD-10-CM | POA: Diagnosis not present

## 2023-09-12 DIAGNOSIS — I872 Venous insufficiency (chronic) (peripheral): Secondary | ICD-10-CM | POA: Diagnosis not present

## 2023-09-12 DIAGNOSIS — J4489 Other specified chronic obstructive pulmonary disease: Secondary | ICD-10-CM | POA: Diagnosis not present

## 2023-09-12 DIAGNOSIS — L03116 Cellulitis of left lower limb: Secondary | ICD-10-CM | POA: Diagnosis not present

## 2023-09-14 DIAGNOSIS — L97811 Non-pressure chronic ulcer of other part of right lower leg limited to breakdown of skin: Secondary | ICD-10-CM | POA: Diagnosis not present

## 2023-09-14 DIAGNOSIS — J4489 Other specified chronic obstructive pulmonary disease: Secondary | ICD-10-CM | POA: Diagnosis not present

## 2023-09-14 DIAGNOSIS — L97821 Non-pressure chronic ulcer of other part of left lower leg limited to breakdown of skin: Secondary | ICD-10-CM | POA: Diagnosis not present

## 2023-09-14 DIAGNOSIS — I890 Lymphedema, not elsewhere classified: Secondary | ICD-10-CM | POA: Diagnosis not present

## 2023-09-14 DIAGNOSIS — L03115 Cellulitis of right lower limb: Secondary | ICD-10-CM | POA: Diagnosis not present

## 2023-09-14 DIAGNOSIS — N179 Acute kidney failure, unspecified: Secondary | ICD-10-CM | POA: Diagnosis not present

## 2023-09-14 DIAGNOSIS — I872 Venous insufficiency (chronic) (peripheral): Secondary | ICD-10-CM | POA: Diagnosis not present

## 2023-09-14 DIAGNOSIS — L03116 Cellulitis of left lower limb: Secondary | ICD-10-CM | POA: Diagnosis not present

## 2023-09-14 DIAGNOSIS — I10 Essential (primary) hypertension: Secondary | ICD-10-CM | POA: Diagnosis not present

## 2023-09-18 DIAGNOSIS — L97811 Non-pressure chronic ulcer of other part of right lower leg limited to breakdown of skin: Secondary | ICD-10-CM | POA: Diagnosis not present

## 2023-09-18 DIAGNOSIS — L03115 Cellulitis of right lower limb: Secondary | ICD-10-CM | POA: Diagnosis not present

## 2023-09-18 DIAGNOSIS — J4489 Other specified chronic obstructive pulmonary disease: Secondary | ICD-10-CM | POA: Diagnosis not present

## 2023-09-18 DIAGNOSIS — L97821 Non-pressure chronic ulcer of other part of left lower leg limited to breakdown of skin: Secondary | ICD-10-CM | POA: Diagnosis not present

## 2023-09-18 DIAGNOSIS — N179 Acute kidney failure, unspecified: Secondary | ICD-10-CM | POA: Diagnosis not present

## 2023-09-18 DIAGNOSIS — I872 Venous insufficiency (chronic) (peripheral): Secondary | ICD-10-CM | POA: Diagnosis not present

## 2023-09-18 DIAGNOSIS — I890 Lymphedema, not elsewhere classified: Secondary | ICD-10-CM | POA: Diagnosis not present

## 2023-09-18 DIAGNOSIS — L03116 Cellulitis of left lower limb: Secondary | ICD-10-CM | POA: Diagnosis not present

## 2023-09-18 DIAGNOSIS — I10 Essential (primary) hypertension: Secondary | ICD-10-CM | POA: Diagnosis not present

## 2023-09-21 DIAGNOSIS — L03116 Cellulitis of left lower limb: Secondary | ICD-10-CM | POA: Diagnosis not present

## 2023-09-21 DIAGNOSIS — I890 Lymphedema, not elsewhere classified: Secondary | ICD-10-CM | POA: Diagnosis not present

## 2023-09-21 DIAGNOSIS — L03115 Cellulitis of right lower limb: Secondary | ICD-10-CM | POA: Diagnosis not present

## 2023-09-21 DIAGNOSIS — I872 Venous insufficiency (chronic) (peripheral): Secondary | ICD-10-CM | POA: Diagnosis not present

## 2023-09-21 DIAGNOSIS — L97811 Non-pressure chronic ulcer of other part of right lower leg limited to breakdown of skin: Secondary | ICD-10-CM | POA: Diagnosis not present

## 2023-09-21 DIAGNOSIS — L97821 Non-pressure chronic ulcer of other part of left lower leg limited to breakdown of skin: Secondary | ICD-10-CM | POA: Diagnosis not present

## 2023-09-21 DIAGNOSIS — J4489 Other specified chronic obstructive pulmonary disease: Secondary | ICD-10-CM | POA: Diagnosis not present

## 2023-09-21 DIAGNOSIS — N179 Acute kidney failure, unspecified: Secondary | ICD-10-CM | POA: Diagnosis not present

## 2023-09-21 DIAGNOSIS — I10 Essential (primary) hypertension: Secondary | ICD-10-CM | POA: Diagnosis not present

## 2023-09-25 DIAGNOSIS — J4489 Other specified chronic obstructive pulmonary disease: Secondary | ICD-10-CM | POA: Diagnosis not present

## 2023-09-25 DIAGNOSIS — I890 Lymphedema, not elsewhere classified: Secondary | ICD-10-CM | POA: Diagnosis not present

## 2023-09-25 DIAGNOSIS — J439 Emphysema, unspecified: Secondary | ICD-10-CM | POA: Diagnosis not present

## 2023-09-25 DIAGNOSIS — D509 Iron deficiency anemia, unspecified: Secondary | ICD-10-CM | POA: Diagnosis not present

## 2023-09-25 DIAGNOSIS — E785 Hyperlipidemia, unspecified: Secondary | ICD-10-CM | POA: Diagnosis not present

## 2023-09-25 DIAGNOSIS — D696 Thrombocytopenia, unspecified: Secondary | ICD-10-CM | POA: Diagnosis not present

## 2023-09-25 DIAGNOSIS — L97821 Non-pressure chronic ulcer of other part of left lower leg limited to breakdown of skin: Secondary | ICD-10-CM | POA: Diagnosis not present

## 2023-09-25 DIAGNOSIS — I872 Venous insufficiency (chronic) (peripheral): Secondary | ICD-10-CM | POA: Diagnosis not present

## 2023-09-25 DIAGNOSIS — I10 Essential (primary) hypertension: Secondary | ICD-10-CM | POA: Diagnosis not present

## 2023-09-28 ENCOUNTER — Telehealth: Payer: Self-pay | Admitting: Internal Medicine

## 2023-09-28 DIAGNOSIS — D509 Iron deficiency anemia, unspecified: Secondary | ICD-10-CM | POA: Diagnosis not present

## 2023-09-28 DIAGNOSIS — J439 Emphysema, unspecified: Secondary | ICD-10-CM | POA: Diagnosis not present

## 2023-09-28 DIAGNOSIS — I10 Essential (primary) hypertension: Secondary | ICD-10-CM | POA: Diagnosis not present

## 2023-09-28 DIAGNOSIS — L97821 Non-pressure chronic ulcer of other part of left lower leg limited to breakdown of skin: Secondary | ICD-10-CM | POA: Diagnosis not present

## 2023-09-28 DIAGNOSIS — E785 Hyperlipidemia, unspecified: Secondary | ICD-10-CM | POA: Diagnosis not present

## 2023-09-28 DIAGNOSIS — J4489 Other specified chronic obstructive pulmonary disease: Secondary | ICD-10-CM | POA: Diagnosis not present

## 2023-09-28 DIAGNOSIS — I872 Venous insufficiency (chronic) (peripheral): Secondary | ICD-10-CM | POA: Diagnosis not present

## 2023-09-28 DIAGNOSIS — D696 Thrombocytopenia, unspecified: Secondary | ICD-10-CM | POA: Diagnosis not present

## 2023-09-28 DIAGNOSIS — I890 Lymphedema, not elsewhere classified: Secondary | ICD-10-CM | POA: Diagnosis not present

## 2023-09-28 NOTE — Telephone Encounter (Unsigned)
 Copied from CRM [id number]. Topic: General - Other >> Sep 28, 2023  2:27 PM Dorisann Garre T wrote: Reason for CRM: patient had blisters on her left leg that the home health care nurse seen she soaked her leg in epsom  salt and it helped it     and she stated that if she needs antibiotics she is willing to take them she just wanted to make the dr aware

## 2023-10-02 DIAGNOSIS — J4489 Other specified chronic obstructive pulmonary disease: Secondary | ICD-10-CM | POA: Diagnosis not present

## 2023-10-02 DIAGNOSIS — L97821 Non-pressure chronic ulcer of other part of left lower leg limited to breakdown of skin: Secondary | ICD-10-CM | POA: Diagnosis not present

## 2023-10-02 DIAGNOSIS — I890 Lymphedema, not elsewhere classified: Secondary | ICD-10-CM | POA: Diagnosis not present

## 2023-10-02 DIAGNOSIS — D696 Thrombocytopenia, unspecified: Secondary | ICD-10-CM | POA: Diagnosis not present

## 2023-10-02 DIAGNOSIS — E785 Hyperlipidemia, unspecified: Secondary | ICD-10-CM | POA: Diagnosis not present

## 2023-10-02 DIAGNOSIS — I10 Essential (primary) hypertension: Secondary | ICD-10-CM | POA: Diagnosis not present

## 2023-10-02 DIAGNOSIS — I872 Venous insufficiency (chronic) (peripheral): Secondary | ICD-10-CM | POA: Diagnosis not present

## 2023-10-02 DIAGNOSIS — J439 Emphysema, unspecified: Secondary | ICD-10-CM | POA: Diagnosis not present

## 2023-10-02 DIAGNOSIS — D509 Iron deficiency anemia, unspecified: Secondary | ICD-10-CM | POA: Diagnosis not present

## 2023-10-04 NOTE — Telephone Encounter (Signed)
 We would need antibiotic only if there is more than blisters, like redness, swelling, pain or drainage, fever , chills or other unusual problems.  So consider ROV if these develop

## 2023-10-04 NOTE — Telephone Encounter (Signed)
 Called patient back as she was upset no one returned her call from Friday 6/13. Informed patient was written as an FYI to provider not that she wanted to start a medication for possible infection.  Patient was VERY upset that she had not gotten a call back. I updated the patient on how the message was sent and that we will gladly take care of her. She expressed her frustrations and was reasured with my name and title if she has any future issues. She was educated based off todays message Dr. Autry Legions stated she only needs ROV is having increased symptoms. I reviewed these symptoms in detail and she stated she is not having that issue, and it has been getting better with the epson salt baths and HH nurse coming.  Apologized for the misunderstanding and she was thankful for the information.

## 2023-10-04 NOTE — Telephone Encounter (Signed)
 Copied from CRM [id number]. Topic: General - Other >> Oct 04, 2023 12:29 PM Bethany Lopez wrote: Patient never received a response and would like if she could have instruction on how to care for her blisters or if there's a medication that needs to be sent in. The patient would also like to be communicated with if an appointment is needed.

## 2023-10-04 NOTE — Telephone Encounter (Signed)
 Called and left detailed message with Dr.John's response and call back number for appointment if needed

## 2023-10-05 DIAGNOSIS — I10 Essential (primary) hypertension: Secondary | ICD-10-CM | POA: Diagnosis not present

## 2023-10-05 DIAGNOSIS — D509 Iron deficiency anemia, unspecified: Secondary | ICD-10-CM | POA: Diagnosis not present

## 2023-10-05 DIAGNOSIS — D696 Thrombocytopenia, unspecified: Secondary | ICD-10-CM | POA: Diagnosis not present

## 2023-10-05 DIAGNOSIS — J4489 Other specified chronic obstructive pulmonary disease: Secondary | ICD-10-CM | POA: Diagnosis not present

## 2023-10-05 DIAGNOSIS — I872 Venous insufficiency (chronic) (peripheral): Secondary | ICD-10-CM | POA: Diagnosis not present

## 2023-10-05 DIAGNOSIS — E785 Hyperlipidemia, unspecified: Secondary | ICD-10-CM | POA: Diagnosis not present

## 2023-10-05 DIAGNOSIS — J439 Emphysema, unspecified: Secondary | ICD-10-CM | POA: Diagnosis not present

## 2023-10-05 DIAGNOSIS — I890 Lymphedema, not elsewhere classified: Secondary | ICD-10-CM | POA: Diagnosis not present

## 2023-10-05 DIAGNOSIS — L97821 Non-pressure chronic ulcer of other part of left lower leg limited to breakdown of skin: Secondary | ICD-10-CM | POA: Diagnosis not present

## 2023-10-09 DIAGNOSIS — I872 Venous insufficiency (chronic) (peripheral): Secondary | ICD-10-CM | POA: Diagnosis not present

## 2023-10-09 DIAGNOSIS — J439 Emphysema, unspecified: Secondary | ICD-10-CM | POA: Diagnosis not present

## 2023-10-09 DIAGNOSIS — L97821 Non-pressure chronic ulcer of other part of left lower leg limited to breakdown of skin: Secondary | ICD-10-CM | POA: Diagnosis not present

## 2023-10-09 DIAGNOSIS — D696 Thrombocytopenia, unspecified: Secondary | ICD-10-CM | POA: Diagnosis not present

## 2023-10-09 DIAGNOSIS — J4489 Other specified chronic obstructive pulmonary disease: Secondary | ICD-10-CM | POA: Diagnosis not present

## 2023-10-09 DIAGNOSIS — I10 Essential (primary) hypertension: Secondary | ICD-10-CM | POA: Diagnosis not present

## 2023-10-09 DIAGNOSIS — E785 Hyperlipidemia, unspecified: Secondary | ICD-10-CM | POA: Diagnosis not present

## 2023-10-09 DIAGNOSIS — D509 Iron deficiency anemia, unspecified: Secondary | ICD-10-CM | POA: Diagnosis not present

## 2023-10-09 DIAGNOSIS — I890 Lymphedema, not elsewhere classified: Secondary | ICD-10-CM | POA: Diagnosis not present

## 2023-10-12 DIAGNOSIS — I890 Lymphedema, not elsewhere classified: Secondary | ICD-10-CM | POA: Diagnosis not present

## 2023-10-12 DIAGNOSIS — D696 Thrombocytopenia, unspecified: Secondary | ICD-10-CM | POA: Diagnosis not present

## 2023-10-12 DIAGNOSIS — E785 Hyperlipidemia, unspecified: Secondary | ICD-10-CM | POA: Diagnosis not present

## 2023-10-12 DIAGNOSIS — I872 Venous insufficiency (chronic) (peripheral): Secondary | ICD-10-CM | POA: Diagnosis not present

## 2023-10-12 DIAGNOSIS — J4489 Other specified chronic obstructive pulmonary disease: Secondary | ICD-10-CM | POA: Diagnosis not present

## 2023-10-12 DIAGNOSIS — L97821 Non-pressure chronic ulcer of other part of left lower leg limited to breakdown of skin: Secondary | ICD-10-CM | POA: Diagnosis not present

## 2023-10-12 DIAGNOSIS — D509 Iron deficiency anemia, unspecified: Secondary | ICD-10-CM | POA: Diagnosis not present

## 2023-10-12 DIAGNOSIS — J439 Emphysema, unspecified: Secondary | ICD-10-CM | POA: Diagnosis not present

## 2023-10-12 DIAGNOSIS — I10 Essential (primary) hypertension: Secondary | ICD-10-CM | POA: Diagnosis not present

## 2023-10-16 DIAGNOSIS — I872 Venous insufficiency (chronic) (peripheral): Secondary | ICD-10-CM | POA: Diagnosis not present

## 2023-10-16 DIAGNOSIS — L97821 Non-pressure chronic ulcer of other part of left lower leg limited to breakdown of skin: Secondary | ICD-10-CM | POA: Diagnosis not present

## 2023-10-16 DIAGNOSIS — J4489 Other specified chronic obstructive pulmonary disease: Secondary | ICD-10-CM | POA: Diagnosis not present

## 2023-10-16 DIAGNOSIS — E785 Hyperlipidemia, unspecified: Secondary | ICD-10-CM | POA: Diagnosis not present

## 2023-10-16 DIAGNOSIS — J439 Emphysema, unspecified: Secondary | ICD-10-CM | POA: Diagnosis not present

## 2023-10-16 DIAGNOSIS — I890 Lymphedema, not elsewhere classified: Secondary | ICD-10-CM | POA: Diagnosis not present

## 2023-10-16 DIAGNOSIS — D509 Iron deficiency anemia, unspecified: Secondary | ICD-10-CM | POA: Diagnosis not present

## 2023-10-16 DIAGNOSIS — I10 Essential (primary) hypertension: Secondary | ICD-10-CM | POA: Diagnosis not present

## 2023-10-16 DIAGNOSIS — D696 Thrombocytopenia, unspecified: Secondary | ICD-10-CM | POA: Diagnosis not present

## 2023-10-19 DIAGNOSIS — D509 Iron deficiency anemia, unspecified: Secondary | ICD-10-CM | POA: Diagnosis not present

## 2023-10-19 DIAGNOSIS — E785 Hyperlipidemia, unspecified: Secondary | ICD-10-CM | POA: Diagnosis not present

## 2023-10-19 DIAGNOSIS — I872 Venous insufficiency (chronic) (peripheral): Secondary | ICD-10-CM | POA: Diagnosis not present

## 2023-10-19 DIAGNOSIS — I890 Lymphedema, not elsewhere classified: Secondary | ICD-10-CM | POA: Diagnosis not present

## 2023-10-19 DIAGNOSIS — J4489 Other specified chronic obstructive pulmonary disease: Secondary | ICD-10-CM | POA: Diagnosis not present

## 2023-10-19 DIAGNOSIS — L97821 Non-pressure chronic ulcer of other part of left lower leg limited to breakdown of skin: Secondary | ICD-10-CM | POA: Diagnosis not present

## 2023-10-19 DIAGNOSIS — I10 Essential (primary) hypertension: Secondary | ICD-10-CM | POA: Diagnosis not present

## 2023-10-19 DIAGNOSIS — D696 Thrombocytopenia, unspecified: Secondary | ICD-10-CM | POA: Diagnosis not present

## 2023-10-19 DIAGNOSIS — J439 Emphysema, unspecified: Secondary | ICD-10-CM | POA: Diagnosis not present

## 2023-10-23 ENCOUNTER — Telehealth: Payer: Self-pay | Admitting: Internal Medicine

## 2023-10-23 DIAGNOSIS — E785 Hyperlipidemia, unspecified: Secondary | ICD-10-CM | POA: Diagnosis not present

## 2023-10-23 DIAGNOSIS — I10 Essential (primary) hypertension: Secondary | ICD-10-CM | POA: Diagnosis not present

## 2023-10-23 DIAGNOSIS — D509 Iron deficiency anemia, unspecified: Secondary | ICD-10-CM | POA: Diagnosis not present

## 2023-10-23 DIAGNOSIS — I872 Venous insufficiency (chronic) (peripheral): Secondary | ICD-10-CM | POA: Diagnosis not present

## 2023-10-23 DIAGNOSIS — J4489 Other specified chronic obstructive pulmonary disease: Secondary | ICD-10-CM | POA: Diagnosis not present

## 2023-10-23 DIAGNOSIS — I890 Lymphedema, not elsewhere classified: Secondary | ICD-10-CM | POA: Diagnosis not present

## 2023-10-23 DIAGNOSIS — J439 Emphysema, unspecified: Secondary | ICD-10-CM | POA: Diagnosis not present

## 2023-10-23 DIAGNOSIS — D696 Thrombocytopenia, unspecified: Secondary | ICD-10-CM | POA: Diagnosis not present

## 2023-10-23 DIAGNOSIS — L97821 Non-pressure chronic ulcer of other part of left lower leg limited to breakdown of skin: Secondary | ICD-10-CM | POA: Diagnosis not present

## 2023-10-23 MED ORDER — AMLODIPINE BESYLATE 5 MG PO TABS: 5.0000 mg | ORAL_TABLET | Freq: Every day | ORAL | 0 refills | Status: DC

## 2023-10-23 NOTE — Telephone Encounter (Signed)
 Copied from CRM [id number]. Topic: Clinical - Medication Refill >> Oct 23, 2023  2:16 PM Eritrea P wrote: Medication: amLODipine  (NORVASC ) 5 MG tablet  Has the patient contacted their pharmacy? Yes (Agent: If no, request that the patient contact the pharmacy for the refill. If patient does not wish to contact the pharmacy document the reason why and proceed with request.) (Agent: If yes, when and what did the pharmacy advise?)  This is the patient's preferred pharmacy:  [facility] [address] [place] - [address] [place] [address] Phone: [phone] Fax: [phone]  Is this the correct pharmacy for this prescription? Yes If no, delete pharmacy and type the correct one.   Has the prescription been filled recently? No  Is the patient out of the medication? No - Pt does not want to run out   Has the patient been seen for an appointment in the last year OR does the patient have an upcoming appointment? Yes  Can we respond through MyChart? Yes  Agent: Please be advised that Rx refills may take up to 3 business days. We ask that you follow-up with your pharmacy.

## 2023-12-09 ENCOUNTER — Other Ambulatory Visit: Payer: Self-pay | Admitting: Internal Medicine

## 2024-01-14 ENCOUNTER — Telehealth: Payer: Self-pay

## 2024-01-14 NOTE — Telephone Encounter (Signed)
 Copied from CRM #[id number]. Topic: Clinical - Medication Question >> Jan 14, 2024 11:30 AM Frederich PARAS wrote: Reason for CRM: pt calling she says she would like dr. Norleen to prescribe her therahoney gel and if so can he increase the dosage.pt says she is not in any pain.  Pt callback # is [phone]

## 2024-01-15 ENCOUNTER — Telehealth: Payer: Self-pay | Admitting: Radiology

## 2024-01-15 NOTE — Telephone Encounter (Signed)
 Very sorry, I had not heard of this before, so I would not be able to assist    thanks

## 2024-01-15 NOTE — Telephone Encounter (Signed)
 Copied from CRM [id number]. Topic: Clinical - Medical Advice >> Jan 15, 2024 11:08 AM Bethany Lopez wrote: Reason for CRM: Patient called in to ask about the request for rx from provider for therhoney gel. I relayed message to patient from provider that was on patients chart.Patient would like to know if Dr Norleen may know where she may be able to get it from?

## 2024-01-15 NOTE — Telephone Encounter (Signed)
 Very sorry, I would not be able to assist due this not being an FDA approved medication, and normally does not need a script that I know of.  .  thanks

## 2024-01-16 ENCOUNTER — Telehealth: Payer: Self-pay

## 2024-01-16 NOTE — Telephone Encounter (Signed)
 Needs ROV as we cannot order HH more than 90 days after last seen in the office

## 2024-01-16 NOTE — Telephone Encounter (Signed)
 Copied from CRM #[id number]. Topic: General - Other >> Jan 16, 2024 11:34 AM Turkey A wrote: Reason for CRM: Patient ask for Dr.John to start having a nurse come to her home again.

## 2024-01-17 NOTE — Telephone Encounter (Signed)
 Copied from CRM [id number]. Topic: General - Other >> Jan 17, 2024  3:04 PM Dedra B wrote: Reason for CRM: Pt called to follow up on request for home health nurse. Pls call pt.

## 2024-01-21 ENCOUNTER — Telehealth: Payer: Self-pay

## 2024-01-21 NOTE — Telephone Encounter (Signed)
 Copied from CRM [id number]. Topic: General - Other >> Jan 18, 2024  4:46 PM Dedra B wrote: Reason for CRM: Pt calling to follow up request for home health nurse. Pt said she has an appt on 10/15 but needs a home health nurse to come in now. Pls call pt.

## 2024-01-22 NOTE — Telephone Encounter (Signed)
 Called and spoke with Pt explaining she would need to be seen before a nurse could be sent out & Pt hung up the phone.

## 2024-01-30 ENCOUNTER — Encounter: Payer: Self-pay | Admitting: Internal Medicine

## 2024-01-30 ENCOUNTER — Ambulatory Visit: Payer: Self-pay | Admitting: Internal Medicine

## 2024-01-30 ENCOUNTER — Ambulatory Visit: Admitting: Internal Medicine

## 2024-01-30 VITALS — BP 130/82 | HR 94 | Temp 98.5°F | Ht 65.0 in | Wt 261.0 lb

## 2024-01-30 DIAGNOSIS — J3089 Other allergic rhinitis: Secondary | ICD-10-CM

## 2024-01-30 DIAGNOSIS — E7800 Pure hypercholesterolemia, unspecified: Secondary | ICD-10-CM

## 2024-01-30 DIAGNOSIS — S81802A Unspecified open wound, left lower leg, initial encounter: Secondary | ICD-10-CM | POA: Diagnosis not present

## 2024-01-30 DIAGNOSIS — S81801A Unspecified open wound, right lower leg, initial encounter: Secondary | ICD-10-CM

## 2024-01-30 DIAGNOSIS — Z23 Encounter for immunization: Secondary | ICD-10-CM | POA: Diagnosis not present

## 2024-01-30 DIAGNOSIS — J309 Allergic rhinitis, unspecified: Secondary | ICD-10-CM | POA: Diagnosis not present

## 2024-01-30 DIAGNOSIS — R739 Hyperglycemia, unspecified: Secondary | ICD-10-CM | POA: Diagnosis not present

## 2024-01-30 DIAGNOSIS — L03115 Cellulitis of right lower limb: Secondary | ICD-10-CM

## 2024-01-30 DIAGNOSIS — T7840XA Allergy, unspecified, initial encounter: Secondary | ICD-10-CM

## 2024-01-30 DIAGNOSIS — I10 Essential (primary) hypertension: Secondary | ICD-10-CM

## 2024-01-30 DIAGNOSIS — Z720 Tobacco use: Secondary | ICD-10-CM

## 2024-01-30 DIAGNOSIS — N289 Disorder of kidney and ureter, unspecified: Secondary | ICD-10-CM

## 2024-01-30 LAB — CBC WITH DIFFERENTIAL/PLATELET
Basophils Absolute: 0.1 K/uL (ref 0.0–0.1)
Basophils Relative: 0.8 % (ref 0.0–3.0)
Eosinophils Absolute: 0.3 K/uL (ref 0.0–0.7)
Eosinophils Relative: 3.9 % (ref 0.0–5.0)
HCT: 40.8 % (ref 36.0–46.0)
Hemoglobin: 12.9 g/dL (ref 12.0–15.0)
Lymphocytes Relative: 24.1 % (ref 12.0–46.0)
Lymphs Abs: 1.5 K/uL (ref 0.7–4.0)
MCHC: 31.7 g/dL (ref 30.0–36.0)
MCV: 80.2 fl (ref 78.0–100.0)
Monocytes Absolute: 0.5 K/uL (ref 0.1–1.0)
Monocytes Relative: 8.2 % (ref 3.0–12.0)
Neutro Abs: 4 K/uL (ref 1.4–7.7)
Neutrophils Relative %: 63 % (ref 43.0–77.0)
Platelets: 168 K/uL (ref 150.0–400.0)
RBC: 5.09 Mil/uL (ref 3.87–5.11)
RDW: 17.2 % — ABNORMAL HIGH (ref 11.5–15.5)
WBC: 6.4 K/uL (ref 4.0–10.5)

## 2024-01-30 LAB — BASIC METABOLIC PANEL WITH GFR
BUN: 26 mg/dL — ABNORMAL HIGH (ref 6–23)
CO2: 24 meq/L (ref 19–32)
Calcium: 9.3 mg/dL (ref 8.4–10.5)
Chloride: 102 meq/L (ref 96–112)
Creatinine, Ser: 1.14 mg/dL (ref 0.40–1.20)
GFR: 47.23 mL/min — ABNORMAL LOW (ref >60.00)
Glucose, Bld: 83 mg/dL (ref 70–99)
Potassium: 4.3 meq/L (ref 3.5–5.1)
Sodium: 135 meq/L (ref 135–145)

## 2024-01-30 LAB — HEPATIC FUNCTION PANEL
ALT: 25 U/L (ref 0–35)
AST: 29 U/L (ref 0–37)
Albumin: 3.7 g/dL (ref 3.5–5.2)
Alkaline Phosphatase: 60 U/L (ref 39–117)
Bilirubin, Direct: 0.3 mg/dL (ref 0.0–0.3)
Total Bilirubin: 0.6 mg/dL (ref 0.2–1.2)
Total Protein: 8 g/dL (ref 6.0–8.3)

## 2024-01-30 LAB — LIPID PANEL
Cholesterol: 128 mg/dL (ref 0–200)
HDL: 41.2 mg/dL (ref >39.00)
LDL Cholesterol: 56 mg/dL (ref 0–99)
NonHDL: 86.77
Total CHOL/HDL Ratio: 3
Triglycerides: 156 mg/dL — ABNORMAL HIGH (ref 0.0–149.0)
VLDL: 31.2 mg/dL (ref 0.0–40.0)

## 2024-01-30 LAB — HEMOGLOBIN A1C: Hgb A1c MFr Bld: 6.1 % (ref 4.6–6.5)

## 2024-01-30 MED ORDER — DOXYCYCLINE HYCLATE 100 MG PO TABS: 100.0000 mg | ORAL_TABLET | Freq: Two times a day (BID) | ORAL | 0 refills | Status: DC

## 2024-01-30 MED ORDER — METHYLPREDNISOLONE ACETATE 80 MG/ML IJ SUSP
80.0000 mg | Freq: Once | INTRAMUSCULAR | Status: AC
  Administered 2024-01-30: 80 mg via INTRAMUSCULAR

## 2024-01-30 MED ORDER — CVS MANUKA HONEY WOUND EX GEL: CUTANEOUS | 11 refills | Status: DC

## 2024-01-30 NOTE — Assessment & Plan Note (Signed)
 Lab Results  Component Value Date   CREATININE 1.14 01/30/2024   Stable overall, cont to avoid nephrotoxins

## 2024-01-30 NOTE — Patient Instructions (Addendum)
 You had the steroid shot today, and flu shot  Please take all new medication as prescribed  - the antibiotic, and honey gel as directed  Please continue all other medications as before, and refills have been done if requested.  Please have the pharmacy call with any other refills you may need.  Please continue your efforts at being more active, low cholesterol diet, and weight control.  Please keep your appointments with your specialists as you may have planned  You will be contacted regarding the referral for: Home Health for wound nurse   Please quit smoking  Please go to the LAB at the blood drawing area for the tests to be done  You will be contacted by phone if any changes need to be made immediately.  Otherwise, you will receive a letter about your results with an explanation, but please check with MyChart first.  Please make an Appointment to return in 6 months, or sooner if needed

## 2024-01-30 NOTE — Progress Notes (Signed)
 Patient ID: Bethany Lopez, female   DOB: [birth year], 73 y.o.   MRN: [id number]        Chief Complaint: follow up bilat venous ulcers, right leg cellulitis, smoker, allergic rhinitis       HPI:  Bethany Lopez is a 72 y.o. female here with c/o persistent 2 small venous ulcers to right leg but now with red tender area starting with swelling.  Also has small ulcer to left anterior ankle area.  Still smoking, not ready to quit.  Denies worsening pain on ambulation.  Pt denies chest pain, increased sob or doe, wheezing, orthopnea, PND, palpitations, dizziness or syncope.  Pt asking for [facility] wound nurse, here with walker today for safety though does not use much at home.  No recent falls or trauma.  Declines referral outpt wound clinic.  Does have several wks ongoing nasal allergy symptoms with clearish congestion, itch and sneezing, without fever, pain, ST, cough, swelling or wheezing. Due for flu shot       Wt Readings from Last 3 Encounters:  01/30/24 261 lb (118.4 kg)  07/26/23 268 lb 6.4 oz (121.7 kg)  07/18/23 245 lb (111.1 kg)   BP Readings from Last 3 Encounters:  01/30/24 130/82  07/26/23 132/84  07/21/23 131/62         Past Medical History:  Diagnosis Date   Allergic rhinitis, cause unspecified 05/01/2012   Asthma    Carpal tunnel syndrome 06/17/2014   GERD (gastroesophageal reflux disease) 02/11/2015   Hyperlipidemia 05/30/2011   NEC/NOS     Hypertension    Obesity    Thrombocytopenia 08/12/2015   Tobacco abuse    Past Surgical History:  Procedure Laterality Date   arthroscopic right knee     5-6 yrs ago   TUBAL LIGATION      reports that she has been smoking cigarettes. She has never used smokeless tobacco. She reports current alcohol use. She reports that she does not use drugs. family history includes Diabetes in her brother, mother, and sister; Heart disease in her father; Kidney disease in her mother. Allergies  Allergen Reactions   Lodine [Etodolac]  Other (See Comments)    GI upset   Penicillins    Current Outpatient Medications on File Prior to Visit  Medication Sig Dispense Refill   acetaminophen  (TYLENOL ) 500 MG tablet Take 1-2 tablets by mouth every 6 (six) hours as needed.     albuterol  (PROVENTIL  HFA;VENTOLIN  HFA) 108 (90 Base) MCG/ACT inhaler Inhale 2 puffs into the lungs every 6 (six) hours as needed for wheezing or shortness of breath. 1 Inhaler 11   ALPRAZolam  (XANAX ) 0.25 MG tablet Take 1 tablet by mouth twice daily as needed for anxiety 60 tablet 2   amLODipine  (NORVASC ) 5 MG tablet Take 1 tablet (5 mg total) by mouth daily. 90 tablet 0   aspirin  81 MG EC tablet Take by mouth.     atorvastatin  (LIPITOR) 40 MG tablet Take 1 tablet (40 mg total) by mouth daily. 90 tablet 3   B Complex-C (SUPER B COMPLEX PO) Take by mouth.     FLOVENT  HFA 110 MCG/ACT inhaler Inhale 2 puffs into the lungs 2 (two) times daily. (Patient taking differently: Inhale 2 puffs into the lungs 2 (two) times daily as needed.) 1 each 0   fluticasone  (FLONASE ) 50 MCG/ACT nasal spray Place 2 sprays into both nostrils daily. (Patient taking differently: Place 2 sprays into both nostrils daily as needed for allergies or rhinitis.) 45 g 3  losartan  (COZAAR ) 50 MG tablet Take 1 tablet (50 mg total) by mouth daily. 90 tablet 3   meloxicam  (MOBIC ) 15 MG tablet TAKE 1 TABLET BY MOUTH ONCE DAILY AS NEEDED FOR PAIN 90 tablet 1   Multiple Vitamin (MULTIVITAMIN) tablet Take 1 tablet by mouth daily.     Omega-3 Fatty Acids (FISH OIL) 1000 MG CAPS Take 1 capsule by mouth daily.     triamcinolone  (NASACORT  ALLERGY 24HR) 55 MCG/ACT AERO nasal inhaler Place 2 sprays into the nose daily.     cetirizine  (ZYRTEC ) 10 MG tablet Take 1 tablet (10 mg total) by mouth daily. (Patient taking differently: Take 10 mg by mouth daily as needed for allergies.) 90 tablet 3   No current facility-administered medications on file prior to visit.        ROS:  All others reviewed and  negative.  Objective        PE:  BP 130/82 (BP Location: Right Arm, Patient Position: Sitting, Cuff Size: Normal)   Pulse 94   Temp 98.5 F (36.9 C) (Oral)   Ht 5' 5 (1.651 m)   Wt 261 lb (118.4 kg)   SpO2 92%   BMI 43.43 kg/m                 Constitutional: Pt appears in NAD               HENT: Head: NCAT.                Right Ear: External ear normal.                 Left Ear: External ear normal.                Eyes: . Pupils are equal, round, and reactive to light. Conjunctivae and EOM are normal               Nose: without d/c or deformity               Neck: Neck supple. Gross normal ROM               Cardiovascular: Normal rate and regular rhythm.                 Pulmonary/Chest: Effort normal and breath sounds without rales or wheezing.                Abd:  Soft, NT, ND, + BS, no organomegaly               Neurological: Pt is alert. At baseline orientation, motor grossly intact               Skin: Skin is warm. No rashes, no other new lesions, LE edema - trace to 1+ right with redness to mid lateral calf area approx 4 cm with more distal left lateral lower leg small ulceration and right mid dorsal foot as well               Psychiatric: Pt behavior is normal without agitation   Micro: none  Cardiac tracings I have personally interpreted today:  none  Pertinent Radiological findings (summarize): none   Lab Results  Component Value Date   WBC 6.4 01/30/2024   HGB 12.9 01/30/2024   HCT 40.8 01/30/2024   PLT 168.0 01/30/2024   GLUCOSE 83 01/30/2024   CHOL 128 01/30/2024   TRIG 156.0 (H) 01/30/2024   HDL 41.20 01/30/2024   LDLDIRECT 74.0 06/13/2018  LDLCALC 56 01/30/2024   ALT 25 01/30/2024   AST 29 01/30/2024   NA 135 01/30/2024   K 4.3 01/30/2024   CL 102 01/30/2024   CREATININE 1.14 01/30/2024   BUN 26 (H) 01/30/2024   CO2 24 01/30/2024   TSH 2.98 04/24/2023   INR 1.1 11/12/2012   HGBA1C 6.1 01/30/2024   Assessment/Plan:  Bethany Lopez is a  73 y.o. Black or African American [2] female with  has a past medical history of Allergic rhinitis, cause unspecified (05/01/2012), Asthma, Carpal tunnel syndrome (06/17/2014), GERD (gastroesophageal reflux disease) (02/11/2015), Hyperlipidemia (05/30/2011), Hypertension, Obesity, Thrombocytopenia (08/12/2015), and Tobacco abuse.  Allergic rhinitis mod, for depomedrol 80 mg IM, continue allegra  otc and add nasacort  asd,  to f/u any worsening symptoms or concerns  Hyperglycemia Lab Results  Component Value Date   HGBA1C 6.1 01/30/2024   Stable, pt to continue current medical treatment  - diet, wt control   Hyperlipidemia Lab Results  Component Value Date   LDLCALC 56 01/30/2024   Stable, pt to continue current statin lipitor 40 mg qd   Hypertension BP Readings from Last 3 Encounters:  01/30/24 130/82  07/26/23 132/84  07/21/23 131/62   Stable, pt to continue medical treatment norvasc  5 every day, losartan  50 mg qd   Renal insufficiency Lab Results  Component Value Date   CREATININE 1.14 01/30/2024   Stable overall, cont to avoid nephrotoxins   Tobacco use Pt counsled to quit, pt not ready  Cellulitis of right leg Early mild relatively small area but will need doxycycline 100 bid course  Wound of left leg No evidence for cellulitis, but will need HH wound nurse and honey gel rx for topical asd, pt declines wound clinic or ortho referral for now  Wound of right leg Mild x 2 small areas, will need HH wound nurse and honey gel rx for topical asd, pt declines wound clinic or ortho referral for now  Followup: Return in about 6 months (around 07/30/2024).  Lynwood Rush, MD 01/30/2024 8:15 PM [facility]

## 2024-01-30 NOTE — Assessment & Plan Note (Signed)
 BP Readings from Last 3 Encounters:  01/30/24 130/82  07/26/23 132/84  07/21/23 131/62   Stable, pt to continue medical treatment norvasc  5 every day, losartan  50 mg qd

## 2024-01-30 NOTE — Assessment & Plan Note (Signed)
 Mild x 2 small areas, will need HH wound nurse and honey gel rx for topical asd, pt declines wound clinic or ortho referral for now

## 2024-01-30 NOTE — Assessment & Plan Note (Signed)
 No evidence for cellulitis, but will need HH wound nurse and honey gel rx for topical asd, pt declines wound clinic or ortho referral for now

## 2024-01-30 NOTE — Assessment & Plan Note (Addendum)
 Early mild relatively small area but will need doxycycline 100 bid course

## 2024-01-30 NOTE — Assessment & Plan Note (Signed)
 Lab Results  Component Value Date   LDLCALC 56 01/30/2024   Stable, pt to continue current statin lipitor 40 mg qd

## 2024-01-30 NOTE — Assessment & Plan Note (Signed)
 mod, for depomedrol 80 mg IM, continue allegra  otc and add nasacort  asd,  to f/u any worsening symptoms or concerns

## 2024-01-30 NOTE — Assessment & Plan Note (Signed)
 Lab Results  Component Value Date   HGBA1C 6.1 01/30/2024   Stable, pt to continue current medical treatment  - diet, wt control

## 2024-01-30 NOTE — Assessment & Plan Note (Signed)
 Pt counsled to quit, pt not ready

## 2024-01-31 NOTE — Addendum Note (Signed)
 Addended by: NORLEEN LYNWOOD ORN on: 01/31/2024 01:52 PM   Modules accepted: Orders

## 2024-02-04 DIAGNOSIS — D649 Anemia, unspecified: Secondary | ICD-10-CM | POA: Diagnosis not present

## 2024-02-04 DIAGNOSIS — I890 Lymphedema, not elsewhere classified: Secondary | ICD-10-CM | POA: Diagnosis not present

## 2024-02-04 DIAGNOSIS — L03115 Cellulitis of right lower limb: Secondary | ICD-10-CM | POA: Diagnosis not present

## 2024-02-04 DIAGNOSIS — L97821 Non-pressure chronic ulcer of other part of left lower leg limited to breakdown of skin: Secondary | ICD-10-CM | POA: Diagnosis not present

## 2024-02-04 DIAGNOSIS — I872 Venous insufficiency (chronic) (peripheral): Secondary | ICD-10-CM | POA: Diagnosis not present

## 2024-02-04 DIAGNOSIS — F32A Depression, unspecified: Secondary | ICD-10-CM | POA: Diagnosis not present

## 2024-02-04 DIAGNOSIS — F419 Anxiety disorder, unspecified: Secondary | ICD-10-CM | POA: Diagnosis not present

## 2024-02-04 DIAGNOSIS — L97811 Non-pressure chronic ulcer of other part of right lower leg limited to breakdown of skin: Secondary | ICD-10-CM | POA: Diagnosis not present

## 2024-02-04 DIAGNOSIS — J45909 Unspecified asthma, uncomplicated: Secondary | ICD-10-CM | POA: Diagnosis not present

## 2024-02-05 ENCOUNTER — Telehealth: Payer: Self-pay

## 2024-02-05 NOTE — Telephone Encounter (Signed)
 Copied from CRM #[id number]. Topic: Clinical - Lab/Test Results >> Feb 05, 2024 11:20 AM Vena HERO wrote: Reason for CRM: pt called requesting call back to go over lab results. [phone]

## 2024-02-05 NOTE — Telephone Encounter (Unsigned)
 Copied from CRM [id number]. Topic: Clinical - Lab/Test Results >> Feb 05, 2024  4:41 PM Ashley R wrote: Reason for CRM: Did not get call today about going over test results. Callback [phone] (H)

## 2024-02-05 NOTE — Telephone Encounter (Signed)
 Called and unable to leave voicemail

## 2024-02-06 NOTE — Telephone Encounter (Signed)
Spoke with Pt about lab results.

## 2024-02-08 ENCOUNTER — Telehealth: Payer: Self-pay | Admitting: Internal Medicine

## 2024-02-08 DIAGNOSIS — F32A Depression, unspecified: Secondary | ICD-10-CM | POA: Diagnosis not present

## 2024-02-08 DIAGNOSIS — D649 Anemia, unspecified: Secondary | ICD-10-CM | POA: Diagnosis not present

## 2024-02-08 DIAGNOSIS — F419 Anxiety disorder, unspecified: Secondary | ICD-10-CM | POA: Diagnosis not present

## 2024-02-08 DIAGNOSIS — J45909 Unspecified asthma, uncomplicated: Secondary | ICD-10-CM | POA: Diagnosis not present

## 2024-02-08 DIAGNOSIS — L03115 Cellulitis of right lower limb: Secondary | ICD-10-CM | POA: Diagnosis not present

## 2024-02-08 DIAGNOSIS — L97821 Non-pressure chronic ulcer of other part of left lower leg limited to breakdown of skin: Secondary | ICD-10-CM | POA: Diagnosis not present

## 2024-02-08 DIAGNOSIS — I872 Venous insufficiency (chronic) (peripheral): Secondary | ICD-10-CM | POA: Diagnosis not present

## 2024-02-08 DIAGNOSIS — L97811 Non-pressure chronic ulcer of other part of right lower leg limited to breakdown of skin: Secondary | ICD-10-CM | POA: Diagnosis not present

## 2024-02-08 DIAGNOSIS — I890 Lymphedema, not elsewhere classified: Secondary | ICD-10-CM | POA: Diagnosis not present

## 2024-02-08 NOTE — Telephone Encounter (Signed)
 Copied from CRM [id number]. Topic: Clinical - Prescription Issue >> Feb 08, 2024  1:16 PM Armenia J wrote: Reason for CRM: Lorenza calling from [facility] is calling to let us  know that the Wound Dressings ([facility]) GEL is on back order and they do not have a alternative gel to use for the patient's wound.  She is wondering if Dr. Norleen could urgently send in Wound Dressings ([facility]OUND) GEL to the patient's pharmacy.  [facility] [address], [place] - [address] [place] [address] Phone: [phone] Fax: [phone] Hours: Not open 24 hours  Please call Tasha back once there is an update.

## 2024-02-11 ENCOUNTER — Telehealth: Payer: Self-pay | Admitting: Internal Medicine

## 2024-02-11 NOTE — Telephone Encounter (Signed)
 Pt called in to see if Dr Norleen received her msg yet and if there was a status update. I let her know he's been with pts today will someone will call soon.

## 2024-02-11 NOTE — Telephone Encounter (Unsigned)
 Copied from CRM [id number]. Topic: Clinical - Prescription Issue >> Feb 11, 2024 10:26 AM Amy B wrote: Reason for CRM: Patient states she is out of her wound care gel.  [facility].  The only place she can find it is through her DME supplier, [facility].  She requests this gel to be ordered through them.  Please call patient with any questions [phone]

## 2024-02-11 NOTE — Telephone Encounter (Signed)
 Ok for verbal if [facility] will take this, but otherwise I think this requires a hardcopy order .   I am out of the office and not able to do this.  Pt may need covering Provider at office to do this.   Thanks!

## 2024-02-12 ENCOUNTER — Telehealth: Payer: Self-pay

## 2024-02-12 DIAGNOSIS — D649 Anemia, unspecified: Secondary | ICD-10-CM | POA: Diagnosis not present

## 2024-02-12 DIAGNOSIS — I890 Lymphedema, not elsewhere classified: Secondary | ICD-10-CM | POA: Diagnosis not present

## 2024-02-12 DIAGNOSIS — F32A Depression, unspecified: Secondary | ICD-10-CM | POA: Diagnosis not present

## 2024-02-12 DIAGNOSIS — L97821 Non-pressure chronic ulcer of other part of left lower leg limited to breakdown of skin: Secondary | ICD-10-CM | POA: Diagnosis not present

## 2024-02-12 DIAGNOSIS — L97811 Non-pressure chronic ulcer of other part of right lower leg limited to breakdown of skin: Secondary | ICD-10-CM | POA: Diagnosis not present

## 2024-02-12 DIAGNOSIS — J45909 Unspecified asthma, uncomplicated: Secondary | ICD-10-CM | POA: Diagnosis not present

## 2024-02-12 DIAGNOSIS — L03115 Cellulitis of right lower limb: Secondary | ICD-10-CM | POA: Diagnosis not present

## 2024-02-12 DIAGNOSIS — F419 Anxiety disorder, unspecified: Secondary | ICD-10-CM | POA: Diagnosis not present

## 2024-02-12 DIAGNOSIS — I872 Venous insufficiency (chronic) (peripheral): Secondary | ICD-10-CM | POA: Diagnosis not present

## 2024-02-12 MED ORDER — CVS MANUKA HONEY WOUND EX GEL: CUTANEOUS | 11 refills | Status: AC

## 2024-02-12 NOTE — Telephone Encounter (Signed)
This is being addressed in another phone note.

## 2024-02-12 NOTE — Telephone Encounter (Signed)
 Rx printed

## 2024-02-12 NOTE — Telephone Encounter (Signed)
 Copied from CRM [id number]. Topic: Clinical - Prescription Issue >> Feb 12, 2024 12:48 PM Bethany Lopez wrote: Reason for CRM: Patient inquiring about the Wound Dressings ([facility] HONEY WOUND) GEL [[id number]] She was inquiring if we have sent over an order to [facility] for this Wound Gel.

## 2024-02-12 NOTE — Telephone Encounter (Unsigned)
 Copied from CRM [id number]. Topic: Clinical - Prescription Issue >> Feb 12, 2024 12:48 PM Zy'onna H wrote: Reason for CRM: Patient inquiring about the Wound Dressings ([facility] HONE[facility] WOUND) GEL [[id number]] She was inquiring if we have sent over an order to [facility] for this Wound Gel.  I informed the patient per last encounter with Norleen Lynwood ORN, MD - -he gave the go ahead to proceed with the verbal order to [facility] the fulfill this Rx.   I spoke further about this issue with Sarah from the [facility]: - She advised me the patient should call [facility] to confirm they've received the order for: Wound Dressings ([facility] HONEY WOUND) GEL [[id number]]  And if NO - Call [facility] and the Nurse Manager will handle the request. >> Feb 12, 2024  1:06 PM China J wrote: The patient is calling to provide [facility]'s fax number for a hard copy of approval. Dr. Norleen has given the verbal approval and said that a different provider will have to complete this process. The patient is needing a call from the medical assistant once this has been faxed over to [facility].

## 2024-02-13 NOTE — Telephone Encounter (Signed)
 The Rx has been faxed to [facility] today.

## 2024-02-14 ENCOUNTER — Telehealth: Payer: Self-pay

## 2024-02-14 ENCOUNTER — Other Ambulatory Visit: Payer: Self-pay | Admitting: Internal Medicine

## 2024-02-14 NOTE — Telephone Encounter (Signed)
 Copied from CRM #[id number]. Topic: Clinical - Prescription Issue >> Feb 14, 2024 10:36 AM Bethany Lopez wrote: Reason for CRM: Pt calling to let us  know she will be out of her blood pressure medication today and she requested it to be refilled on Monday. I only see that it was requested today but she insists she called to get a refill Monday. She says she needs it today or she will be in trouble because she is out and is very upset a refill request wasn't placed earlier. [facility]  [facility] [address] [place] - [address] [place] [address] Phone: [phone] Fax: [phone]

## 2024-02-14 NOTE — Telephone Encounter (Signed)
 Refill has been sent.

## 2024-02-15 DIAGNOSIS — F32A Depression, unspecified: Secondary | ICD-10-CM | POA: Diagnosis not present

## 2024-02-15 DIAGNOSIS — D649 Anemia, unspecified: Secondary | ICD-10-CM | POA: Diagnosis not present

## 2024-02-15 DIAGNOSIS — F419 Anxiety disorder, unspecified: Secondary | ICD-10-CM | POA: Diagnosis not present

## 2024-02-15 DIAGNOSIS — J45909 Unspecified asthma, uncomplicated: Secondary | ICD-10-CM | POA: Diagnosis not present

## 2024-02-15 DIAGNOSIS — L97821 Non-pressure chronic ulcer of other part of left lower leg limited to breakdown of skin: Secondary | ICD-10-CM | POA: Diagnosis not present

## 2024-02-15 DIAGNOSIS — I872 Venous insufficiency (chronic) (peripheral): Secondary | ICD-10-CM | POA: Diagnosis not present

## 2024-02-15 DIAGNOSIS — I890 Lymphedema, not elsewhere classified: Secondary | ICD-10-CM | POA: Diagnosis not present

## 2024-02-15 DIAGNOSIS — L03115 Cellulitis of right lower limb: Secondary | ICD-10-CM | POA: Diagnosis not present

## 2024-02-15 DIAGNOSIS — L97811 Non-pressure chronic ulcer of other part of right lower leg limited to breakdown of skin: Secondary | ICD-10-CM | POA: Diagnosis not present

## 2024-02-18 ENCOUNTER — Telehealth: Payer: Self-pay

## 2024-02-18 NOTE — Telephone Encounter (Signed)
 Copied from CRM #[id number]. Topic: General - Other >> Feb 18, 2024  3:50 PM Thersia C wrote: Reason for CRM: Patient called in stated the fax number for [facility] is not the correct number not a working number need the prescription resent to two fax numbers below    Wound Dressings ([facility]OUND) GEL [phone]

## 2024-02-19 DIAGNOSIS — I890 Lymphedema, not elsewhere classified: Secondary | ICD-10-CM | POA: Diagnosis not present

## 2024-02-19 DIAGNOSIS — J45909 Unspecified asthma, uncomplicated: Secondary | ICD-10-CM | POA: Diagnosis not present

## 2024-02-19 DIAGNOSIS — F419 Anxiety disorder, unspecified: Secondary | ICD-10-CM | POA: Diagnosis not present

## 2024-02-19 DIAGNOSIS — L03115 Cellulitis of right lower limb: Secondary | ICD-10-CM | POA: Diagnosis not present

## 2024-02-19 DIAGNOSIS — D649 Anemia, unspecified: Secondary | ICD-10-CM | POA: Diagnosis not present

## 2024-02-19 DIAGNOSIS — I872 Venous insufficiency (chronic) (peripheral): Secondary | ICD-10-CM | POA: Diagnosis not present

## 2024-02-19 DIAGNOSIS — F32A Depression, unspecified: Secondary | ICD-10-CM | POA: Diagnosis not present

## 2024-02-19 DIAGNOSIS — L97811 Non-pressure chronic ulcer of other part of right lower leg limited to breakdown of skin: Secondary | ICD-10-CM | POA: Diagnosis not present

## 2024-02-19 DIAGNOSIS — L97821 Non-pressure chronic ulcer of other part of left lower leg limited to breakdown of skin: Secondary | ICD-10-CM | POA: Diagnosis not present

## 2024-02-19 NOTE — Telephone Encounter (Signed)
 Rx has been sent to both fax numbers provided below.

## 2024-02-20 NOTE — Telephone Encounter (Signed)
 Patient called to check on the status of prescriptions being sent to two additional fax number provided. Patient advised that information has been sent.

## 2024-02-22 DIAGNOSIS — F419 Anxiety disorder, unspecified: Secondary | ICD-10-CM | POA: Diagnosis not present

## 2024-02-22 DIAGNOSIS — L97821 Non-pressure chronic ulcer of other part of left lower leg limited to breakdown of skin: Secondary | ICD-10-CM | POA: Diagnosis not present

## 2024-02-22 DIAGNOSIS — I890 Lymphedema, not elsewhere classified: Secondary | ICD-10-CM | POA: Diagnosis not present

## 2024-02-22 DIAGNOSIS — J45909 Unspecified asthma, uncomplicated: Secondary | ICD-10-CM | POA: Diagnosis not present

## 2024-02-22 DIAGNOSIS — D649 Anemia, unspecified: Secondary | ICD-10-CM | POA: Diagnosis not present

## 2024-02-22 DIAGNOSIS — I872 Venous insufficiency (chronic) (peripheral): Secondary | ICD-10-CM | POA: Diagnosis not present

## 2024-02-22 DIAGNOSIS — L03115 Cellulitis of right lower limb: Secondary | ICD-10-CM | POA: Diagnosis not present

## 2024-02-22 DIAGNOSIS — L97811 Non-pressure chronic ulcer of other part of right lower leg limited to breakdown of skin: Secondary | ICD-10-CM | POA: Diagnosis not present

## 2024-02-22 DIAGNOSIS — F32A Depression, unspecified: Secondary | ICD-10-CM | POA: Diagnosis not present

## 2024-02-26 ENCOUNTER — Ambulatory Visit: Payer: Self-pay

## 2024-02-26 ENCOUNTER — Telehealth: Payer: Self-pay | Admitting: Internal Medicine

## 2024-02-26 ENCOUNTER — Other Ambulatory Visit: Payer: Self-pay | Admitting: Internal Medicine

## 2024-02-26 DIAGNOSIS — D649 Anemia, unspecified: Secondary | ICD-10-CM | POA: Diagnosis not present

## 2024-02-26 DIAGNOSIS — L97811 Non-pressure chronic ulcer of other part of right lower leg limited to breakdown of skin: Secondary | ICD-10-CM | POA: Diagnosis not present

## 2024-02-26 DIAGNOSIS — I872 Venous insufficiency (chronic) (peripheral): Secondary | ICD-10-CM | POA: Diagnosis not present

## 2024-02-26 DIAGNOSIS — L03115 Cellulitis of right lower limb: Secondary | ICD-10-CM | POA: Diagnosis not present

## 2024-02-26 DIAGNOSIS — F419 Anxiety disorder, unspecified: Secondary | ICD-10-CM | POA: Diagnosis not present

## 2024-02-26 DIAGNOSIS — L97821 Non-pressure chronic ulcer of other part of left lower leg limited to breakdown of skin: Secondary | ICD-10-CM | POA: Diagnosis not present

## 2024-02-26 DIAGNOSIS — J45909 Unspecified asthma, uncomplicated: Secondary | ICD-10-CM | POA: Diagnosis not present

## 2024-02-26 DIAGNOSIS — F32A Depression, unspecified: Secondary | ICD-10-CM | POA: Diagnosis not present

## 2024-02-26 DIAGNOSIS — I890 Lymphedema, not elsewhere classified: Secondary | ICD-10-CM | POA: Diagnosis not present

## 2024-02-26 MED ORDER — DOXYCYCLINE HYCLATE 100 MG PO TABS: 100.0000 mg | ORAL_TABLET | Freq: Two times a day (BID) | ORAL | 0 refills | Status: AC

## 2024-02-26 NOTE — Telephone Encounter (Signed)
 FYI Only or Action Required?: Action required by provider: medication refill request.  Patient was last seen in primary care on 01/30/2024 by Norleen Lynwood ORN, MD.  Called Nurse Triage reporting Medication Refill.   Triage Disposition: Call PCP When Office is Open  Patient/caregiver understands and will follow disposition?: Yes      Reason for Disposition  [1] Prescription refill request for NON-ESSENTIAL medicine (i.e., no harm to patient if med not taken) AND [2] triager unable to refill per department policy  Answer Assessment - Initial Assessment Questions Patient has declined an appointment and is requesting a refill for her Doxycycline which was prescribed for the wounds on her leg. Please advise.      1. DRUG NAME: What medicine do you need to have refilled?     doxycycline (VIBRA-TABS) 100 MG tablet 2. REFILLS REMAINING: How many refills are remaining? Notes: The label on the medicine or pill bottle will show how many refills are remaining. If there are no refills remaining, then a renewal may be needed.     0 4. PRESCRIBER: Who prescribed it? Note: The prescribing doctor or group is responsible for refill approvals..     Dr. Norleen  Protocols used: Medication Refill and Renewal Call-A-AH

## 2024-02-26 NOTE — Telephone Encounter (Signed)
 Ok the refill for doxy course will be done thanks

## 2024-02-26 NOTE — Telephone Encounter (Signed)
 This RN made 1st attempt to reach patient regarding symptoms for refill of abx. Left message with office call back number.   Copied from CRM [id number]. Topic: Clinical - Medication Refill >> Feb 26, 2024 12:57 PM Burnard DEL wrote: Medication: doxycycline (VIBRA-TABS) 100 MG tablet   Has the patient contacted their pharmacy? No (Agent: If no, request that the patient contact the pharmacy for the refill. If patient does not wish to contact the pharmacy document the reason why and proceed with request.) (Agent: If yes, when and what did the pharmacy advise?)   This is the patient's preferred pharmacy:  [facility] [address], [place] - [address] [place] [address] Phone: [phone] Fax: [phone]   Is this the correct pharmacy for this prescription? Yes If no, delete pharmacy and type the correct one.    Has the prescription been filled recently? No   Is the patient out of the medication? Yes   Has the patient been seen for an appointment in the last year OR does the patient have an upcoming appointment? Yes   Can we respond through MyChart? Yes   Agent: Please be advised that Rx refills may take up to 3 business days. We ask that you follow-up with your pharmacy.

## 2024-02-26 NOTE — Telephone Encounter (Unsigned)
 Copied from CRM [id number]. Topic: Clinical - Medication Refill >> Feb 26, 2024 12:57 PM Burnard DEL wrote: Medication: doxycycline (VIBRA-TABS) 100 MG tablet  Has the patient contacted their pharmacy? No (Agent: If no, request that the patient contact the pharmacy for the refill. If patient does not wish to contact the pharmacy document the reason why and proceed with request.) (Agent: If yes, when and what did the pharmacy advise?)  This is the patient's preferred pharmacy:  [facility] [address], [place] - [address] [place] [address] Phone: [phone] Fax: [phone]  Is this the correct pharmacy for this prescription? Yes If no, delete pharmacy and type the correct one.   Has the prescription been filled recently? No  Is the patient out of the medication? Yes  Has the patient been seen for an appointment in the last year OR does the patient have an upcoming appointment? Yes  Can we respond through MyChart? Yes  Agent: Please be advised that Rx refills may take up to 3 business days. We ask that you follow-up with your pharmacy.

## 2024-02-27 ENCOUNTER — Telehealth: Payer: Self-pay

## 2024-02-27 NOTE — Telephone Encounter (Signed)
Called and spoke with Pt

## 2024-02-27 NOTE — Telephone Encounter (Signed)
 Copied from CRM [id number]. Topic: Clinical - Medical Advice >> Feb 26, 2024  4:09 PM Alfonso ORN wrote: Reason for CRM: patient calling on the status of John,James W,MD receiving the fax [facility] faxed over paperwork to fill a wound prescription for patient on 02/21/24

## 2024-02-29 ENCOUNTER — Telehealth: Payer: Self-pay

## 2024-02-29 DIAGNOSIS — L03115 Cellulitis of right lower limb: Secondary | ICD-10-CM | POA: Diagnosis not present

## 2024-02-29 DIAGNOSIS — F32A Depression, unspecified: Secondary | ICD-10-CM | POA: Diagnosis not present

## 2024-02-29 DIAGNOSIS — F419 Anxiety disorder, unspecified: Secondary | ICD-10-CM | POA: Diagnosis not present

## 2024-02-29 DIAGNOSIS — I872 Venous insufficiency (chronic) (peripheral): Secondary | ICD-10-CM | POA: Diagnosis not present

## 2024-02-29 DIAGNOSIS — I890 Lymphedema, not elsewhere classified: Secondary | ICD-10-CM | POA: Diagnosis not present

## 2024-02-29 DIAGNOSIS — L97811 Non-pressure chronic ulcer of other part of right lower leg limited to breakdown of skin: Secondary | ICD-10-CM | POA: Diagnosis not present

## 2024-02-29 DIAGNOSIS — J45909 Unspecified asthma, uncomplicated: Secondary | ICD-10-CM | POA: Diagnosis not present

## 2024-02-29 DIAGNOSIS — L97821 Non-pressure chronic ulcer of other part of left lower leg limited to breakdown of skin: Secondary | ICD-10-CM | POA: Diagnosis not present

## 2024-02-29 DIAGNOSIS — D649 Anemia, unspecified: Secondary | ICD-10-CM | POA: Diagnosis not present

## 2024-02-29 MED ORDER — AMMONIUM LACTATE 12 % EX CREA: 1.0000 | TOPICAL_CREAM | CUTANEOUS | 0 refills | Status: AC | PRN

## 2024-02-29 NOTE — Telephone Encounter (Signed)
 Ok for amlactin cream - done erx

## 2024-02-29 NOTE — Telephone Encounter (Signed)
 Called and lvm on secure line in regard to the cream that was sent in

## 2024-02-29 NOTE — Telephone Encounter (Signed)
 Copied from CRM #[id number]. Topic: Clinical - Medication Question >> Feb 29, 2024 11:11 AM Charolett L wrote: Reason for CRM: tasha from [facility] is requesting a itching/dryness cream for patient because under the wraps are really dry [id number]#[phone] secure line

## 2024-02-29 NOTE — Telephone Encounter (Signed)
 Please send in cream for patient

## 2024-03-04 DIAGNOSIS — I872 Venous insufficiency (chronic) (peripheral): Secondary | ICD-10-CM | POA: Diagnosis not present

## 2024-03-04 DIAGNOSIS — F32A Depression, unspecified: Secondary | ICD-10-CM | POA: Diagnosis not present

## 2024-03-04 DIAGNOSIS — I890 Lymphedema, not elsewhere classified: Secondary | ICD-10-CM | POA: Diagnosis not present

## 2024-03-04 DIAGNOSIS — L97811 Non-pressure chronic ulcer of other part of right lower leg limited to breakdown of skin: Secondary | ICD-10-CM | POA: Diagnosis not present

## 2024-03-04 DIAGNOSIS — D649 Anemia, unspecified: Secondary | ICD-10-CM | POA: Diagnosis not present

## 2024-03-04 DIAGNOSIS — L97821 Non-pressure chronic ulcer of other part of left lower leg limited to breakdown of skin: Secondary | ICD-10-CM | POA: Diagnosis not present

## 2024-03-04 DIAGNOSIS — L03115 Cellulitis of right lower limb: Secondary | ICD-10-CM | POA: Diagnosis not present

## 2024-03-04 DIAGNOSIS — F419 Anxiety disorder, unspecified: Secondary | ICD-10-CM | POA: Diagnosis not present

## 2024-03-04 DIAGNOSIS — J45909 Unspecified asthma, uncomplicated: Secondary | ICD-10-CM | POA: Diagnosis not present

## 2024-03-04 NOTE — Telephone Encounter (Unsigned)
 Copied from CRM #[id number]. Topic: Clinical - Home Health Verbal Orders >> Mar 04, 2024  4:41 PM Roselie BROCKS wrote: Caller/Agency: [facility] Lorenza Rushing Number: [phone] Service Requested: Physical Therapy Frequency:patient was on bi lateal compression wraps due to fluid ,not longer weeping, no open areas as of today ,  Any new concerns about the patient? Yes, Wants to know if should continue compressions , Home health doesn't think she needs them anymore.  Prescribed lotion is helping  Please advise

## 2024-03-04 NOTE — Telephone Encounter (Signed)
 Ok to hold on compression wraps for now and continue to follow, thanks

## 2024-03-06 NOTE — Telephone Encounter (Signed)
 Called and gave verbals.

## 2024-03-27 ENCOUNTER — Telehealth: Payer: Self-pay

## 2024-03-27 NOTE — Telephone Encounter (Signed)
 Unfortunately I am not able to address this question, but pt can be re-directed to calling her Home Health agency to check on status of further services.  Thanks

## 2024-03-27 NOTE — Telephone Encounter (Signed)
 Copied from CRM #[id number]. Topic: Clinical - Medical Advice >> Mar 26, 2024  4:42 PM [name] wrote: Reason for CRM: Patient stated that the nurse came by but both her and her husband was sick. Patient stated that the nurse from [facility] didn't get back in touch with her after that. She wanted to know the status of her home health services. She also wanted to know if she should get compression stockings as well She stated that it has been great being a patient of Dr. Norleen and hope he has a great holiday

## 2024-04-03 ENCOUNTER — Telehealth: Payer: Self-pay | Admitting: Internal Medicine

## 2024-04-03 NOTE — Telephone Encounter (Signed)
 Copied from CRM #[id number]. Topic: Clinical - Medical Advice >> Apr 03, 2024  3:20 PM Ashley R wrote: Reason for CRM: requesting callback re: [facility] orders. Having difficulty with them as she is receiving no communication.

## 2024-04-07 ENCOUNTER — Telehealth: Payer: Self-pay

## 2024-04-07 NOTE — Telephone Encounter (Signed)
 Copied from CRM #[id number]. Topic: Clinical - Medical Advice >> Mar 26, 2024  4:42 PM [name] wrote: Reason for CRM: Patient stated that the nurse came by but both her and her husband was sick. Patient stated that the nurse from [facility] didn't get back in touch with her after that. She wanted to know the status of her home health services. She also wanted to know if she should get compression stockings as well She stated that it has been great being a patient of Dr. Norleen and hope he has a great holiday >> Apr 07, 2024 12:15 PM [name] wrote: patient called wanting to know if she need to get the compression socks patient stated has not heard from anyone in regards to this. I called [facility] and Diane told me that the clinical team is at lunch and to leave a message. I relayed the message to her and she stated she will call back and want to speak to the doctor personally because she is leaving to many messages. She said they are not doing their job and she want to speak to the head nurse. I told her the office will give her a call back today before three  [phone]

## 2024-04-07 NOTE — Telephone Encounter (Signed)
 Ok to let pt know that I would not be able to tell her about the [facility] status;  I imagine that they will be back since the reason was illness.  Compression stockings would be great, and I believe are still available OTC for $4 at [facility]     thanks

## 2024-04-08 NOTE — Telephone Encounter (Signed)
 Ok this is noted,   no new orders, thanks

## 2024-04-08 NOTE — Telephone Encounter (Signed)
 I have called and spoke with Pt in regards to this in another phone encounter.

## 2024-04-29 ENCOUNTER — Telehealth: Payer: Self-pay

## 2024-04-29 NOTE — Telephone Encounter (Signed)
 Copied from CRM [id number]. Topic: Clinical - Medication Question >> Apr 29, 2024 12:00 PM Bethany Lopez wrote: Reason for CRM: Patient called in and would like to know what is the procedure bee with medication refills since they will not be able to see the new provider until July . Would like a callback

## 2024-05-01 NOTE — Telephone Encounter (Signed)
 Called and unable to leave voicemail if Pt calls back please let her know , when refills are needed she may call the office and another provider will send them in.

## 2024-05-08 ENCOUNTER — Other Ambulatory Visit: Payer: Self-pay | Admitting: Internal Medicine

## 2024-05-08 MED ORDER — AMLODIPINE BESYLATE 5 MG PO TABS: 5.0000 mg | ORAL_TABLET | Freq: Every day | ORAL | 0 refills | Status: AC

## 2024-05-08 NOTE — Telephone Encounter (Signed)
 Copied from CRM #[id number]. Topic: Clinical - Medication Refill >> May 08, 2024  8:22 AM Mercedes MATSU wrote: Medication: amLODipine  (NORVASC ) 5 MG tablet  Has the patient contacted their pharmacy? Yes (Agent: If no, request that the patient contact the pharmacy for the refill. If patient does not wish to contact the pharmacy document the reason why and proceed with request.) (Agent: If yes, when and what did the pharmacy advise?)  This is the patient's preferred pharmacy:  [facility] [address] [place] - [address] [place] [address] Phone: [phone] Fax: [phone]  Is this the correct pharmacy for this prescription? Yes If no, delete pharmacy and type the correct one.   Has the prescription been filled recently? Yes  Is the patient out of the medication? Yes  Has the patient been seen for an appointment in the last year OR does the patient have an upcoming appointment? Yes  Can we respond through MyChart? Yes  Agent: Please be advised that Rx refills may take up to 3 business days. We ask that you follow-up with your pharmacy.

## 2024-06-26 ENCOUNTER — Ambulatory Visit

## 2024-10-15 ENCOUNTER — Encounter: Admitting: Family Medicine
